# Patient Record
Sex: Male | Born: 1935
Health system: Southern US, Community
[De-identification: ages and names within clinical notes are randomized; demographics above are authoritative.]

## PROBLEM LIST (undated history)

## (undated) DIAGNOSIS — G4733 Obstructive sleep apnea (adult) (pediatric): Secondary | ICD-10-CM

## (undated) DIAGNOSIS — K5790 Diverticulosis of intestine, part unspecified, without perforation or abscess without bleeding: Secondary | ICD-10-CM

## (undated) DIAGNOSIS — I428 Other cardiomyopathies: Secondary | ICD-10-CM

## (undated) DIAGNOSIS — I519 Heart disease, unspecified: Secondary | ICD-10-CM

## (undated) DIAGNOSIS — I509 Heart failure, unspecified: Secondary | ICD-10-CM

## (undated) DIAGNOSIS — I82409 Acute embolism and thrombosis of unspecified deep veins of unspecified lower extremity: Secondary | ICD-10-CM

## (undated) DIAGNOSIS — E119 Type 2 diabetes mellitus without complications: Secondary | ICD-10-CM

## (undated) DIAGNOSIS — I639 Cerebral infarction, unspecified: Secondary | ICD-10-CM

## (undated) DIAGNOSIS — E785 Hyperlipidemia, unspecified: Secondary | ICD-10-CM

## (undated) DIAGNOSIS — L089 Local infection of the skin and subcutaneous tissue, unspecified: Secondary | ICD-10-CM

## (undated) DIAGNOSIS — N200 Calculus of kidney: Secondary | ICD-10-CM

## (undated) DIAGNOSIS — G459 Transient cerebral ischemic attack, unspecified: Secondary | ICD-10-CM

## (undated) HISTORY — DX: Calculus of kidney: N20.0

## (undated) HISTORY — DX: Cerebral infarction, unspecified: I63.9

## (undated) HISTORY — DX: Hyperlipidemia, unspecified: E78.5

## (undated) HISTORY — DX: Heart failure, unspecified: I50.9

## (undated) HISTORY — DX: Heart disease, unspecified: I51.9

## (undated) HISTORY — DX: Local infection of the skin and subcutaneous tissue, unspecified: L08.9

## (undated) HISTORY — PX: ROTATOR CUFF REPAIR: SHX139

## (undated) HISTORY — DX: Diverticulosis of intestine, part unspecified, without perforation or abscess without bleeding: K57.90

---

## 2000-11-17 ENCOUNTER — Ambulatory Visit (HOSPITAL_COMMUNITY): Admission: RE | Admit: 2000-11-17 | Discharge: 2000-11-17 | Payer: Self-pay | Admitting: Endocrinology

## 2002-09-02 ENCOUNTER — Ambulatory Visit (HOSPITAL_COMMUNITY): Admission: RE | Admit: 2002-09-02 | Discharge: 2002-09-02 | Payer: Self-pay | Admitting: Endocrinology

## 2006-07-14 DIAGNOSIS — G459 Transient cerebral ischemic attack, unspecified: Secondary | ICD-10-CM

## 2006-07-14 HISTORY — DX: Transient cerebral ischemic attack, unspecified: G45.9

## 2007-01-26 ENCOUNTER — Encounter: Admission: RE | Admit: 2007-01-26 | Discharge: 2007-01-26 | Payer: Self-pay | Admitting: Endocrinology

## 2007-02-03 ENCOUNTER — Encounter (INDEPENDENT_AMBULATORY_CARE_PROVIDER_SITE_OTHER): Payer: Self-pay | Admitting: Endocrinology

## 2007-02-03 ENCOUNTER — Ambulatory Visit (HOSPITAL_COMMUNITY): Admission: RE | Admit: 2007-02-03 | Discharge: 2007-02-03 | Payer: Self-pay | Admitting: Endocrinology

## 2007-02-03 ENCOUNTER — Ambulatory Visit: Payer: Self-pay | Admitting: Vascular Surgery

## 2009-02-22 ENCOUNTER — Emergency Department (HOSPITAL_COMMUNITY): Admission: EM | Admit: 2009-02-22 | Discharge: 2009-02-22 | Payer: Self-pay | Admitting: Emergency Medicine

## 2009-02-24 ENCOUNTER — Emergency Department (HOSPITAL_COMMUNITY): Admission: EM | Admit: 2009-02-24 | Discharge: 2009-02-24 | Payer: Self-pay | Admitting: Emergency Medicine

## 2009-03-09 ENCOUNTER — Emergency Department (HOSPITAL_COMMUNITY): Admission: EM | Admit: 2009-03-09 | Discharge: 2009-03-09 | Payer: Self-pay | Admitting: Emergency Medicine

## 2011-07-15 HISTORY — PX: COLONOSCOPY: SHX174

## 2011-08-20 DIAGNOSIS — Z7901 Long term (current) use of anticoagulants: Secondary | ICD-10-CM | POA: Diagnosis not present

## 2011-08-20 DIAGNOSIS — E119 Type 2 diabetes mellitus without complications: Secondary | ICD-10-CM | POA: Diagnosis not present

## 2011-08-20 DIAGNOSIS — E669 Obesity, unspecified: Secondary | ICD-10-CM | POA: Diagnosis not present

## 2011-08-20 DIAGNOSIS — E785 Hyperlipidemia, unspecified: Secondary | ICD-10-CM | POA: Diagnosis not present

## 2011-09-09 DIAGNOSIS — I82409 Acute embolism and thrombosis of unspecified deep veins of unspecified lower extremity: Secondary | ICD-10-CM | POA: Diagnosis not present

## 2011-09-09 DIAGNOSIS — Z7901 Long term (current) use of anticoagulants: Secondary | ICD-10-CM | POA: Diagnosis not present

## 2011-10-07 DIAGNOSIS — I82409 Acute embolism and thrombosis of unspecified deep veins of unspecified lower extremity: Secondary | ICD-10-CM | POA: Diagnosis not present

## 2011-10-07 DIAGNOSIS — Z7901 Long term (current) use of anticoagulants: Secondary | ICD-10-CM | POA: Diagnosis not present

## 2011-10-24 DIAGNOSIS — H2510 Age-related nuclear cataract, unspecified eye: Secondary | ICD-10-CM | POA: Diagnosis not present

## 2011-10-24 DIAGNOSIS — H524 Presbyopia: Secondary | ICD-10-CM | POA: Diagnosis not present

## 2011-10-24 DIAGNOSIS — H40059 Ocular hypertension, unspecified eye: Secondary | ICD-10-CM | POA: Diagnosis not present

## 2011-10-24 DIAGNOSIS — H52209 Unspecified astigmatism, unspecified eye: Secondary | ICD-10-CM | POA: Diagnosis not present

## 2011-11-04 DIAGNOSIS — I82409 Acute embolism and thrombosis of unspecified deep veins of unspecified lower extremity: Secondary | ICD-10-CM | POA: Diagnosis not present

## 2011-11-04 DIAGNOSIS — Z7901 Long term (current) use of anticoagulants: Secondary | ICD-10-CM | POA: Diagnosis not present

## 2011-12-11 DIAGNOSIS — Z7901 Long term (current) use of anticoagulants: Secondary | ICD-10-CM | POA: Diagnosis not present

## 2011-12-11 DIAGNOSIS — I82409 Acute embolism and thrombosis of unspecified deep veins of unspecified lower extremity: Secondary | ICD-10-CM | POA: Diagnosis not present

## 2012-01-13 DIAGNOSIS — Z7901 Long term (current) use of anticoagulants: Secondary | ICD-10-CM | POA: Diagnosis not present

## 2012-01-13 DIAGNOSIS — I82409 Acute embolism and thrombosis of unspecified deep veins of unspecified lower extremity: Secondary | ICD-10-CM | POA: Diagnosis not present

## 2012-02-16 DIAGNOSIS — E119 Type 2 diabetes mellitus without complications: Secondary | ICD-10-CM | POA: Diagnosis not present

## 2012-02-16 DIAGNOSIS — Z125 Encounter for screening for malignant neoplasm of prostate: Secondary | ICD-10-CM | POA: Diagnosis not present

## 2012-02-16 DIAGNOSIS — R82998 Other abnormal findings in urine: Secondary | ICD-10-CM | POA: Diagnosis not present

## 2012-02-16 DIAGNOSIS — E785 Hyperlipidemia, unspecified: Secondary | ICD-10-CM | POA: Diagnosis not present

## 2012-02-16 DIAGNOSIS — Z7901 Long term (current) use of anticoagulants: Secondary | ICD-10-CM | POA: Diagnosis not present

## 2012-02-23 DIAGNOSIS — Z1212 Encounter for screening for malignant neoplasm of rectum: Secondary | ICD-10-CM | POA: Diagnosis not present

## 2012-02-23 DIAGNOSIS — G459 Transient cerebral ischemic attack, unspecified: Secondary | ICD-10-CM | POA: Diagnosis not present

## 2012-02-23 DIAGNOSIS — J309 Allergic rhinitis, unspecified: Secondary | ICD-10-CM | POA: Diagnosis not present

## 2012-02-23 DIAGNOSIS — Z125 Encounter for screening for malignant neoplasm of prostate: Secondary | ICD-10-CM | POA: Diagnosis not present

## 2012-02-23 DIAGNOSIS — Z0000 Encounter for general adult medical examination without abnormal findings: Secondary | ICD-10-CM | POA: Diagnosis not present

## 2012-03-18 DIAGNOSIS — Z23 Encounter for immunization: Secondary | ICD-10-CM | POA: Diagnosis not present

## 2012-03-24 DIAGNOSIS — G459 Transient cerebral ischemic attack, unspecified: Secondary | ICD-10-CM | POA: Diagnosis not present

## 2012-03-24 DIAGNOSIS — I82409 Acute embolism and thrombosis of unspecified deep veins of unspecified lower extremity: Secondary | ICD-10-CM | POA: Diagnosis not present

## 2012-03-24 DIAGNOSIS — Z7901 Long term (current) use of anticoagulants: Secondary | ICD-10-CM | POA: Diagnosis not present

## 2012-03-31 DIAGNOSIS — B889 Infestation, unspecified: Secondary | ICD-10-CM | POA: Diagnosis not present

## 2012-03-31 DIAGNOSIS — R195 Other fecal abnormalities: Secondary | ICD-10-CM | POA: Diagnosis not present

## 2012-04-05 DIAGNOSIS — R195 Other fecal abnormalities: Secondary | ICD-10-CM | POA: Diagnosis not present

## 2012-04-05 DIAGNOSIS — B889 Infestation, unspecified: Secondary | ICD-10-CM | POA: Diagnosis not present

## 2012-04-21 DIAGNOSIS — Z7901 Long term (current) use of anticoagulants: Secondary | ICD-10-CM | POA: Diagnosis not present

## 2012-04-21 DIAGNOSIS — I82409 Acute embolism and thrombosis of unspecified deep veins of unspecified lower extremity: Secondary | ICD-10-CM | POA: Diagnosis not present

## 2012-04-27 DIAGNOSIS — R195 Other fecal abnormalities: Secondary | ICD-10-CM | POA: Diagnosis not present

## 2012-04-27 DIAGNOSIS — K222 Esophageal obstruction: Secondary | ICD-10-CM | POA: Diagnosis not present

## 2012-04-27 DIAGNOSIS — K319 Disease of stomach and duodenum, unspecified: Secondary | ICD-10-CM | POA: Diagnosis not present

## 2012-04-27 DIAGNOSIS — D126 Benign neoplasm of colon, unspecified: Secondary | ICD-10-CM | POA: Diagnosis not present

## 2012-05-26 DIAGNOSIS — Z7901 Long term (current) use of anticoagulants: Secondary | ICD-10-CM | POA: Diagnosis not present

## 2012-05-26 DIAGNOSIS — I82409 Acute embolism and thrombosis of unspecified deep veins of unspecified lower extremity: Secondary | ICD-10-CM | POA: Diagnosis not present

## 2012-05-26 DIAGNOSIS — G459 Transient cerebral ischemic attack, unspecified: Secondary | ICD-10-CM | POA: Diagnosis not present

## 2012-06-29 DIAGNOSIS — I82409 Acute embolism and thrombosis of unspecified deep veins of unspecified lower extremity: Secondary | ICD-10-CM | POA: Diagnosis not present

## 2012-06-29 DIAGNOSIS — G459 Transient cerebral ischemic attack, unspecified: Secondary | ICD-10-CM | POA: Diagnosis not present

## 2012-06-29 DIAGNOSIS — Z7901 Long term (current) use of anticoagulants: Secondary | ICD-10-CM | POA: Diagnosis not present

## 2012-08-03 DIAGNOSIS — Z7901 Long term (current) use of anticoagulants: Secondary | ICD-10-CM | POA: Diagnosis not present

## 2012-08-03 DIAGNOSIS — I82409 Acute embolism and thrombosis of unspecified deep veins of unspecified lower extremity: Secondary | ICD-10-CM | POA: Diagnosis not present

## 2012-08-03 DIAGNOSIS — G459 Transient cerebral ischemic attack, unspecified: Secondary | ICD-10-CM | POA: Diagnosis not present

## 2012-09-08 DIAGNOSIS — Z7901 Long term (current) use of anticoagulants: Secondary | ICD-10-CM | POA: Diagnosis not present

## 2012-09-08 DIAGNOSIS — I82409 Acute embolism and thrombosis of unspecified deep veins of unspecified lower extremity: Secondary | ICD-10-CM | POA: Diagnosis not present

## 2012-09-21 DIAGNOSIS — E669 Obesity, unspecified: Secondary | ICD-10-CM | POA: Diagnosis not present

## 2012-09-21 DIAGNOSIS — E785 Hyperlipidemia, unspecified: Secondary | ICD-10-CM | POA: Diagnosis not present

## 2012-09-21 DIAGNOSIS — Z7901 Long term (current) use of anticoagulants: Secondary | ICD-10-CM | POA: Diagnosis not present

## 2012-09-21 DIAGNOSIS — E119 Type 2 diabetes mellitus without complications: Secondary | ICD-10-CM | POA: Diagnosis not present

## 2012-09-21 DIAGNOSIS — K921 Melena: Secondary | ICD-10-CM | POA: Diagnosis not present

## 2012-10-06 DIAGNOSIS — Z7901 Long term (current) use of anticoagulants: Secondary | ICD-10-CM | POA: Diagnosis not present

## 2012-10-06 DIAGNOSIS — I82409 Acute embolism and thrombosis of unspecified deep veins of unspecified lower extremity: Secondary | ICD-10-CM | POA: Diagnosis not present

## 2012-10-06 DIAGNOSIS — G459 Transient cerebral ischemic attack, unspecified: Secondary | ICD-10-CM | POA: Diagnosis not present

## 2012-11-10 DIAGNOSIS — Z7901 Long term (current) use of anticoagulants: Secondary | ICD-10-CM | POA: Diagnosis not present

## 2012-11-10 DIAGNOSIS — I82409 Acute embolism and thrombosis of unspecified deep veins of unspecified lower extremity: Secondary | ICD-10-CM | POA: Diagnosis not present

## 2012-12-15 DIAGNOSIS — I82409 Acute embolism and thrombosis of unspecified deep veins of unspecified lower extremity: Secondary | ICD-10-CM | POA: Diagnosis not present

## 2012-12-15 DIAGNOSIS — Z7901 Long term (current) use of anticoagulants: Secondary | ICD-10-CM | POA: Diagnosis not present

## 2013-01-18 DIAGNOSIS — Z7901 Long term (current) use of anticoagulants: Secondary | ICD-10-CM | POA: Diagnosis not present

## 2013-01-18 DIAGNOSIS — I82409 Acute embolism and thrombosis of unspecified deep veins of unspecified lower extremity: Secondary | ICD-10-CM | POA: Diagnosis not present

## 2013-02-17 DIAGNOSIS — E1169 Type 2 diabetes mellitus with other specified complication: Secondary | ICD-10-CM | POA: Diagnosis not present

## 2013-02-17 DIAGNOSIS — Z7901 Long term (current) use of anticoagulants: Secondary | ICD-10-CM | POA: Diagnosis not present

## 2013-02-17 DIAGNOSIS — Z125 Encounter for screening for malignant neoplasm of prostate: Secondary | ICD-10-CM | POA: Diagnosis not present

## 2013-02-17 DIAGNOSIS — R82998 Other abnormal findings in urine: Secondary | ICD-10-CM | POA: Diagnosis not present

## 2013-02-17 DIAGNOSIS — E785 Hyperlipidemia, unspecified: Secondary | ICD-10-CM | POA: Diagnosis not present

## 2013-03-02 DIAGNOSIS — Z1212 Encounter for screening for malignant neoplasm of rectum: Secondary | ICD-10-CM | POA: Diagnosis not present

## 2013-03-04 DIAGNOSIS — Z7901 Long term (current) use of anticoagulants: Secondary | ICD-10-CM | POA: Diagnosis not present

## 2013-03-04 DIAGNOSIS — Z0000 Encounter for general adult medical examination without abnormal findings: Secondary | ICD-10-CM | POA: Diagnosis not present

## 2013-03-04 DIAGNOSIS — I82409 Acute embolism and thrombosis of unspecified deep veins of unspecified lower extremity: Secondary | ICD-10-CM | POA: Diagnosis not present

## 2013-03-04 DIAGNOSIS — G459 Transient cerebral ischemic attack, unspecified: Secondary | ICD-10-CM | POA: Diagnosis not present

## 2013-03-04 DIAGNOSIS — E669 Obesity, unspecified: Secondary | ICD-10-CM | POA: Diagnosis not present

## 2013-03-04 DIAGNOSIS — E785 Hyperlipidemia, unspecified: Secondary | ICD-10-CM | POA: Diagnosis not present

## 2013-03-04 DIAGNOSIS — Z1331 Encounter for screening for depression: Secondary | ICD-10-CM | POA: Diagnosis not present

## 2013-03-04 DIAGNOSIS — J309 Allergic rhinitis, unspecified: Secondary | ICD-10-CM | POA: Diagnosis not present

## 2013-03-04 DIAGNOSIS — E119 Type 2 diabetes mellitus without complications: Secondary | ICD-10-CM | POA: Diagnosis not present

## 2013-03-04 DIAGNOSIS — N401 Enlarged prostate with lower urinary tract symptoms: Secondary | ICD-10-CM | POA: Diagnosis not present

## 2013-03-23 DIAGNOSIS — I82409 Acute embolism and thrombosis of unspecified deep veins of unspecified lower extremity: Secondary | ICD-10-CM | POA: Diagnosis not present

## 2013-03-23 DIAGNOSIS — Z7901 Long term (current) use of anticoagulants: Secondary | ICD-10-CM | POA: Diagnosis not present

## 2013-03-30 DIAGNOSIS — Z23 Encounter for immunization: Secondary | ICD-10-CM | POA: Diagnosis not present

## 2013-05-10 DIAGNOSIS — I82409 Acute embolism and thrombosis of unspecified deep veins of unspecified lower extremity: Secondary | ICD-10-CM | POA: Diagnosis not present

## 2013-05-10 DIAGNOSIS — Z7901 Long term (current) use of anticoagulants: Secondary | ICD-10-CM | POA: Diagnosis not present

## 2013-06-14 DIAGNOSIS — I82409 Acute embolism and thrombosis of unspecified deep veins of unspecified lower extremity: Secondary | ICD-10-CM | POA: Diagnosis not present

## 2013-06-14 DIAGNOSIS — Z7901 Long term (current) use of anticoagulants: Secondary | ICD-10-CM | POA: Diagnosis not present

## 2013-07-21 DIAGNOSIS — Z7901 Long term (current) use of anticoagulants: Secondary | ICD-10-CM | POA: Diagnosis not present

## 2013-07-21 DIAGNOSIS — I82409 Acute embolism and thrombosis of unspecified deep veins of unspecified lower extremity: Secondary | ICD-10-CM | POA: Diagnosis not present

## 2013-08-23 DIAGNOSIS — I82409 Acute embolism and thrombosis of unspecified deep veins of unspecified lower extremity: Secondary | ICD-10-CM | POA: Diagnosis not present

## 2013-08-23 DIAGNOSIS — Z7901 Long term (current) use of anticoagulants: Secondary | ICD-10-CM | POA: Diagnosis not present

## 2013-09-05 DIAGNOSIS — N401 Enlarged prostate with lower urinary tract symptoms: Secondary | ICD-10-CM | POA: Diagnosis not present

## 2013-09-05 DIAGNOSIS — E785 Hyperlipidemia, unspecified: Secondary | ICD-10-CM | POA: Diagnosis not present

## 2013-09-05 DIAGNOSIS — E669 Obesity, unspecified: Secondary | ICD-10-CM | POA: Diagnosis not present

## 2013-09-05 DIAGNOSIS — I6990 Unspecified sequelae of unspecified cerebrovascular disease: Secondary | ICD-10-CM | POA: Diagnosis not present

## 2013-09-05 DIAGNOSIS — Z6835 Body mass index (BMI) 35.0-35.9, adult: Secondary | ICD-10-CM | POA: Diagnosis not present

## 2013-09-05 DIAGNOSIS — Z7901 Long term (current) use of anticoagulants: Secondary | ICD-10-CM | POA: Diagnosis not present

## 2013-09-05 DIAGNOSIS — E119 Type 2 diabetes mellitus without complications: Secondary | ICD-10-CM | POA: Diagnosis not present

## 2013-09-05 DIAGNOSIS — J309 Allergic rhinitis, unspecified: Secondary | ICD-10-CM | POA: Diagnosis not present

## 2013-09-20 DIAGNOSIS — Z7901 Long term (current) use of anticoagulants: Secondary | ICD-10-CM | POA: Diagnosis not present

## 2013-09-20 DIAGNOSIS — I82409 Acute embolism and thrombosis of unspecified deep veins of unspecified lower extremity: Secondary | ICD-10-CM | POA: Diagnosis not present

## 2013-09-20 DIAGNOSIS — I6990 Unspecified sequelae of unspecified cerebrovascular disease: Secondary | ICD-10-CM | POA: Diagnosis not present

## 2013-10-19 DIAGNOSIS — I6990 Unspecified sequelae of unspecified cerebrovascular disease: Secondary | ICD-10-CM | POA: Diagnosis not present

## 2013-10-19 DIAGNOSIS — Z7901 Long term (current) use of anticoagulants: Secondary | ICD-10-CM | POA: Diagnosis not present

## 2013-10-19 DIAGNOSIS — I82409 Acute embolism and thrombosis of unspecified deep veins of unspecified lower extremity: Secondary | ICD-10-CM | POA: Diagnosis not present

## 2013-11-16 DIAGNOSIS — I6990 Unspecified sequelae of unspecified cerebrovascular disease: Secondary | ICD-10-CM | POA: Diagnosis not present

## 2013-11-16 DIAGNOSIS — Z7901 Long term (current) use of anticoagulants: Secondary | ICD-10-CM | POA: Diagnosis not present

## 2013-11-16 DIAGNOSIS — I82409 Acute embolism and thrombosis of unspecified deep veins of unspecified lower extremity: Secondary | ICD-10-CM | POA: Diagnosis not present

## 2013-12-21 DIAGNOSIS — Z7901 Long term (current) use of anticoagulants: Secondary | ICD-10-CM | POA: Diagnosis not present

## 2013-12-21 DIAGNOSIS — R82998 Other abnormal findings in urine: Secondary | ICD-10-CM | POA: Diagnosis not present

## 2013-12-21 DIAGNOSIS — I82409 Acute embolism and thrombosis of unspecified deep veins of unspecified lower extremity: Secondary | ICD-10-CM | POA: Diagnosis not present

## 2014-01-04 DIAGNOSIS — I82409 Acute embolism and thrombosis of unspecified deep veins of unspecified lower extremity: Secondary | ICD-10-CM | POA: Diagnosis not present

## 2014-01-04 DIAGNOSIS — N200 Calculus of kidney: Secondary | ICD-10-CM | POA: Diagnosis not present

## 2014-01-04 DIAGNOSIS — Z7901 Long term (current) use of anticoagulants: Secondary | ICD-10-CM | POA: Diagnosis not present

## 2014-01-05 DIAGNOSIS — N200 Calculus of kidney: Secondary | ICD-10-CM | POA: Diagnosis not present

## 2014-01-11 ENCOUNTER — Other Ambulatory Visit: Payer: Self-pay | Admitting: Internal Medicine

## 2014-01-11 DIAGNOSIS — N200 Calculus of kidney: Secondary | ICD-10-CM

## 2014-01-12 ENCOUNTER — Ambulatory Visit
Admission: RE | Admit: 2014-01-12 | Discharge: 2014-01-12 | Disposition: A | Discharge status: Home / Self Care | Payer: Medicare Other | Source: Ambulatory Visit | Attending: Internal Medicine | Admitting: Internal Medicine

## 2014-01-12 DIAGNOSIS — N200 Calculus of kidney: Secondary | ICD-10-CM

## 2014-01-12 DIAGNOSIS — K5730 Diverticulosis of large intestine without perforation or abscess without bleeding: Secondary | ICD-10-CM | POA: Diagnosis not present

## 2014-01-19 DIAGNOSIS — Z7901 Long term (current) use of anticoagulants: Secondary | ICD-10-CM | POA: Diagnosis not present

## 2014-01-19 DIAGNOSIS — I82409 Acute embolism and thrombosis of unspecified deep veins of unspecified lower extremity: Secondary | ICD-10-CM | POA: Diagnosis not present

## 2014-01-19 DIAGNOSIS — I6990 Unspecified sequelae of unspecified cerebrovascular disease: Secondary | ICD-10-CM | POA: Diagnosis not present

## 2014-03-06 DIAGNOSIS — E119 Type 2 diabetes mellitus without complications: Secondary | ICD-10-CM | POA: Diagnosis not present

## 2014-03-06 DIAGNOSIS — Z125 Encounter for screening for malignant neoplasm of prostate: Secondary | ICD-10-CM | POA: Diagnosis not present

## 2014-03-06 DIAGNOSIS — Z7901 Long term (current) use of anticoagulants: Secondary | ICD-10-CM | POA: Diagnosis not present

## 2014-03-06 DIAGNOSIS — I82409 Acute embolism and thrombosis of unspecified deep veins of unspecified lower extremity: Secondary | ICD-10-CM | POA: Diagnosis not present

## 2014-03-06 DIAGNOSIS — E785 Hyperlipidemia, unspecified: Secondary | ICD-10-CM | POA: Diagnosis not present

## 2014-03-10 DIAGNOSIS — Z1212 Encounter for screening for malignant neoplasm of rectum: Secondary | ICD-10-CM | POA: Diagnosis not present

## 2014-03-13 DIAGNOSIS — Z0000 Encounter for general adult medical examination without abnormal findings: Secondary | ICD-10-CM | POA: Diagnosis not present

## 2014-03-13 DIAGNOSIS — E785 Hyperlipidemia, unspecified: Secondary | ICD-10-CM | POA: Diagnosis not present

## 2014-03-13 DIAGNOSIS — Z7901 Long term (current) use of anticoagulants: Secondary | ICD-10-CM | POA: Diagnosis not present

## 2014-03-13 DIAGNOSIS — E669 Obesity, unspecified: Secondary | ICD-10-CM | POA: Diagnosis not present

## 2014-03-13 DIAGNOSIS — Z1331 Encounter for screening for depression: Secondary | ICD-10-CM | POA: Diagnosis not present

## 2014-03-13 DIAGNOSIS — I82409 Acute embolism and thrombosis of unspecified deep veins of unspecified lower extremity: Secondary | ICD-10-CM | POA: Diagnosis not present

## 2014-03-13 DIAGNOSIS — K5730 Diverticulosis of large intestine without perforation or abscess without bleeding: Secondary | ICD-10-CM | POA: Diagnosis not present

## 2014-03-13 DIAGNOSIS — E119 Type 2 diabetes mellitus without complications: Secondary | ICD-10-CM | POA: Diagnosis not present

## 2014-03-13 DIAGNOSIS — N200 Calculus of kidney: Secondary | ICD-10-CM | POA: Diagnosis not present

## 2014-04-03 DIAGNOSIS — Z23 Encounter for immunization: Secondary | ICD-10-CM | POA: Diagnosis not present

## 2014-04-11 DIAGNOSIS — I82409 Acute embolism and thrombosis of unspecified deep veins of unspecified lower extremity: Secondary | ICD-10-CM | POA: Diagnosis not present

## 2014-04-11 DIAGNOSIS — Z7901 Long term (current) use of anticoagulants: Secondary | ICD-10-CM | POA: Diagnosis not present

## 2014-05-10 DIAGNOSIS — Z7901 Long term (current) use of anticoagulants: Secondary | ICD-10-CM | POA: Diagnosis not present

## 2014-05-10 DIAGNOSIS — I82403 Acute embolism and thrombosis of unspecified deep veins of lower extremity, bilateral: Secondary | ICD-10-CM | POA: Diagnosis not present

## 2014-06-14 DIAGNOSIS — Z7901 Long term (current) use of anticoagulants: Secondary | ICD-10-CM | POA: Diagnosis not present

## 2014-06-14 DIAGNOSIS — I82403 Acute embolism and thrombosis of unspecified deep veins of lower extremity, bilateral: Secondary | ICD-10-CM | POA: Diagnosis not present

## 2014-07-20 DIAGNOSIS — I82403 Acute embolism and thrombosis of unspecified deep veins of lower extremity, bilateral: Secondary | ICD-10-CM | POA: Diagnosis not present

## 2014-07-20 DIAGNOSIS — Z7901 Long term (current) use of anticoagulants: Secondary | ICD-10-CM | POA: Diagnosis not present

## 2014-08-24 DIAGNOSIS — I82403 Acute embolism and thrombosis of unspecified deep veins of lower extremity, bilateral: Secondary | ICD-10-CM | POA: Diagnosis not present

## 2014-08-24 DIAGNOSIS — Z7901 Long term (current) use of anticoagulants: Secondary | ICD-10-CM | POA: Diagnosis not present

## 2014-09-11 DIAGNOSIS — Z1389 Encounter for screening for other disorder: Secondary | ICD-10-CM | POA: Diagnosis not present

## 2014-09-11 DIAGNOSIS — I6990 Unspecified sequelae of unspecified cerebrovascular disease: Secondary | ICD-10-CM | POA: Diagnosis not present

## 2014-09-11 DIAGNOSIS — Z6834 Body mass index (BMI) 34.0-34.9, adult: Secondary | ICD-10-CM | POA: Diagnosis not present

## 2014-09-11 DIAGNOSIS — N401 Enlarged prostate with lower urinary tract symptoms: Secondary | ICD-10-CM | POA: Diagnosis not present

## 2014-09-11 DIAGNOSIS — E785 Hyperlipidemia, unspecified: Secondary | ICD-10-CM | POA: Diagnosis not present

## 2014-09-11 DIAGNOSIS — E669 Obesity, unspecified: Secondary | ICD-10-CM | POA: Diagnosis not present

## 2014-09-11 DIAGNOSIS — Z7901 Long term (current) use of anticoagulants: Secondary | ICD-10-CM | POA: Diagnosis not present

## 2014-09-11 DIAGNOSIS — E119 Type 2 diabetes mellitus without complications: Secondary | ICD-10-CM | POA: Diagnosis not present

## 2014-09-26 DIAGNOSIS — I6990 Unspecified sequelae of unspecified cerebrovascular disease: Secondary | ICD-10-CM | POA: Diagnosis not present

## 2014-09-26 DIAGNOSIS — I82403 Acute embolism and thrombosis of unspecified deep veins of lower extremity, bilateral: Secondary | ICD-10-CM | POA: Diagnosis not present

## 2014-09-26 DIAGNOSIS — Z7901 Long term (current) use of anticoagulants: Secondary | ICD-10-CM | POA: Diagnosis not present

## 2014-10-31 DIAGNOSIS — I82403 Acute embolism and thrombosis of unspecified deep veins of lower extremity, bilateral: Secondary | ICD-10-CM | POA: Diagnosis not present

## 2014-10-31 DIAGNOSIS — Z7901 Long term (current) use of anticoagulants: Secondary | ICD-10-CM | POA: Diagnosis not present

## 2014-11-28 ENCOUNTER — Encounter (HOSPITAL_COMMUNITY): Payer: Self-pay

## 2014-11-28 ENCOUNTER — Emergency Department (HOSPITAL_COMMUNITY): Payer: Medicare Other

## 2014-11-28 ENCOUNTER — Inpatient Hospital Stay (HOSPITAL_COMMUNITY)
Admission: EM | Admit: 2014-11-28 | Discharge: 2014-12-02 | DRG: 287 | Disposition: A | Discharge status: Home / Self Care | Payer: Medicare Other | Attending: Internal Medicine | Admitting: Internal Medicine

## 2014-11-28 DIAGNOSIS — I82401 Acute embolism and thrombosis of unspecified deep veins of right lower extremity: Secondary | ICD-10-CM

## 2014-11-28 DIAGNOSIS — I5021 Acute systolic (congestive) heart failure: Secondary | ICD-10-CM | POA: Diagnosis not present

## 2014-11-28 DIAGNOSIS — R7989 Other specified abnormal findings of blood chemistry: Secondary | ICD-10-CM | POA: Insufficient documentation

## 2014-11-28 DIAGNOSIS — I82403 Acute embolism and thrombosis of unspecified deep veins of lower extremity, bilateral: Secondary | ICD-10-CM | POA: Diagnosis not present

## 2014-11-28 DIAGNOSIS — I252 Old myocardial infarction: Secondary | ICD-10-CM

## 2014-11-28 DIAGNOSIS — I429 Cardiomyopathy, unspecified: Secondary | ICD-10-CM | POA: Diagnosis present

## 2014-11-28 DIAGNOSIS — Z888 Allergy status to other drugs, medicaments and biological substances status: Secondary | ICD-10-CM

## 2014-11-28 DIAGNOSIS — I5043 Acute on chronic combined systolic (congestive) and diastolic (congestive) heart failure: Secondary | ICD-10-CM | POA: Diagnosis not present

## 2014-11-28 DIAGNOSIS — I472 Ventricular tachycardia: Secondary | ICD-10-CM | POA: Diagnosis present

## 2014-11-28 DIAGNOSIS — Z7982 Long term (current) use of aspirin: Secondary | ICD-10-CM

## 2014-11-28 DIAGNOSIS — Z8673 Personal history of transient ischemic attack (TIA), and cerebral infarction without residual deficits: Secondary | ICD-10-CM | POA: Diagnosis not present

## 2014-11-28 DIAGNOSIS — R778 Other specified abnormalities of plasma proteins: Secondary | ICD-10-CM | POA: Insufficient documentation

## 2014-11-28 DIAGNOSIS — I509 Heart failure, unspecified: Secondary | ICD-10-CM

## 2014-11-28 DIAGNOSIS — Z91041 Radiographic dye allergy status: Secondary | ICD-10-CM

## 2014-11-28 DIAGNOSIS — Z882 Allergy status to sulfonamides status: Secondary | ICD-10-CM | POA: Diagnosis not present

## 2014-11-28 DIAGNOSIS — Z86718 Personal history of other venous thrombosis and embolism: Secondary | ICD-10-CM | POA: Diagnosis not present

## 2014-11-28 DIAGNOSIS — I2510 Atherosclerotic heart disease of native coronary artery without angina pectoris: Secondary | ICD-10-CM | POA: Diagnosis present

## 2014-11-28 DIAGNOSIS — G459 Transient cerebral ischemic attack, unspecified: Secondary | ICD-10-CM | POA: Diagnosis not present

## 2014-11-28 DIAGNOSIS — E785 Hyperlipidemia, unspecified: Secondary | ICD-10-CM | POA: Diagnosis present

## 2014-11-28 DIAGNOSIS — R079 Chest pain, unspecified: Secondary | ICD-10-CM | POA: Diagnosis not present

## 2014-11-28 DIAGNOSIS — Z7901 Long term (current) use of anticoagulants: Secondary | ICD-10-CM

## 2014-11-28 DIAGNOSIS — E119 Type 2 diabetes mellitus without complications: Secondary | ICD-10-CM

## 2014-11-28 DIAGNOSIS — R0602 Shortness of breath: Secondary | ICD-10-CM | POA: Diagnosis not present

## 2014-11-28 DIAGNOSIS — I82409 Acute embolism and thrombosis of unspecified deep veins of unspecified lower extremity: Secondary | ICD-10-CM | POA: Diagnosis present

## 2014-11-28 DIAGNOSIS — I214 Non-ST elevation (NSTEMI) myocardial infarction: Secondary | ICD-10-CM | POA: Diagnosis not present

## 2014-11-28 DIAGNOSIS — I420 Dilated cardiomyopathy: Secondary | ICD-10-CM | POA: Diagnosis not present

## 2014-11-28 DIAGNOSIS — I5041 Acute combined systolic (congestive) and diastolic (congestive) heart failure: Secondary | ICD-10-CM | POA: Diagnosis not present

## 2014-11-28 HISTORY — DX: Type 2 diabetes mellitus without complications: E11.9

## 2014-11-28 HISTORY — DX: Acute embolism and thrombosis of unspecified deep veins of unspecified lower extremity: I82.409

## 2014-11-28 HISTORY — DX: Transient cerebral ischemic attack, unspecified: G45.9

## 2014-11-28 LAB — BASIC METABOLIC PANEL
Anion gap: 12 (ref 5–15)
BUN: 20 mg/dL (ref 6–20)
CO2: 23 mmol/L (ref 22–32)
Calcium: 9.3 mg/dL (ref 8.9–10.3)
Chloride: 103 mmol/L (ref 101–111)
Creatinine, Ser: 1.11 mg/dL (ref 0.61–1.24)
GFR calc Af Amer: >60 mL/min (ref >60)
GLUCOSE: 121 mg/dL — AB (ref 65–99)
POTASSIUM: 4.3 mmol/L (ref 3.5–5.1)
Sodium: 138 mmol/L (ref 135–145)

## 2014-11-28 LAB — CBC
HCT: 43.5 % (ref 39.0–52.0)
HEMOGLOBIN: 14.4 g/dL (ref 13.0–17.0)
MCH: 26.5 pg (ref 26.0–34.0)
MCHC: 33.1 g/dL (ref 30.0–36.0)
MCV: 80 fL (ref 78.0–100.0)
Platelets: 202 10*3/uL (ref 150–400)
RBC: 5.44 MIL/uL (ref 4.22–5.81)
RDW: 13.6 % (ref 11.5–15.5)
WBC: 7.5 10*3/uL (ref 4.0–10.5)

## 2014-11-28 LAB — PROTIME-INR
INR: 2.79 — ABNORMAL HIGH (ref 0.00–1.49)
PROTHROMBIN TIME: 29.6 s — AB (ref 11.6–15.2)

## 2014-11-28 LAB — I-STAT TROPONIN, ED: TROPONIN I, POC: 0.04 ng/mL (ref 0.00–0.08)

## 2014-11-28 LAB — BRAIN NATRIURETIC PEPTIDE: B Natriuretic Peptide: 732.6 pg/mL — ABNORMAL HIGH (ref 0.0–100.0)

## 2014-11-28 MED ORDER — FUROSEMIDE 10 MG/ML IJ SOLN
20.0000 mg | Freq: Once | INTRAMUSCULAR | Status: AC
  Administered 2014-11-28: 20 mg via INTRAVENOUS
  Filled 2014-11-28: qty 2

## 2014-11-28 NOTE — ED Provider Notes (Signed)
CSN: [id number]     Arrival date & time 11/28/14  1830 History   First MD Initiated Contact with Patient 11/28/14 2126     Chief Complaint  Patient presents with  . Shortness of Breath  . Chest Pain     Patient is a 80 y.o. male presenting with shortness of breath and chest pain. The history is provided by the patient. No language interpreter was used.  Shortness of Breath Associated symptoms: chest pain   Chest Pain Associated symptoms: shortness of breath    Michael Randolph presents for evaluation of SOB.  He reports 10 days of progressive SOB and DOE.  He has orthopnea at night.  He has a cough productive of yellow sputum.  He denies any fevers.  He has left sided chest tightness when he is particularly SOB.  He has chronic lower extremity edema related to prior DVTs and is on chronic coumadin therapy.  Sxs are moderate, constant, worsening.  He denies any hx/o lung disease, CHF, tobacco use.    Past Medical History  Diagnosis Date  . DVT (deep venous thrombosis)   . Diabetes mellitus without complication     no medications  . TIA (transient ischemic attack) 2008    was seen on CT    Past Surgical History  Procedure Laterality Date  . Rotator cuff repair Right    History reviewed. No pertinent family history. History  Substance Use Topics  . Smoking status: Never Smoker   . Smokeless tobacco: Not on file  . Alcohol Use: No    Review of Systems  Respiratory: Positive for shortness of breath.   Cardiovascular: Positive for chest pain.  All other systems reviewed and are negative.     Allergies  Actos; Sulfa antibiotics; and Dye fdc red  Home Medications   Prior to Admission medications   Medication Sig Start Date End Date Taking? Authorizing Provider  aspirin 81 MG tablet Take 81 mg by mouth daily.   Yes Historical Provider, MD  Cholecalciferol (VITAMIN D-3) 1000 UNITS CAPS Take 2 capsules by mouth daily.   Yes Historical Provider, MD  GARLIC PO Take 1 tablet by  mouth daily.   Yes Historical Provider, MD  warfarin (COUMADIN) 5 MG tablet Take 2.5-5 mg by mouth daily. Take a whole tablet every day except on mondays and fridays take 2.5mg    Yes Historical Provider, MD   BP 147/94 mmHg  Pulse 108  Temp(Src) 97.9 F (36.6 C) (Oral)  Resp 21  Ht 5' 7.5" (1.715 m)  Wt 216 lb 12.8 oz (98.34 kg)  BMI 33.44 kg/m2  SpO2 97% Physical Exam  Constitutional: He is oriented to person, place, and time. He appears well-developed and well-nourished.  HENT:  Head: Normocephalic and atraumatic.  Cardiovascular: Normal rate and regular rhythm.   No murmur heard. Pulmonary/Chest: Effort normal. No respiratory distress.  Diffuse wheezes with bibasilar rales  Abdominal: Soft. There is no tenderness. There is no rebound and no guarding.  Musculoskeletal: He exhibits no tenderness.  1+ pitting edema in BLE.   Neurological: He is alert and oriented to person, place, and time.  Skin: Skin is warm and dry.  Psychiatric: He has a normal mood and affect. His behavior is normal.  Nursing note and vitals reviewed.   ED Course  Procedures (including critical care time) Labs Review Labs Reviewed  BASIC METABOLIC PANEL - Abnormal; Notable for the following:    Glucose, Bld 121 (*)    All other components within  normal limits  BRAIN NATRIURETIC PEPTIDE - Abnormal; Notable for the following:    B Natriuretic Peptide 732.6 (*)    All other components within normal limits  PROTIME-INR - Abnormal; Notable for the following:    Prothrombin Time 29.6 (*)    INR 2.79 (*)    All other components within normal limits  GLUCOSE, CAPILLARY - Abnormal; Notable for the following:    Glucose-Capillary 127 (*)    All other components within normal limits  CBC  HEMOGLOBIN A1C  TSH  LIPID PANEL  BASIC METABOLIC PANEL  TROPONIN I  TROPONIN I  TROPONIN I  MAGNESIUM  PROTIME-INR  I-STAT TROPOININ, ED  CBG MONITORING, ED    Imaging Review Dg Chest 2 View  11/28/2014    CLINICAL DATA:  LEFT side chest pain and shortness of breath for 10 days, history CHF, hypertension, diabetes  EXAM: CHEST  2 VIEW  COMPARISON:  None  FINDINGS: Slight rotation to the RIGHT on PA view.  Enlargement of cardiac silhouette.  Slight pulmonary vascular congestion.  Mediastinal contours normal.  Diffuse interstitial infiltrates with suspect small bibasilar effusions question pulmonary edema/CHF.  Prominence of RIGHT hilum identified.  No pneumothorax.  Bones demineralized with scattered endplate spur formation thoracic spine.  IMPRESSION: Enlargement of cardiac silhouette with slight vascular congestion and scattered interstitial infiltrates stools due with type bibasilar effusions, favoring CHF.  RIGHT hilum is prominent, though this may be in part related to slight rotation to the RIGHT ; recommend followup radiographs in 2-4 weeks to reassess and exclude hilar mass/adenopathy.   Electronically Signed   By: Lavonia Dana M.D.   On: 11/28/2014 21:55     EKG Interpretation   Date/Time:  Tuesday Nov 28 2014 18:37:25 EDT Ventricular Rate:  94 PR Interval:  176 QRS Duration: 96 QT Interval:  372 QTC Calculation: 465 R Axis:   40 Text Interpretation:  Undetermined rhythm Low voltage QRS ST \\T \ T wave  abnormality, consider lateral ischemia Abnormal ECG multiple PVCs  Confirmed by Michael Randolph [phone]) on 11/28/2014 9:27:07 PM      MDM   Final diagnoses:  Acute congestive heart failure, unspecified congestive heart failure type  DVT (deep venous thrombosis), right  Transient cerebral ischemia, unspecified transient cerebral ischemia type    Pt here for evaluation of progressive SOB, orthopnea.  Pt appears mildly volume overloaded on exam and imagining.  EKG with multiple PVCs.  Discussed with pt findings of new onset CHF, providing lasix and plan to admit to medicine for diuresis and further work up.  Pt with hx/o DVT, coumadin is therapeutic.     Quintella Reichert, MD 11/29/14 [phone]

## 2014-11-28 NOTE — ED Notes (Signed)
Pt reports has chronic shortness of breath, has been worse past 4-5 days, unable to lay flat.  Pt talking in complete sentences.  Left chest pain x 4-5 days, intermittant.

## 2014-11-28 NOTE — Progress Notes (Signed)
Patient transferred from the ED via stretcher to room 3E04. Oriented and Educated patient to room equipment and the heart failure floor. VSS. Currently patient states does not have any chest pain or any pain when asked. Verified telemetry box and placement of telemetry leads on patient with CMT. Will continue to monitor patient to end of shift.

## 2014-11-28 NOTE — H&P (Signed)
[facility] History and Physical  Michael Randolph NWG:[id number] DOB: 1935-01-19 DOA: 11/28/2014  Referring physician: ED physician PCP: [name], MD  Specialists:   Chief Complaint: SOB and cough  HPI: Michael Randolph is a 80 y.o. male with PMH of diet controlled DM,  Hx of DVT on Coumadin, hx of TIA, who presents with sob and cough  Patient report that he has been having SOB for about 9 days. He has left sided chest tightness. He has orthopnea at night.  He has cough productive of yellow sputum. He has chronic leg edema due to DVT. Currently patient denies fever, chills, running nose, ear pain, headaches, abdominal pain, diarrhea, constipation, dysuria, urgency, frequency, hematuria, skin rashes. No unilateral weakness, numbness or tingling sensations. No vision change or hearing loss.  In ED, patient was found to have BMP 732.6, negative troponin, and INR 2.79, temperature normal, tachycardia, renal function okay. Chest x-ray is suggestive for congestive heart failure.  Where does patient live?   At home    Can patient participate in ADLs?   Some   Review of Systems:   General: no fevers, chills, no changes in body weight, has poor appetite, has fatigue HEENT: no blurry vision, hearing changes or sore throat Pulm: has dyspnea, coughing, wheezing CV: has chest pain, No palpitations Abd: no nausea, vomiting, abdominal pain, diarrhea, constipation GU: no dysuria, burning on urination, increased urinary frequency, hematuria  Ext: has leg edema Neuro: no unilateral weakness, numbness, or tingling, no vision change or hearing loss Skin: no rash MSK: No muscle spasm, no deformity, no limitation of range of movement in spin Heme: No easy bruising.  Travel history: No recent long distant travel.  Allergy:  Allergies  Allergen Reactions  . Actos [Pioglitazone] Other (See Comments)    fatigue  . Sulfa Antibiotics Other (See Comments)    hallucinations  . Dye Fdc Red [Red  Dye] Rash    # 40    Past Medical History  Diagnosis Date  . DVT (deep venous thrombosis)   . Diabetes mellitus without complication     no medications  . TIA (transient ischemic attack) 2008    was seen on CT     Past Surgical History  Procedure Laterality Date  . Rotator cuff repair Right     Social History:  reports that he has never smoked. He does not have any smokeless tobacco history on file. He reports that he does not drink alcohol or use illicit drugs.  Family History:  Family History  Problem Relation Age of Onset  . Stroke Mother   . Hypertension Mother   . Heart attack Father   . Crohn's disease Brother   . Schizophrenia Sister      Prior to Admission medications   Medication Sig Start Date End Date Taking? Authorizing Provider  aspirin 81 MG tablet Take 81 mg by mouth daily.   Yes Historical Provider, MD  Cholecalciferol (VITAMIN D-3) 1000 UNITS CAPS Take 2 capsules by mouth daily.   Yes Historical Provider, MD  GARLIC PO Take 1 tablet by mouth daily.   Yes Historical Provider, MD  warfarin (COUMADIN) 5 MG tablet Take 2.5-5 mg by mouth daily. Take a whole tablet every day except on mondays and fridays take 2.5mg    Yes Historical Provider, MD    Physical Exam: Filed Vitals:   11/28/14 2315 11/29/14 0005 11/29/14 0027 11/29/14 0639  BP: 109/94 111/75  110/79  Pulse: 86 84  82  Temp:  98.4 F (36.9 C)  98 F (36.7 C)  TempSrc:  Oral  Oral  Resp: 17 19  18   Height:  5\' 7"  (1.702 m)    Weight:   96.48 kg (212 lb 11.2 oz)   SpO2: 95% 97%  97%   General: Not in acute distress HEENT:       Eyes: PERRL, EOMI, no scleral icterus.       ENT: No discharge from the ears and nose, no pharynx injection, no tonsillar enlargement.        Neck: positve JVD, no bruit, no mass felt. Heme: No neck lymph node enlargement. Cardiac: S1/S2, RRR, No murmurs, No gallops or rubs. Pulm: has wheezing and rales bilaterally, No rubs. Abd: Soft, nondistended, nontender, no  rebound pain, no organomegaly, BS present. Ext: has 1+ pitting leg edema bilaterally. 2+DP/PT pulse bilaterally. Musculoskeletal: No joint deformities, No joint redness or warmth, no limitation of ROM in spin. Skin: No rashes.  Neuro: Alert, oriented X3, cranial nerves II-XII grossly intact, muscle strength 5/5 in all extremities, sensation to light touch intact.  Psych: Patient is not psychotic, no suicidal or hemocidal ideation.  Labs on Admission:  Basic Metabolic Panel:  Recent Labs Lab 11/28/14 1851 11/29/14 0557  NA 138 137  K 4.3 4.2  CL 103 101  CO2 23 28  GLUCOSE 121* 126*  BUN 20 18  CREATININE 1.11 1.21  CALCIUM 9.3 9.6  MG  --  2.2   Liver Function Tests: No results for input(s): AST, ALT, ALKPHOS, BILITOT, PROT, ALBUMIN in the last 168 hours. No results for input(s): LIPASE, AMYLASE in the last 168 hours. No results for input(s): AMMONIA in the last 168 hours. CBC:  Recent Labs Lab 11/28/14 1851  WBC 7.5  HGB 14.4  HCT 43.5  MCV 80.0  PLT 202   Cardiac Enzymes:  Recent Labs Lab 11/29/14 0029 11/29/14 0557  TROPONINI 0.06* 0.06*    BNP (last 3 results)  Recent Labs  11/28/14 1851  BNP 732.6*    ProBNP (last 3 results) No results for input(s): PROBNP in the last 8760 hours.  CBG:  Recent Labs Lab 11/29/14 0045 11/29/14 0639  GLUCAP 127* 126*    Radiological Exams on Admission: Dg Chest 2 View  11/28/2014   CLINICAL DATA:  LEFT side chest pain and shortness of breath for 10 days, history CHF, hypertension, diabetes  EXAM: CHEST  2 VIEW  COMPARISON:  None  FINDINGS: Slight rotation to the RIGHT on PA view.  Enlargement of cardiac silhouette.  Slight pulmonary vascular congestion.  Mediastinal contours normal.  Diffuse interstitial infiltrates with suspect small bibasilar effusions question pulmonary edema/CHF.  Prominence of RIGHT hilum identified.  No pneumothorax.  Bones demineralized with scattered endplate spur formation thoracic  spine.  IMPRESSION: Enlargement of cardiac silhouette with slight vascular congestion and scattered interstitial infiltrates stools due with type bibasilar effusions, favoring CHF.  RIGHT hilum is prominent, though this may be in part related to slight rotation to the RIGHT ; recommend followup radiographs in 2-4 weeks to reassess and exclude hilar mass/adenopathy.   Electronically Signed   By: Lavonia Dana M.D.   On: 11/28/2014 21:55    EKG: Independently reviewed.  Abnormal findings:  QTC 465, PVC frequently, no old EKG to compare with  Assessment/Plan Principal Problem:   Acute CHF Active Problems:   DVT (deep venous thrombosis)   TIA (transient ischemic attack)   Diabetes mellitus without complication   Acute CHF: Patient's shortness of  breath is most likely caused by acute congestive heart failure. No pneumonia on chest x-ray. Less likely to have a pulmonary embolism given therapeutic INR on Coumadin. -will admit to Telemetry bed. -will treat with IV lasix 40 mg today and followed by 20 mg qdaily  -ASA -will cycle CE X3 -will check TSH, A1c, FLP -will get 2-D echo to evaluate EF -strict In/Out -heart diet -No on ACEI because of normal bp - may need to add BB after acute CHF subsids  DVT (deep venous thrombosis): on coumadin with INR 2.79 -continue coumadin  Hx of TIA (transient ischemic attack): -continue home ASA  Diabetes mellitus: No A1c record. Patient is not on medications at home. -Check CBG daily, start sliding scale insulin if CBG is a significantly elevated -follow-up A1c.   DVT ppx: On coumadin    Code Status: Full code Family Communication:  Yes, patient's   Wife and son    at bed side Disposition Plan: Admit to inpatient   Date of Service 11/29/2014    Michael Randolph [facility] Pager [phone]  If 7PM-7AM, please contact night-coverage [email or web address] Password [facility] 11/29/2014, 7:41 AM

## 2014-11-29 ENCOUNTER — Encounter (HOSPITAL_COMMUNITY): Payer: Self-pay | Admitting: Internal Medicine

## 2014-11-29 ENCOUNTER — Inpatient Hospital Stay (HOSPITAL_COMMUNITY): Payer: Medicare Other

## 2014-11-29 ENCOUNTER — Other Ambulatory Visit (HOSPITAL_COMMUNITY): Payer: Medicare Other

## 2014-11-29 DIAGNOSIS — R0602 Shortness of breath: Secondary | ICD-10-CM

## 2014-11-29 DIAGNOSIS — G459 Transient cerebral ischemic attack, unspecified: Secondary | ICD-10-CM

## 2014-11-29 DIAGNOSIS — R079 Chest pain, unspecified: Secondary | ICD-10-CM

## 2014-11-29 DIAGNOSIS — R0789 Other chest pain: Secondary | ICD-10-CM

## 2014-11-29 DIAGNOSIS — I509 Heart failure, unspecified: Secondary | ICD-10-CM

## 2014-11-29 LAB — GLUCOSE, CAPILLARY
GLUCOSE-CAPILLARY: 127 mg/dL — AB (ref 65–99)
Glucose-Capillary: 126 mg/dL — ABNORMAL HIGH (ref 65–99)

## 2014-11-29 LAB — BASIC METABOLIC PANEL
Anion gap: 8 (ref 5–15)
BUN: 18 mg/dL (ref 6–20)
CHLORIDE: 101 mmol/L (ref 101–111)
CO2: 28 mmol/L (ref 22–32)
CREATININE: 1.21 mg/dL (ref 0.61–1.24)
Calcium: 9.6 mg/dL (ref 8.9–10.3)
GFR calc Af Amer: >60 mL/min (ref >60)
GFR calc non Af Amer: 56 mL/min — ABNORMAL LOW (ref >60)
Glucose, Bld: 126 mg/dL — ABNORMAL HIGH (ref 65–99)
Potassium: 4.2 mmol/L (ref 3.5–5.1)
Sodium: 137 mmol/L (ref 135–145)

## 2014-11-29 LAB — LIPID PANEL
CHOLESTEROL: 201 mg/dL — AB (ref 0–200)
HDL: 30 mg/dL — ABNORMAL LOW (ref >40)
LDL CALC: 147 mg/dL — AB (ref 0–99)
Total CHOL/HDL Ratio: 6.7 RATIO
Triglycerides: 120 mg/dL (ref <150)
VLDL: 24 mg/dL (ref 0–40)

## 2014-11-29 LAB — PROTIME-INR
INR: 2.37 — ABNORMAL HIGH (ref 0.00–1.49)
Prothrombin Time: 26.1 seconds — ABNORMAL HIGH (ref 11.6–15.2)

## 2014-11-29 LAB — TROPONIN I
TROPONIN I: 0.06 ng/mL — AB (ref <0.031)
Troponin I: 0.06 ng/mL — ABNORMAL HIGH (ref <0.031)
Troponin I: 0.04 ng/mL — ABNORMAL HIGH (ref <0.031)

## 2014-11-29 LAB — MAGNESIUM: MAGNESIUM: 2.2 mg/dL (ref 1.7–2.4)

## 2014-11-29 LAB — TSH: TSH: 4.347 u[IU]/mL (ref 0.350–4.500)

## 2014-11-29 MED ORDER — SODIUM CHLORIDE 0.9 % IJ SOLN
3.0000 mL | Freq: Two times a day (BID) | INTRAMUSCULAR | Status: DC
  Administered 2014-11-29 – 2014-12-01 (×5): 3 mL via INTRAVENOUS

## 2014-11-29 MED ORDER — WARFARIN - PHARMACIST DOSING INPATIENT: Freq: Every day | Status: DC

## 2014-11-29 MED ORDER — HEPARIN SODIUM (PORCINE) 5000 UNIT/ML IJ SOLN
5000.0000 [IU] | Freq: Three times a day (TID) | INTRAMUSCULAR | Status: DC
Start: 2014-11-29 — End: 2014-11-29

## 2014-11-29 MED ORDER — WARFARIN SODIUM 2.5 MG PO TABS
2.5000 mg | ORAL_TABLET | ORAL | Status: DC
  Filled 2014-11-29: qty 1

## 2014-11-29 MED ORDER — FUROSEMIDE 10 MG/ML IJ SOLN
20.0000 mg | Freq: Every day | INTRAMUSCULAR | Status: DC
  Administered 2014-11-29 – 2014-12-01 (×3): 20 mg via INTRAVENOUS
  Filled 2014-11-29 (×4): qty 2

## 2014-11-29 MED ORDER — ACETAMINOPHEN 325 MG PO TABS: 650.0000 mg | ORAL_TABLET | ORAL | Status: DC | PRN

## 2014-11-29 MED ORDER — ONDANSETRON HCL 4 MG/2ML IJ SOLN: 4.0000 mg | Freq: Four times a day (QID) | INTRAMUSCULAR | Status: DC | PRN

## 2014-11-29 MED ORDER — VITAMIN D3 25 MCG (1000 UNIT) PO TABS
2000.0000 [IU] | ORAL_TABLET | Freq: Every day | ORAL | Status: DC
Start: 2014-11-29 — End: 2014-12-02
  Administered 2014-11-29 – 2014-12-02 (×4): 2000 [IU] via ORAL
  Filled 2014-11-29 (×4): qty 2

## 2014-11-29 MED ORDER — SODIUM CHLORIDE 0.9 % IV SOLN: 250.0000 mL | INTRAVENOUS | Status: DC | PRN

## 2014-11-29 MED ORDER — WARFARIN SODIUM 5 MG PO TABS
5.0000 mg | ORAL_TABLET | ORAL | Status: DC
  Administered 2014-11-29: 5 mg via ORAL
  Filled 2014-11-29 (×2): qty 1

## 2014-11-29 MED ORDER — ASPIRIN EC 81 MG PO TBEC
81.0000 mg | DELAYED_RELEASE_TABLET | Freq: Every day | ORAL | Status: DC
  Administered 2014-11-29 – 2014-11-30 (×2): 81 mg via ORAL
  Filled 2014-11-29 (×2): qty 1

## 2014-11-29 MED ORDER — ASPIRIN 81 MG PO TABS: 81.0000 mg | ORAL_TABLET | Freq: Every day | ORAL | Status: DC

## 2014-11-29 MED ORDER — SODIUM CHLORIDE 0.9 % IJ SOLN: 3.0000 mL | INTRAMUSCULAR | Status: DC | PRN

## 2014-11-29 MED ORDER — GARLIC 3 MG PO CAPS: 1.0000 | ORAL_CAPSULE | Freq: Every day | ORAL | Status: DC

## 2014-11-29 NOTE — Progress Notes (Signed)
Pt had 11 beat run of VT.  Pt asymptomatic, VS wnl.  MD made aware.

## 2014-11-29 NOTE — Progress Notes (Signed)
*   Echocardiogram 2D Echocardiogram has been performed.  Lysle Rubens 11/29/2014, 1:51 PM

## 2014-11-29 NOTE — Progress Notes (Signed)
[facility]                                                                              Patient Demographics  Michael Randolph, is a 80 y.o. male, DOB - Jun 19, 1935, RCV:[id number]  Admit date - 11/28/2014   Admitting Physician Ivor Costa, MD  Outpatient Primary MD for the patient is [name], MD  LOS - 1   Chief Complaint  Patient presents with  . Shortness of Breath  . Chest Pain      HPI on 11/28/2014 by Dr. Otho Darner is a 80 y.o. male with PMH of diet controlled DM, Hx of DVT on Coumadin, hx of TIA, who presents with sob and cough. Patient report that he has been having SOB for about 9 days. He has left sided chest tightness. He has orthopnea at night. He has cough productive of yellow sputum. He has chronic leg edema due to DVT. Currently patient denies fever, chills, running nose, ear pain, headaches, abdominal pain, diarrhea, constipation, dysuria, urgency, frequency, hematuria, skin rashes. No unilateral weakness, numbness or tingling sensations. No vision change or hearing loss.  In ED, patient was found to have BMP 732.6, negative troponin, and INR 2.79, temperature normal, tachycardia, renal function okay. Chest x-ray is suggestive for congestive heart failure. Assessment & Plan   Acute dyspnea -Possibly secondary to new CHF  -Echocardiogram pending -CXR: Enlargement of cardiac sihouette with vascular congestion, scattered interstitial infiltrates, ?CHF -Continue diuresis -Monitor daily weights, intake/output -Cardiology consulted and appreicated -Continue aspirin   Chest tightness/ ?Vtach -Worse with exertion and lying down -CP has been ongoing since May 7th -Monitor showed 11 beat run of Vtach -Will check magnesium and phos, TSH -Troponin 0.06 -Cardiology consulted and appreciated  DVT -Continue coumadin, INR 2.37  History of TIA -Continue ASA, coumadin  Diabetes mellitus, type 2 -HbA1c pending -Continue ISS and CBG  monitoring -Patient is not on medications at home, likely diet controlled?  Code Status: Full  Family Communication: Wife at bedside  Disposition Plan: Admitted, pending echocardiogram and cardiology consult  Time Spent in minutes   30 minutes  Procedures  None  Consults   Cardiology  DVT Prophylaxis  Couamdin  Lab Results  Component Value Date   PLT 202 11/28/2014    Medications  Scheduled Meds: . aspirin EC  81 mg Oral Daily  . cholecalciferol  2,000 Units Oral Daily  . furosemide  20 mg Intravenous Daily  . sodium chloride  3 mL Intravenous Q12H  . warfarin  5 mg Oral Once per day on Sun Tue Wed Thu Sat   And  . warfarin  2.5 mg Oral Once per day on Mon Fri  . Warfarin - Pharmacist Dosing Inpatient   Does not apply q1800   Continuous Infusions:  PRN Meds:.sodium chloride, acetaminophen, ondansetron (ZOFRAN) IV, sodium chloride  Antibiotics    Anti-infectives    None      Subjective:   Michael Randolph seen and examined today.  Patient states he has had chest pain since May 7 intermittently.  He states it worse with laying down, and radiates to his back.  He denies dizziness, headache, abdominal pain.  He has had shortness of breath since November 2015 since having the flu.   Objective:   Filed Vitals:   11/29/14 0005 11/29/14 0027 11/29/14 0639 11/29/14 0816  BP: 111/75  110/79 102/73  Pulse: 84  82 73  Temp: 98.4 F (36.9 C)  98 F (36.7 C)   TempSrc: Oral  Oral   Resp: 19  18   Height: 5\' 7"  (1.702 m)     Weight:  96.48 kg (212 lb 11.2 oz)    SpO2: 97%  97%     Wt Readings from Last 3 Encounters:  11/29/14 96.48 kg (212 lb 11.2 oz)     Intake/Output Summary (Last 24 hours) at 11/29/14 0825 Last data filed at 11/29/14 0208  Gross per 24 hour  Intake    243 ml  Output    675 ml  Net   -432 ml    Exam  General: Well developed, well nourished, NAD, appears stated age  HEENT: NCAT, mucous membranes moist.   Cardiovascular: S1 S2  auscultated, no rubs, murmurs or gallops. Regular rate and rhythm.  Respiratory: Expiratory wheezing noted in all lung fields  Abdomen: Soft, nontender, nondistended, + bowel sounds  Extremities: warm dry without cyanosis clubbing. +1edema in LE B/L  Neuro: AAOx3, nonfocal   Psych: Normal affect and demeanor with intact judgement and insight  Data Review   Micro Results No results found for this or any previous visit (from the past 240 hour(s)).  Radiology Reports Dg Chest 2 View  11/28/2014   CLINICAL DATA:  LEFT side chest pain and shortness of breath for 10 days, history CHF, hypertension, diabetes  EXAM: CHEST  2 VIEW  COMPARISON:  None  FINDINGS: Slight rotation to the RIGHT on PA view.  Enlargement of cardiac silhouette.  Slight pulmonary vascular congestion.  Mediastinal contours normal.  Diffuse interstitial infiltrates with suspect small bibasilar effusions question pulmonary edema/CHF.  Prominence of RIGHT hilum identified.  No pneumothorax.  Bones demineralized with scattered endplate spur formation thoracic spine.  IMPRESSION: Enlargement of cardiac silhouette with slight vascular congestion and scattered interstitial infiltrates stools due with type bibasilar effusions, favoring CHF.  RIGHT hilum is prominent, though this may be in part related to slight rotation to the RIGHT ; recommend followup radiographs in 2-4 weeks to reassess and exclude hilar mass/adenopathy.   Electronically Signed   By: Lavonia Dana M.D.   On: 11/28/2014 21:55    CBC  Recent Labs Lab 11/28/14 1851  WBC 7.5  HGB 14.4  HCT 43.5  PLT 202  MCV 80.0  MCH 26.5  MCHC 33.1  RDW 13.6    Chemistries   Recent Labs Lab 11/28/14 1851 11/29/14 0557  NA 138 137  K 4.3 4.2  CL 103 101  CO2 23 28  GLUCOSE 121* 126*  BUN 20 18  CREATININE 1.11 1.21  CALCIUM 9.3 9.6  MG  --  2.2    ------------------------------------------------------------------------------------------------------------------ estimated creatinine clearance is 55.7 mL/min (by C-G formula based on Cr of 1.21). ------------------------------------------------------------------------------------------------------------------ No results for input(s): HGBA1C in the last 72 hours. ------------------------------------------------------------------------------------------------------------------  Recent Labs  11/29/14 0354  CHOL 201*  HDL 30*  LDLCALC 147*  TRIG 120  CHOLHDL 6.7   ------------------------------------------------------------------------------------------------------------------  Recent Labs  11/29/14 0354  TSH 4.347   ------------------------------------------------------------------------------------------------------------------ No results for input(s): VITAMINB12, FOLATE, FERRITIN, TIBC, IRON, RETICCTPCT in the last 72 hours.  Coagulation profile  Recent Labs Lab 11/28/14 1851 11/29/14 0557  INR 2.79* 2.37*  No results for input(s): DDIMER in the last 72 hours.  Cardiac Enzymes  Recent Labs Lab 11/29/14 0029 11/29/14 0557  TROPONINI 0.06* 0.06*   ------------------------------------------------------------------------------------------------------------------ Invalid input(s): POCBNP    Mackie Holness D.O. on 11/29/2014 at 8:25 AM  Between 7am to 7pm - Pager - [phone]  After 7pm go to [email or web address] - password [email or web address]  And look for the night coverage person covering for me after hours  [facility] Office  [phone]

## 2014-11-29 NOTE — Consult Note (Signed)
CONSULT NOTE  Date: 11/29/2014               Patient Name:  Michael Randolph MRN: [id number]  DOB: Oct 11, 1934 Age / Sex: 80 y.o., male        PCP: [name] Primary Cardiologist: New / Autym Siess            Referring Physician: Ree Kida              Reason for Consult:  shortness of breath            History of Present Illness: Patient is a 80 y.o. male with a PMHx of  DM, DVT, TIA , who was admitted to [facility] on 11/28/2014 for evaluation of dyspnea and cough.  Had the flu around thanksgiving. Never completely got over that. Did some heavy yard work - installed a drain pipe,  Rakes leaves several weeks ago.  Has been shortness of breath.  Several weeks ago he walked about a mile but had to stop every 30 steps or so .  Did have some CP / tightness with that walking .    His mild CP when he lies down. 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    Medications: Outpatient medications: Prescriptions prior to admission  Medication Sig Dispense Refill Last Dose  . aspirin 81 MG tablet Take 81 mg by mouth daily.   11/28/2014 at Unknown time  . Cholecalciferol (VITAMIN D-3) 1000 UNITS CAPS Take 2 capsules by mouth daily.   11/27/2014 at Unknown time  . GARLIC PO Take 1 tablet by mouth daily.   11/27/2014 at Unknown time  . warfarin (COUMADIN) 5 MG tablet Take 2.5-5 mg by mouth daily. Take a whole tablet every day except on mondays and fridays take 2.5mg    11/27/2014 at Unknown time    Current medications: Current Facility-Administered Medications  Medication Dose Route Frequency Provider Last Rate Last Dose  . 0.9 %  sodium chloride infusion  250 mL Intravenous PRN Ivor Costa, MD      . acetaminophen (TYLENOL) tablet 650 mg  650 mg Oral Q4H PRN Ivor Costa, MD      . aspirin EC tablet 81 mg  81 mg Oral Daily Ivor Costa, MD   81 mg at 11/29/14 0948  . cholecalciferol (VITAMIN D) tablet 2,000 Units  2,000  Units Oral Daily Ivor Costa, MD   2,000 Units at 11/29/14 0948  . furosemide (LASIX) injection 20 mg  20 mg Intravenous Daily Ivor Costa, MD   20 mg at 11/29/14 0138  . ondansetron (ZOFRAN) injection 4 mg  4 mg Intravenous Q6H PRN Ivor Costa, MD      . sodium chloride 0.9 % injection 3 mL  3 mL Intravenous Q12H Ivor Costa, MD   3 mL at 11/29/14 0948  . sodium chloride 0.9 % injection 3 mL  3 mL Intravenous PRN Ivor Costa, MD      . warfarin (COUMADIN) tablet 5 mg  5 mg Oral Once per day on Sun Tue Wed Thu Sat Wendee Beavers, RPH   5 mg at 11/29/14 2025   And  . warfarin (COUMADIN) tablet 2.5 mg  2.5 mg Oral Once per day on Mon Fri Wendee Beavers, [facility] [address]   Stopped at 11/29/14 0034  . Warfarin - Pharmacist Dosing Inpatient   Does not apply [place]  Blaine Hamper, MD         Allergies  Allergen Reactions  . Actos [Pioglitazone] Other (See Comments)    fatigue  . Sulfa Antibiotics Other (See Comments)    hallucinations  . Dye Fdc Red [Red Dye] Rash    # 40     Past Medical History  Diagnosis Date  . DVT (deep venous thrombosis)   . Diabetes mellitus without complication     no medications  . TIA (transient ischemic attack) 2008    was seen on CT     Past Surgical History  Procedure Laterality Date  . Rotator cuff repair Right     Family History  Problem Relation Age of Onset  . Stroke Mother   . Hypertension Mother   . Heart attack Father   . Crohn's disease Brother   . Schizophrenia Sister     Social History:  reports that he has never smoked. He does not have any smokeless tobacco history on file. He reports that he does not drink alcohol or use illicit drugs.   Review of Systems: Constitutional:  denies fever, chills, diaphoresis, appetite change and fatigue.  HEENT: denies photophobia, eye pain, redness, hearing loss, ear pain, congestion, sore throat, rhinorrhea, sneezing, neck pain, neck stiffness and tinnitus.  Respiratory: admits to SOB,  Especially with exertion.    Cardiovascular: denies chest pain, palpitations and leg swelling.  Gastrointestinal: denies nausea, vomiting, abdominal pain, diarrhea, constipation, blood in stool.  Genitourinary: denies dysuria, urgency, frequency, hematuria, flank pain and difficulty urinating.  Musculoskeletal: denies  myalgias, back pain, joint swelling, arthralgias and gait problem.   Skin: denies pallor, rash and wound.  Neurological: denies dizziness, seizures, syncope, weakness, light-headedness, numbness and headaches.   Hematological: denies adenopathy, easy bruising, personal or family bleeding history.  Psychiatric/ Behavioral: denies suicidal ideation, mood changes, confusion, nervousness, sleep disturbance and agitation.    Physical Exam: BP 102/73 mmHg  Pulse 73  Temp(Src) 98 F (36.7 C) (Oral)  Resp 18  Ht 5\' 7"  (1.702 m)  Wt 96.48 kg (212 lb 11.2 oz)  BMI 33.31 kg/m2  SpO2 97%  Wt Readings from Last 3 Encounters:  11/29/14 96.48 kg (212 lb 11.2 oz)    General: Vital signs reviewed and noted. Well-developed, well-nourished, in no acute distress; alert,   Head: Normocephalic, atraumatic, sclera anicteric,   Neck: Supple. Negative for carotid bruits. No JVD   Lungs:  Clear bilaterally, no  wheezes, rales, or rhonchi. Breathing is normal   Heart: RRR with S1 S2. No murmurs, rubs, or gallops   Abdomen/ GI :  Soft, non-tender, non-distended with normoactive bowel sounds. No hepatomegaly. No rebound/guarding. No obvious abdominal masses   MSK: Strength and the appear normal for age.   Extremities: No clubbing or cyanosis. No edema.  Distal pedal pulses are 2+ and equal   Neurologic:  CN are grossly intact,  No obvious motor or sensory defect.  Alert and oriented X 3. Moves all extremities spontaneously.  Psych: Responds to questions appropriately with a normal affect.     Lab results: Basic Metabolic Panel:  Recent Labs Lab 11/28/14 1851 11/29/14 0557  NA 138 137  K 4.3 4.2  CL 103 101   CO2 23 28  GLUCOSE 121* 126*  BUN 20 18  CREATININE 1.11 1.21  CALCIUM 9.3 9.6  MG  --  2.2    Liver Function Tests: No results for input(s): AST, ALT, ALKPHOS, BILITOT, PROT, ALBUMIN in the last 168 hours. No results  for input(s): LIPASE, AMYLASE in the last 168 hours. No results for input(s): AMMONIA in the last 168 hours.  CBC:  Recent Labs Lab 11/28/14 1851  WBC 7.5  HGB 14.4  HCT 43.5  MCV 80.0  PLT 202    Cardiac Enzymes:  Recent Labs Lab 11/29/14 0029 11/29/14 0557  TROPONINI 0.06* 0.06*    BNP: Invalid input(s): POCBNP  CBG:  Recent Labs Lab 11/29/14 0045 11/29/14 0639  GLUCAP 127* 126*    Coagulation Studies:  Recent Labs  11/28/14 1851 11/29/14 0557  LABPROT 29.6* 26.1*  INR 2.79* 2.37*     Other results: Personal review of EKG shows :  NSR TWI in the ant. Lateral leads.    Imaging: Dg Chest 2 View  11/28/2014   CLINICAL DATA:  LEFT side chest pain and shortness of breath for 10 days, history CHF, hypertension, diabetes  EXAM: CHEST  2 VIEW  COMPARISON:  None  FINDINGS: Slight rotation to the RIGHT on PA view.  Enlargement of cardiac silhouette.  Slight pulmonary vascular congestion.  Mediastinal contours normal.  Diffuse interstitial infiltrates with suspect small bibasilar effusions question pulmonary edema/CHF.  Prominence of RIGHT hilum identified.  No pneumothorax.  Bones demineralized with scattered endplate spur formation thoracic spine.  IMPRESSION: Enlargement of cardiac silhouette with slight vascular congestion and scattered interstitial infiltrates stools due with type bibasilar effusions, favoring CHF.  RIGHT hilum is prominent, though this may be in part related to slight rotation to the RIGHT ; recommend followup radiographs in 2-4 weeks to reassess and exclude hilar mass/adenopathy.   Electronically Signed   By: Lavonia Dana M.D.   On: 11/28/2014 21:55        Assessment & Plan:  1. Acute Dyspnea: Patient has symptoms  that are worrisome for acute on chronic congestive heart failure. He also has symptoms that are worrisome for angina. He will be getting an echocardiogram today. If his echocardiogram shows that he has reduced LV function, I would prefer referring him to cardiac cath  Instead of a Myoview study. If the LV function is normal, we'll schedule him for a Lexiscan Myoview study tomorrow.  2. DVT:  His INR is 2.37. I would discontinue his Coumadin now and start on heparin. There is a fairly high chance that we'll need to consider cardiac catheterization during this admission.  3.  DM:  Plans per internal medicine team.  4.  Chest tightness:  He's had some episodes of chest tightness with exertion.  Consider cardiac cath - especially if the myoview is abnormal. Troponin is minimally elevated .  Could be from ACS but also could be from CHF  continue to monitor troponin levels   Thayer Headings, Brooke Bonito., MD, [facility] 11/29/2014, 10:38 AM Office - [phone] Pager [phone]

## 2014-11-29 NOTE — Evaluation (Signed)
Physical Therapy Evaluation Patient Details Name: ROSS BENDER MRN: [id number] DOB: 1935-04-14 Today's Date: 11/29/2014   History of Present Illness  BARI HANDSHOE is a 80 y.o. male with PMH of diet controlled DM, Hx of DVT on Coumadin, hx of TIA, who presents with sob and cough. CHF exacerbation.  Clinical Impression  Patient evaluated by Physical Therapy with no further acute PT needs identified. All education has been completed and the patient has no further questions. Pt ambulated and ascended stairs independently, though exertion increased mid back pain and increased WOB. No acute needs at this time, pt encouraged to ambulate in hall at least 5x/ day.  See below for any follow-up Physical Therapy or equipment needs. PT is signing off. Thank you for this referral.     Follow Up Recommendations No PT follow up    Equipment Recommendations  None recommended by PT    Recommendations for Other Services       Precautions / Restrictions Precautions Precautions: None Restrictions Weight Bearing Restrictions: No      Mobility  Bed Mobility Overal bed mobility: Independent             General bed mobility comments: pt has been getting up and down independently, has been mostly sitting on teh side of the bed because even with HOB raised to maximum height, pt feels that he cannot breathe when leaned back against it  Transfers Overall transfer level: Independent Equipment used: None                Ambulation/Gait Ambulation/Gait assistance: Independent Ambulation Distance (Feet): 300 Feet Assistive device: None Gait Pattern/deviations: WFL(Within Functional Limits) Gait velocity: WFL Gait velocity interpretation: at or above normal speed for age/gender General Gait Details: pt ambulated without AD and without gait or balance abnormalities. VSS throughout on flat, even surface  Stairs Stairs: Yes Stairs assistance: Modified independent (Device/Increase  time) Stair Management: One rail Right;Alternating pattern;Forwards Number of Stairs: 10 General stair comments: pt became winded on stairs, needed 2 min rest after one flight and reported increase in pain in center of back. HR 125 bpm, O2 sats 96% on RA.  Wheelchair Mobility    Modified Rankin (Stroke Patients Only)       Balance Overall balance assessment: No apparent balance deficits (not formally assessed)                                           Pertinent Vitals/Pain Pain Assessment: Faces Faces Pain Scale: Hurts little more Pain Location: mid back Pain Intervention(s): Monitored during session    [place] expects to be discharged to:: Private residence Living Arrangements: Spouse/significant other Available Help at Discharge: Family;Available PRN/intermittently Type of Home: House Home Access: Stairs to enter Entrance Stairs-Rails: Right Entrance Stairs-Number of Steps: 4 Home Layout: One level Home Equipment: None      Prior Function Level of Independence: Independent               Hand Dominance        Extremity/Trunk Assessment   Upper Extremity Assessment: Overall WFL for tasks assessed           Lower Extremity Assessment: Overall WFL for tasks assessed      Cervical / Trunk Assessment: Kyphotic  Communication   Communication: No difficulties  Cognition Arousal/Alertness: Awake/alert Behavior During Therapy: WFL for tasks assessed/performed Overall Cognitive  Status: Within Functional Limits for tasks assessed                      General Comments General comments (skin integrity, edema, etc.): encouraged pt to ambulate in hallway at least 5x/ day. Also discussed posture with mobility    Exercises        Assessment/Plan    PT Assessment Patent does not need any further PT services  PT Diagnosis Difficulty walking;Other (comment) (due to cardiopulmonary status)   PT Problem List     PT Treatment Interventions     PT Goals (Current goals can be found in the Care Plan section) Acute Rehab PT Goals Patient Stated Goal: return home, breathe better PT Goal Formulation: All assessment and education complete, DC therapy    Frequency     Barriers to discharge        Co-evaluation               End of Session   Activity Tolerance: Patient tolerated treatment well Patient left: with family/visitor present;with call bell/phone within reach (standing in room) Nurse Communication: Mobility status         Time: 0950-1001 PT Time Calculation (min) (ACUTE ONLY): 11 min   Charges:   PT Evaluation $Initial PT Evaluation Tier I: 1 Procedure     PT G Codes:       Leighton Roach, PT  Acute Rehab Services  [place], [place] 11/29/2014, 11:18 AM

## 2014-11-29 NOTE — Progress Notes (Signed)
Notified night on-call hospitalist of patient having frequent PVCs and on Heart monitors kept reading V-tach but they never had V-tach just frequent PVC's. Orders given to see result of Magnesium in the morning and if elevated to notify doctor. Magnesium was wnl. Reported off to day nurse and nurse signing off at this time.

## 2014-11-29 NOTE — Progress Notes (Signed)
ANTICOAGULATION CONSULT NOTE - Initial Consult  Pharmacy Consult for Coumadin Indication: DVT  Allergies  Allergen Reactions  . Actos [Pioglitazone] Other (See Comments)    fatigue  . Sulfa Antibiotics Other (See Comments)    hallucinations  . Dye Fdc Red [Red Dye] Rash    # 40    Patient Measurements: Height: 5' 7.5" (171.5 cm) Weight: 216 lb 12.8 oz (98.34 kg) IBW/kg (Calculated) : 67.25  Vital Signs: Temp: 97.9 F (36.6 C) (05/17 1841) Temp Source: Oral (05/17 1841) BP: 109/94 mmHg (05/17 2315) Pulse Rate: 86 (05/17 2315)  Labs:  Recent Labs  11/28/14 1851  HGB 14.4  HCT 43.5  PLT 202  LABPROT 29.6*  INR 2.79*  CREATININE 1.11    Estimated Creatinine Clearance: 61.8 mL/min (by C-G formula based on Cr of 1.11).   Medical History: Past Medical History  Diagnosis Date  . DVT (deep venous thrombosis)   . Diabetes mellitus without complication     no medications  . TIA (transient ischemic attack) 2008    was seen on CT     Medications:  Prescriptions prior to admission  Medication Sig Dispense Refill Last Dose  . aspirin 81 MG tablet Take 81 mg by mouth daily.   11/28/2014 at Unknown time  . Cholecalciferol (VITAMIN D-3) 1000 UNITS CAPS Take 2 capsules by mouth daily.   11/27/2014 at Unknown time  . GARLIC PO Take 1 tablet by mouth daily.   11/27/2014 at Unknown time  . warfarin (COUMADIN) 5 MG tablet Take 2.5-5 mg by mouth daily. Take a whole tablet every day except on mondays and fridays take 2.5mg    11/27/2014 at Unknown time    Assessment: 80 y.o. male with SOB, h/o DVT, to continue Coumadin Goal of Therapy:  INR 2-3 Monitor platelets by anticoagulation protocol: Yes   Plan:  Continue home Coumadin regimen Daily INR  Caryl Pina 11/29/2014,12:05 AM

## 2014-11-29 NOTE — Progress Notes (Signed)
Utilization review completed. Wilder Amodei, RN, BSN. 

## 2014-11-29 NOTE — Progress Notes (Addendum)
ANTICOAGULATION CONSULT NOTE - Follow-up  Pharmacy Consult for Coumadin Indication: DVT  Allergies  Allergen Reactions  . Actos [Pioglitazone] Other (See Comments)    fatigue  . Sulfa Antibiotics Other (See Comments)    hallucinations  . Dye Fdc Red [Red Dye] Rash    # 40    Patient Measurements: Height: 5\' 7"  (170.2 cm) Weight: 212 lb 11.2 oz (96.48 kg) (a scale) IBW/kg (Calculated) : 66.1  Vital Signs: Temp: 98 F (36.7 C) (05/18 0639) Temp Source: Oral (05/18 0639) BP: 102/73 mmHg (05/18 0816) Pulse Rate: 73 (05/18 0816)  Labs:  Recent Labs  11/28/14 1851 11/29/14 0029 11/29/14 0557  HGB 14.4  --   --   HCT 43.5  --   --   PLT 202  --   --   LABPROT 29.6*  --  26.1*  INR 2.79*  --  2.37*  CREATININE 1.11  --  1.21  TROPONINI  --  0.06* 0.06*    Estimated Creatinine Clearance: 55.7 mL/min (by C-G formula based on Cr of 1.21).  Assessment: 41 yom presented to the hospital with SOB. He is on chronic coumadin for hx DVT. INR today remains therapeutic at 2.37. No new CBC today, no bleeding noted.   Goal of Therapy:  INR 2-3 Monitor platelets by anticoagulation protocol: Yes   Plan:  - Continue home coumadin 5mg  daily except 2.5mg  on Mon + Fri - Daily INR  Salome Arnt, PharmD, BCPS Pager # [phone] 11/29/2014 10:17 AM  Addendum: Transitioning to IV heparin. Since INR is therapeutic, will start heparin when INR is <2.   Salome Arnt, PharmD, BCPS Pager # [phone] 11/29/2014 11:09 AM

## 2014-11-30 DIAGNOSIS — R7989 Other specified abnormal findings of blood chemistry: Secondary | ICD-10-CM

## 2014-11-30 DIAGNOSIS — I5041 Acute combined systolic (congestive) and diastolic (congestive) heart failure: Secondary | ICD-10-CM

## 2014-11-30 DIAGNOSIS — I5021 Acute systolic (congestive) heart failure: Secondary | ICD-10-CM | POA: Insufficient documentation

## 2014-11-30 DIAGNOSIS — R778 Other specified abnormalities of plasma proteins: Secondary | ICD-10-CM | POA: Insufficient documentation

## 2014-11-30 DIAGNOSIS — I214 Non-ST elevation (NSTEMI) myocardial infarction: Secondary | ICD-10-CM | POA: Insufficient documentation

## 2014-11-30 LAB — CBC
HEMATOCRIT: 39.7 % (ref 39.0–52.0)
Hemoglobin: 12.9 g/dL — ABNORMAL LOW (ref 13.0–17.0)
MCH: 26 pg (ref 26.0–34.0)
MCHC: 32.5 g/dL (ref 30.0–36.0)
MCV: 79.9 fL (ref 78.0–100.0)
Platelets: 171 10*3/uL (ref 150–400)
RBC: 4.97 MIL/uL (ref 4.22–5.81)
RDW: 13.6 % (ref 11.5–15.5)
WBC: 7.7 10*3/uL (ref 4.0–10.5)

## 2014-11-30 LAB — BASIC METABOLIC PANEL
Anion gap: 10 (ref 5–15)
BUN: 18 mg/dL (ref 6–20)
CALCIUM: 9 mg/dL (ref 8.9–10.3)
CO2: 23 mmol/L (ref 22–32)
Chloride: 102 mmol/L (ref 101–111)
Creatinine, Ser: 1.01 mg/dL (ref 0.61–1.24)
GFR calc non Af Amer: >60 mL/min (ref >60)
Glucose, Bld: 112 mg/dL — ABNORMAL HIGH (ref 65–99)
Potassium: 3.9 mmol/L (ref 3.5–5.1)
Sodium: 135 mmol/L (ref 135–145)

## 2014-11-30 LAB — PROTIME-INR
INR: 2.76 — ABNORMAL HIGH (ref 0.00–1.49)
Prothrombin Time: 29.4 seconds — ABNORMAL HIGH (ref 11.6–15.2)

## 2014-11-30 LAB — HEMOGLOBIN A1C
Hgb A1c MFr Bld: 6.1 % — ABNORMAL HIGH (ref 4.8–5.6)
MEAN PLASMA GLUCOSE: 128 mg/dL

## 2014-11-30 LAB — GLUCOSE, CAPILLARY: Glucose-Capillary: 132 mg/dL — ABNORMAL HIGH (ref 65–99)

## 2014-11-30 MED ORDER — ASPIRIN 81 MG PO CHEW
81.0000 mg | CHEWABLE_TABLET | ORAL | Status: AC
  Administered 2014-12-01: 81 mg via ORAL
  Filled 2014-11-30 (×2): qty 1

## 2014-11-30 MED ORDER — ASPIRIN EC 81 MG PO TBEC
81.0000 mg | DELAYED_RELEASE_TABLET | Freq: Every day | ORAL | Status: DC
  Administered 2014-12-02: 81 mg via ORAL
  Filled 2014-11-30: qty 1

## 2014-11-30 MED ORDER — SODIUM CHLORIDE 0.9 % IJ SOLN
3.0000 mL | Freq: Two times a day (BID) | INTRAMUSCULAR | Status: DC
  Administered 2014-11-30 – 2014-12-01 (×3): 3 mL via INTRAVENOUS

## 2014-11-30 MED ORDER — SODIUM CHLORIDE 0.9 % IV SOLN
INTRAVENOUS | Status: DC
  Administered 2014-12-01: 10:00:00 via INTRAVENOUS

## 2014-11-30 MED ORDER — ATORVASTATIN CALCIUM 20 MG PO TABS
20.0000 mg | ORAL_TABLET | Freq: Every day | ORAL | Status: DC
  Administered 2014-11-30 – 2014-12-01 (×2): 20 mg via ORAL
  Filled 2014-11-30 (×4): qty 1

## 2014-11-30 MED ORDER — SODIUM CHLORIDE 0.9 % IV SOLN: 250.0000 mL | INTRAVENOUS | Status: DC | PRN

## 2014-11-30 MED ORDER — PHYTONADIONE 5 MG PO TABS
2.5000 mg | ORAL_TABLET | Freq: Once | ORAL | Status: AC
  Administered 2014-11-30: 2.5 mg via ORAL
  Filled 2014-11-30: qty 1

## 2014-11-30 MED ORDER — SODIUM CHLORIDE 0.9 % IJ SOLN
3.0000 mL | INTRAMUSCULAR | Status: DC | PRN
Start: 2014-11-30 — End: 2014-12-01

## 2014-11-30 NOTE — Progress Notes (Addendum)
[facility]                                                                              Patient Demographics  Michael Randolph, is a 80 y.o. male, DOB - 07/22/1934, PJK:[id number]  Admit date - 11/28/2014   Admitting Physician Ivor Costa, MD  Outpatient Primary MD for the patient is [name], MD  LOS - 2   Chief Complaint  Patient presents with  . Shortness of Breath  . Chest Pain      HPI on 11/28/2014 by Dr. Otho Darner is a 80 y.o. male with PMH of diet controlled DM, Hx of DVT on Coumadin, hx of TIA, who presents with sob and cough. Patient report that he has been having SOB for about 9 days. He has left sided chest tightness. He has orthopnea at night. He has cough productive of yellow sputum. He has chronic leg edema due to DVT. Currently patient denies fever, chills, running nose, ear pain, headaches, abdominal pain, diarrhea, constipation, dysuria, urgency, frequency, hematuria, skin rashes. No unilateral weakness, numbness or tingling sensations. No vision change or hearing loss.  In ED, patient was found to have BMP 732.6, negative troponin, and INR 2.79, temperature normal, tachycardia, renal function okay. Chest x-ray is suggestive for congestive heart failure.  Assessment & Plan   Acute dyspnea secondary to combined systolic and diastolic heart failure -Possibly secondary to new CHF  -Echocardiogram: EF of 35-40%, restrictive physiology indicative of decreased left ventricular diastolic compliance   -CXR: Enlargement of cardiac sihouette with vascular congestion, scattered interstitial infiltrates, ?CHF -Continue diuresis -Monitor daily weights, intake/output -UO over past 24 hours: 1962mL -Cardiology consulted and appreciated, pending further recommendations, ?stress/myoview -Continue aspirin   Chest tightness/ ?Vtach -Has been worse with exertion and lying down; patient denies CP overnight. -CP has been ongoing since May 7th -Magnesium  2.2 -TSH 4.347 -Lipid panel: TC 201, TG 120, HDL 30, LDL 147 -Troponin 0.06-->0.04 -Cardiology consulted and appreciated, pending further recommendations, ?stress/myoview  DVT -On coumadin as an outpatient, INR 2.76 -Will be transitioned to heparin once INR <2  History of TIA -Continue ASA  Diabetes mellitus, type 2 -HbA1c 6.1 -Continue ISS and CBG monitoring -Patient is not on medications at home, likely diet controlled?  Hyperlipidemia -Lipid panel: TC 201, TG 120, HDL 30, LDL 147 -Will start patient on atorvastatin  Code Status: Full  Family Communication: Wife at bedside  Disposition Plan: Admitted, pending further recommendations from cardiology  Time Spent in minutes   30 minutes  Procedures  Echocardiogram  Consults   Cardiology  DVT Prophylaxis  INR therapeutic, Coumadin held, Heparin to be started when INR <2  Lab Results  Component Value Date   PLT 171 11/30/2014    Medications  Scheduled Meds: . aspirin EC  81 mg Oral Daily  . cholecalciferol  2,000 Units Oral Daily  . furosemide  20 mg Intravenous Daily  . sodium chloride  3 mL Intravenous Q12H   Continuous Infusions:  PRN Meds:.sodium chloride, acetaminophen, ondansetron (ZOFRAN) IV, sodium chloride  Antibiotics    Anti-infectives    None      Subjective:   Michael Randolph seen and  examined today.  Patient denies chest pain or tightness overnight.  He states he has productive cough occasionally, which he attributes to the flu back in November.  He denies shortness of breath, abdominal pain, dizziness, headache.   Objective:   Filed Vitals:   11/29/14 0816 11/29/14 1429 11/29/14 2049 11/30/14 0641  BP: 102/73 120/78 134/87 113/82  Pulse: 73 86 94 79  Temp:  97.7 F (36.5 C) 98.3 F (36.8 C) 98.5 F (36.9 C)  TempSrc:  Oral Oral Oral  Resp:  18 18 17   Height:      Weight:    95.301 kg (210 lb 1.6 oz)  SpO2:  97% 97% 97%    Wt Readings from Last 3 Encounters:  11/30/14 95.301  kg (210 lb 1.6 oz)     Intake/Output Summary (Last 24 hours) at 11/30/14 1045 Last data filed at 11/30/14 0825  Gross per 24 hour  Intake   1140 ml  Output   1050 ml  Net     90 ml    Exam  General: Well developed, well nourished, no distress  HEENT: NCAT, mucous membranes moist.   Cardiovascular: S1 S2 auscultated, RRR, no murmurs  Respiratory: Diminished breath sounds, however, clear.  Abdomen: Soft, nontender, nondistended, + bowel sounds  Extremities: warm dry without cyanosis clubbing. +1edema in LE B/L  Neuro: AAOx3, nonfocal   Psych: Normal affect and demeanor  Data Review   Micro Results No results found for this or any previous visit (from the past 240 hour(s)).  Radiology Reports Dg Chest 2 View  11/28/2014   CLINICAL DATA:  LEFT side chest pain and shortness of breath for 10 days, history CHF, hypertension, diabetes  EXAM: CHEST  2 VIEW  COMPARISON:  None  FINDINGS: Slight rotation to the RIGHT on PA view.  Enlargement of cardiac silhouette.  Slight pulmonary vascular congestion.  Mediastinal contours normal.  Diffuse interstitial infiltrates with suspect small bibasilar effusions question pulmonary edema/CHF.  Prominence of RIGHT hilum identified.  No pneumothorax.  Bones demineralized with scattered endplate spur formation thoracic spine.  IMPRESSION: Enlargement of cardiac silhouette with slight vascular congestion and scattered interstitial infiltrates stools due with type bibasilar effusions, favoring CHF.  RIGHT hilum is prominent, though this may be in part related to slight rotation to the RIGHT ; recommend followup radiographs in 2-4 weeks to reassess and exclude hilar mass/adenopathy.   Electronically Signed   By: Lavonia Dana M.D.   On: 11/28/2014 21:55    CBC  Recent Labs Lab 11/28/14 1851 11/30/14 0420  WBC 7.5 7.7  HGB 14.4 12.9*  HCT 43.5 39.7  PLT 202 171  MCV 80.0 79.9  MCH 26.5 26.0  MCHC 33.1 32.5  RDW 13.6 13.6    Chemistries    Recent Labs Lab 11/28/14 1851 11/29/14 0557 11/30/14 0420  NA 138 137 135  K 4.3 4.2 3.9  CL 103 101 102  CO2 23 28 23   GLUCOSE 121* 126* 112*  BUN 20 18 18   CREATININE 1.11 1.21 1.01  CALCIUM 9.3 9.6 9.0  MG  --  2.2  --    ------------------------------------------------------------------------------------------------------------------ estimated creatinine clearance is 66.3 mL/min (by C-G formula based on Cr of 1.01). ------------------------------------------------------------------------------------------------------------------  Recent Labs  11/29/14 0354  HGBA1C 6.1*   ------------------------------------------------------------------------------------------------------------------  Recent Labs  11/29/14 0354  CHOL 201*  HDL 30*  LDLCALC 147*  TRIG 120  CHOLHDL 6.7   ------------------------------------------------------------------------------------------------------------------  Recent Labs  11/29/14 0354  TSH 4.347   ------------------------------------------------------------------------------------------------------------------  No results for input(s): VITAMINB12, FOLATE, FERRITIN, TIBC, IRON, RETICCTPCT in the last 72 hours.  Coagulation profile  Recent Labs Lab 11/28/14 1851 11/29/14 0557 11/30/14 0420  INR 2.79* 2.37* 2.76*    No results for input(s): DDIMER in the last 72 hours.  Cardiac Enzymes  Recent Labs Lab 11/29/14 0029 11/29/14 0557 11/29/14 1158  TROPONINI 0.06* 0.06* 0.04*   ------------------------------------------------------------------------------------------------------------------ Invalid input(s): POCBNP    Osiris Odriscoll D.O. on 11/30/2014 at 10:45 AM  Between 7am to 7pm - Pager - [phone]  After 7pm go to [email or web address] - password [email or web address]  And look for the night coverage person covering for me after hours  [facility] Office  [phone]

## 2014-11-30 NOTE — Progress Notes (Signed)
[place] for heparin Indication: DVT  Allergies  Allergen Reactions  . Actos [Pioglitazone] Other (See Comments)    fatigue  . Sulfa Antibiotics Other (See Comments)    hallucinations  . Dye Fdc Red [Red Dye] Rash    # 40    Patient Measurements: Height: 5\' 7"  (170.2 cm) Weight: 210 lb 1.6 oz (95.301 kg) IBW/kg (Calculated) : 66.1 Heparin Dosing Weight:   Vital Signs: Temp: 98.5 F (36.9 C) (05/19 0641) Temp Source: Oral (05/19 0641) BP: 113/82 mmHg (05/19 0641) Pulse Rate: 79 (05/19 0641)  Labs:  Recent Labs  11/28/14 1851 11/29/14 0029 11/29/14 0557 11/29/14 1158 11/30/14 0420  HGB 14.4  --   --   --  12.9*  HCT 43.5  --   --   --  39.7  PLT 202  --   --   --  171  LABPROT 29.6*  --  26.1*  --  29.4*  INR 2.79*  --  2.37*  --  2.76*  CREATININE 1.11  --  1.21  --  1.01  TROPONINI  --  0.06* 0.06* 0.04*  --     Estimated Creatinine Clearance: 66.3 mL/min (by C-G formula based on Cr of 1.01).   Medical History: Past Medical History  Diagnosis Date  . DVT (deep venous thrombosis)   . Diabetes mellitus without complication     no medications  . TIA (transient ischemic attack) 2008    was seen on CT     Medications:  See EMR  Assessment: 80 yo male admitted with SOB, on warfarin prior to admission for DVT. INR 2.76, last dose of warfarin was 5/18 at ~0130.   PTA dose: 2.5 mg on Mon/Fri, 5 mg all other days  Goal of Therapy:  Heparin level 0.3-0.7 units/ml Monitor platelets by anticoagulation protocol: Yes   Plan:  Hold on starting heparin until INR is < 2 F/u INR in am   Hughes Better, PharmD, BCPS Clinical Pharmacist Pager: [phone] 11/30/2014 9:28 AM

## 2014-11-30 NOTE — Progress Notes (Signed)
Noted in PT note that pt is totally independent and does not have OT or PT needs at this time.  Will sign off. Jinger Neighbors, [place] [phone]

## 2014-11-30 NOTE — Progress Notes (Signed)
Heart Failure Navigator Consult Note  Presentation: Michael Randolph is a 80 y.o. male with PMH of diet controlled DM, Hx of DVT on Coumadin, hx of TIA, who presents with sob and cough  Patient report that he has been having SOB for about 9 days. He has left sided chest tightness. He has orthopnea at night. He has cough productive of yellow sputum. He has chronic leg edema due to DVT. Currently patient denies fever, chills, running nose, ear pain, headaches, abdominal pain, diarrhea, constipation, dysuria, urgency, frequency, hematuria, skin rashes. No unilateral weakness, numbness or tingling sensations. No vision change or hearing loss.  In ED, patient was found to have BMP 732.6, negative troponin, and INR 2.79, temperature normal, tachycardia, renal function okay. Chest x-ray is suggestive for congestive heart failure.  Past Medical History  Diagnosis Date  . DVT (deep venous thrombosis)   . Diabetes mellitus without complication     no medications  . TIA (transient ischemic attack) 2008    was seen on CT     History   Social History  . Marital Status: Married    Spouse Name: N/A  . Number of Children: N/A  . Years of Education: N/A   Social History Main Topics  . Smoking status: Never Smoker   . Smokeless tobacco: Not on file  . Alcohol Use: No  . Drug Use: No  . Sexual Activity: Not on file   Other Topics Concern  . None   Social History Narrative    ECHO:Study Conclusions--11/29/14  - Left ventricle: The cavity size was normal. Wall thickness was increased in a pattern of mild LVH. Systolic function was moderately reduced. The estimated ejection fraction was in the range of 35% to 40%. Doppler parameters are consistent with restrictive physiology, indicative of decreased left ventricular diastolic compliance and/or increased left atrial pressure. - Aortic valve: There was trivial regurgitation. Valve area (VTI): 1.45 cm^2. Valve area (Vmax): 1.71  cm^2. Valve area (Vmean): 1.6 cm^2. - Mitral valve: Calcified annulus. There was mild regurgitation. - Left atrium: The atrium was mildly dilated.  Transthoracic echocardiography. M-mode, complete 2D, spectral Doppler, and color Doppler. Birthdate: Patient birthdate: Aug 08, 1934. Age: Patient is 80 yr old. Sex: Gender: male. BMI: 33.2 kg/m^2. Blood pressure:   102/73 Patient status: Inpatient. Study date: Study date: 11/29/2014. Study time: 12:49 PM. Location: Bedside.  BNP    Component Value Date/Time   BNP 732.6* 11/28/2014 1851    ProBNP No results found for: PROBNP   Education Assessment and Provision:  Detailed education and instructions provided on heart failure disease management including the following:  Signs and symptoms of Heart Failure When to call the physician Importance of daily weights Low sodium diet Fluid restriction Medication management Anticipated future follow-up appointments  Patient education given on each of the above topics.  Patient acknowledges understanding and acceptance of all instructions.  I spoke briefly with patient and his wife regarding his recent echo and decreased EF.   He has a scale and says he weighs "every other day".  We discussed the importance in daily weights and how they relate to signs and symptoms of HF.  We discussed high sodium foods and a low sodium diet.  He "loves country ham and bacon".  He tells me that he will have no issue with getting and taking medications as prescribed.  He may have a cardiac catheterization today and I have told he and his family that I will return tomorrow to reinforce his education.  Education Materials:  "Living Better With Heart Failure" Booklet, Daily Weight Tracker Tool .   High Risk Criteria for Readmission and/or Poor Patient Outcomes:   EF <30%- No 35-40%  2 or more admissions in 6 months- No  Difficult social situation- No --Lives with wife in  [place]  Demonstrates medication noncompliance- No   Barriers of Care:  Knowledge and compliance  Discharge Planning:   Plans to discharge to home with wife

## 2014-11-30 NOTE — Progress Notes (Signed)
Subjective: Breathing better.  No CP currently  Objective: Vital signs in last 24 hours: Temp:  [97.7 F (36.5 C)-98.5 F (36.9 C)] 98.5 F (36.9 C) (05/19 0641) Pulse Rate:  [79-94] 79 (05/19 0641) Resp:  [17-18] 17 (05/19 0641) BP: (113-134)/(78-87) 113/82 mmHg (05/19 0641) SpO2:  [97 %] 97 % (05/19 0641) Weight:  [210 lb 1.6 oz (95.301 kg)] 210 lb 1.6 oz (95.301 kg) (05/19 0641) Last BM Date: 11/29/14  Intake/Output from previous day: 05/18 0701 - 05/19 0700 In: 1140 [P.O.:1140] Out: 1900 [Urine:1900] Intake/Output this shift: Total I/O In: 600 [P.O.:600] Out: 50 [Urine:50]  Medications Scheduled Meds: . aspirin EC  81 mg Oral Daily  . atorvastatin  20 mg Oral q1800  . cholecalciferol  2,000 Units Oral Daily  . furosemide  20 mg Intravenous Daily  . sodium chloride  3 mL Intravenous Q12H   Continuous Infusions:  PRN Meds:.sodium chloride, acetaminophen, ondansetron (ZOFRAN) IV, sodium chloride  PE: General appearance: alert, cooperative and no distress Lungs: clear to auscultation bilaterally Heart: irregularly irregular rhythm and No MR Extremities: Trace LEE Pulses: 2+ and symmetric Skin: Warm and dry Neurologic: Grossly normal  Lab Results:   Recent Labs  11/28/14 1851 11/30/14 0420  WBC 7.5 7.7  HGB 14.4 12.9*  HCT 43.5 39.7  PLT 202 171   BMET  Recent Labs  11/28/14 1851 11/29/14 0557 11/30/14 0420  NA 138 137 135  K 4.3 4.2 3.9  CL 103 101 102  CO2 23 28 23   GLUCOSE 121* 126* 112*  BUN 20 18 18   CREATININE 1.11 1.21 1.01  CALCIUM 9.3 9.6 9.0   PT/INR  Recent Labs  11/28/14 1851 11/29/14 0557 11/30/14 0420  LABPROT 29.6* 26.1* 29.4*  INR 2.79* 2.37* 2.76*   Cholesterol  Recent Labs  11/29/14 0354  CHOL 201*   Lipid Panel     Component Value Date/Time   CHOL 201* 11/29/2014 0354   TRIG 120 11/29/2014 0354   HDL 30* 11/29/2014 0354   CHOLHDL 6.7 11/29/2014 0354   VLDL 24 11/29/2014 0354   LDLCALC 147*  11/29/2014 0354   Cardiac Panel (last 3 results)  Recent Labs  11/29/14 0029 11/29/14 0557 11/29/14 1158  TROPONINI 0.06* 0.06* 0.04*      Studies/Results: Echocardiogram Study Conclusions  - Left ventricle: The cavity size was normal. Wall thickness was increased in a pattern of mild LVH. Systolic function was moderately reduced. The estimated ejection fraction was in the range of 35% to 40%. Doppler parameters are consistent with restrictive physiology, indicative of decreased left ventricular diastolic compliance and/or increased left atrial pressure. - Aortic valve: There was trivial regurgitation. Valve area (VTI): 1.45 cm^2. Valve area (Vmax): 1.71 cm^2. Valve area (Vmean): 1.6 cm^2. - Mitral valve: Calcified annulus. There was mild regurgitation. - Left atrium: The atrium was mildly dilated.   Assessment/Plan Patient is a 80 y.o. male with a PMHx of DM, DVT, TIA , who was admitted to [facility] on 11/28/2014 for evaluation of dyspnea and cough.  Principal Problem:   Acute CHF Net fluids: -0.8L/-1.2L.  On IV lasix 20mg  daily.  EF 35-40% with no wall motion assessment due to quality.  Coronary angiography recommended.   INR went up to 2.76.  Will give Vit K so we can cath tomorrow.  Start IV heparin.  Precath orders completed.     DVT (deep venous thrombosis)  Coumadin DCd yesterday.     TIA (transient ischemic attack)   Diabetes mellitus without  complication   Chest tightness  Cath tomorrow.  Troponin mildly elevated with new cardiomyopathy.      LOS: 2 days    HAGER, BRYAN PA-C 11/30/2014 11:25 AM  The patient was seen, examined and discussed with Tarri Fuller, PA-C and I agree with the above.   80 year old male with admitted with acute CHF, new dg of moderate systolic dysfunction and mild troponin elevation, he is awaiting left cardiac cath scheduled for tomorrow.  He was started on low dose of lasix 20 mg po daily, continues to have mild crackles  at the bases, we will give extra 20 mg iv lasix now so he is able to lay flat tomorrow. He has recurrent DVTs and is on chronic anticoagulation, INR today 2.76, we will give vitamin K 2.5 mg po x 1 as per pharmacy recommendation.   Dorothy Spark 11/30/2014

## 2014-12-01 ENCOUNTER — Encounter (HOSPITAL_COMMUNITY)
Admission: EM | Disposition: A | Discharge status: Home / Self Care | Payer: Medicare Other | Source: Home / Self Care | Attending: Internal Medicine

## 2014-12-01 DIAGNOSIS — I82409 Acute embolism and thrombosis of unspecified deep veins of unspecified lower extremity: Secondary | ICD-10-CM

## 2014-12-01 DIAGNOSIS — I5021 Acute systolic (congestive) heart failure: Secondary | ICD-10-CM

## 2014-12-01 DIAGNOSIS — R7989 Other specified abnormal findings of blood chemistry: Secondary | ICD-10-CM

## 2014-12-01 DIAGNOSIS — I420 Dilated cardiomyopathy: Secondary | ICD-10-CM

## 2014-12-01 HISTORY — PX: CARDIAC CATHETERIZATION: SHX172

## 2014-12-01 LAB — PROTIME-INR
INR: 2.16 — ABNORMAL HIGH (ref 0.00–1.49)
INR: 1.83 — ABNORMAL HIGH (ref 0.00–1.49)
PROTHROMBIN TIME: 21.1 s — AB (ref 11.6–15.2)
Prothrombin Time: 23.9 seconds — ABNORMAL HIGH (ref 11.6–15.2)

## 2014-12-01 LAB — POCT I-STAT 3, ART BLOOD GAS (G3+)
Bicarbonate: 23.9 mEq/L (ref 20.0–24.0)
O2 Saturation: 94 %
PO2 ART: 68 mmHg — AB (ref 80.0–100.0)
TCO2: 25 mmol/L (ref 0–100)
pCO2 arterial: 35.1 mmHg (ref 35.0–45.0)
pH, Arterial: 7.441 (ref 7.350–7.450)

## 2014-12-01 LAB — GLUCOSE, CAPILLARY
Glucose-Capillary: 119 mg/dL — ABNORMAL HIGH (ref 65–99)
Glucose-Capillary: 125 mg/dL — ABNORMAL HIGH (ref 65–99)

## 2014-12-01 LAB — BASIC METABOLIC PANEL
ANION GAP: 9 (ref 5–15)
BUN: 18 mg/dL (ref 6–20)
CO2: 23 mmol/L (ref 22–32)
CREATININE: 1.08 mg/dL (ref 0.61–1.24)
Calcium: 9.1 mg/dL (ref 8.9–10.3)
Chloride: 105 mmol/L (ref 101–111)
GFR calc Af Amer: >60 mL/min (ref >60)
GFR calc non Af Amer: >60 mL/min (ref >60)
Glucose, Bld: 101 mg/dL — ABNORMAL HIGH (ref 65–99)
Potassium: 4 mmol/L (ref 3.5–5.1)
Sodium: 137 mmol/L (ref 135–145)

## 2014-12-01 LAB — POCT I-STAT 3, VENOUS BLOOD GAS (G3P V)
Bicarbonate: 24.5 mEq/L — ABNORMAL HIGH (ref 20.0–24.0)
O2 Saturation: 69 %
PCO2 VEN: 38.9 mmHg — AB (ref 45.0–50.0)
PH VEN: 7.408 — AB (ref 7.250–7.300)
TCO2: 26 mmol/L (ref 0–100)
pO2, Ven: 36 mmHg (ref 30.0–45.0)

## 2014-12-01 SURGERY — RIGHT/LEFT HEART CATH AND CORONARY ANGIOGRAPHY

## 2014-12-01 MED ORDER — HEPARIN SODIUM (PORCINE) 1000 UNIT/ML IJ SOLN
INTRAMUSCULAR | Status: DC | PRN
  Administered 2014-12-01: 5000 [IU] via INTRAVENOUS

## 2014-12-01 MED ORDER — MIDAZOLAM HCL 2 MG/2ML IJ SOLN
INTRAMUSCULAR | Status: AC
  Filled 2014-12-01: qty 2

## 2014-12-01 MED ORDER — SODIUM CHLORIDE 0.9 % IV SOLN: 250.0000 mL | INTRAVENOUS | Status: DC | PRN

## 2014-12-01 MED ORDER — FENTANYL CITRATE (PF) 100 MCG/2ML IJ SOLN
INTRAMUSCULAR | Status: AC
  Filled 2014-12-01: qty 2

## 2014-12-01 MED ORDER — SODIUM CHLORIDE 0.9 % IV SOLN: INTRAVENOUS | Status: AC

## 2014-12-01 MED ORDER — PHYTONADIONE 5 MG PO TABS
2.5000 mg | ORAL_TABLET | Freq: Once | ORAL | Status: AC
  Administered 2014-12-01: 2.5 mg via ORAL
  Filled 2014-12-01: qty 1

## 2014-12-01 MED ORDER — ACETAMINOPHEN 325 MG PO TABS: 650.0000 mg | ORAL_TABLET | ORAL | Status: DC | PRN

## 2014-12-01 MED ORDER — IOHEXOL 350 MG/ML SOLN
INTRAVENOUS | Status: DC | PRN
  Administered 2014-12-01: 95 mL via INTRA_ARTERIAL

## 2014-12-01 MED ORDER — LIDOCAINE HCL (PF) 1 % IJ SOLN
INTRAMUSCULAR | Status: AC
  Filled 2014-12-01: qty 30

## 2014-12-01 MED ORDER — VERAPAMIL HCL 2.5 MG/ML IV SOLN
INTRAVENOUS | Status: AC
  Filled 2014-12-01: qty 2

## 2014-12-01 MED ORDER — SODIUM CHLORIDE 0.9 % IJ SOLN: 3.0000 mL | INTRAMUSCULAR | Status: DC | PRN

## 2014-12-01 MED ORDER — SODIUM CHLORIDE 0.9 % IJ SOLN
3.0000 mL | Freq: Two times a day (BID) | INTRAMUSCULAR | Status: DC
  Administered 2014-12-01: 3 mL via INTRAVENOUS

## 2014-12-01 MED ORDER — MIDAZOLAM HCL 2 MG/2ML IJ SOLN
INTRAMUSCULAR | Status: DC | PRN
  Administered 2014-12-01: 1 mg via INTRAVENOUS

## 2014-12-01 MED ORDER — ONDANSETRON HCL 4 MG/2ML IJ SOLN: 4.0000 mg | Freq: Four times a day (QID) | INTRAMUSCULAR | Status: DC | PRN

## 2014-12-01 MED ORDER — VERAPAMIL HCL 2.5 MG/ML IV SOLN
INTRAVENOUS | Status: DC | PRN
  Administered 2014-12-01: 16:00:00 via INTRA_ARTERIAL

## 2014-12-01 MED ORDER — HEPARIN (PORCINE) IN NACL 100-0.45 UNIT/ML-% IJ SOLN
1200.0000 [IU]/h | INTRAMUSCULAR | Status: DC
  Administered 2014-12-01: 1200 [IU]/h via INTRAVENOUS
  Filled 2014-12-01 (×2): qty 250

## 2014-12-01 MED ORDER — HEPARIN (PORCINE) IN NACL 2-0.9 UNIT/ML-% IJ SOLN
INTRAMUSCULAR | Status: AC
  Filled 2014-12-01: qty 1500

## 2014-12-01 MED ORDER — HEPARIN SODIUM (PORCINE) 1000 UNIT/ML IJ SOLN
INTRAMUSCULAR | Status: AC
  Filled 2014-12-01: qty 1

## 2014-12-01 MED ORDER — FENTANYL CITRATE (PF) 100 MCG/2ML IJ SOLN
INTRAMUSCULAR | Status: DC | PRN
  Administered 2014-12-01: 25 ug via INTRAVENOUS

## 2014-12-01 SURGICAL SUPPLY — 16 items
CATH INFINITI 5 FR JL3.5 (CATHETERS) ×3 IMPLANT
CATH INFINITI 5FR ANG PIGTAIL (CATHETERS) ×3 IMPLANT
CATH INFINITI JR4 5F (CATHETERS) ×3 IMPLANT
CATH SWAN GANZ 7F STRAIGHT (CATHETERS) ×3 IMPLANT
DEVICE RAD COMP TR BAND LRG (VASCULAR PRODUCTS) ×3 IMPLANT
GLIDESHEATH SLEND SS 6F .021 (SHEATH) ×3 IMPLANT
GUIDEWIRE .025 260CM (WIRE) ×3 IMPLANT
KIT HEART LEFT (KITS) ×3 IMPLANT
PACK CARDIAC CATHETERIZATION (CUSTOM PROCEDURE TRAY) ×3 IMPLANT
SHEATH FAST CATH BRACH 5F 5CM (SHEATH) ×3 IMPLANT
SHEATH PINNACLE 7F 10CM (SHEATH) ×3 IMPLANT
SYR MEDRAD MARK V 150ML (SYRINGE) ×3 IMPLANT
TRANSDUCER W/STOPCOCK (MISCELLANEOUS) ×3 IMPLANT
TUBING CIL FLEX 10 FLL-RA (TUBING) ×3 IMPLANT
WIRE HI TORQ VERSACORE-J 145CM (WIRE) ×3 IMPLANT
WIRE SAFE-T 1.5MM-J .035X260CM (WIRE) ×3 IMPLANT

## 2014-12-01 NOTE — H&P (View-Only) (Signed)
Patient Name: Michael Randolph Date of Encounter: 12/01/2014  Primary Cardiologist: Dr. Acie Fredrickson   Principal Problem:   Acute CHF Active Problems:   DVT (deep venous thrombosis)   TIA (transient ischemic attack)   Diabetes mellitus without complication   Elevated troponin   Acute systolic CHF (congestive heart failure), NYHA class 3   NSTEMI (non-ST elevated myocardial infarction)    SUBJECTIVE Breathing better. Lying mostly flat when I entered the room. No CP. Has no questions about procedure, just states that the delays "caught him off guard."   CURRENT MEDS . aspirin  81 mg Oral Pre-Cath  . [START ON 12/02/2014] aspirin EC  81 mg Oral Daily  . atorvastatin  20 mg Oral q1800  . cholecalciferol  2,000 Units Oral Daily  . furosemide  20 mg Intravenous Daily  . sodium chloride  3 mL Intravenous Q12H  . sodium chloride  3 mL Intravenous Q12H    OBJECTIVE  Filed Vitals:   11/30/14 1337 11/30/14 2104 12/01/14 0205 12/01/14 0441  BP: 113/86 127/86 104/81 112/66  Pulse:  84 68 85  Temp:  98 F (36.7 C)  98.1 F (36.7 C)  TempSrc:  Oral  Oral  Resp:  18  18  Height:      Weight:    209 lb 8 oz (95.029 kg)  SpO2:  98% 99% 95%    Intake/Output Summary (Last 24 hours) at 12/01/14 0939 Last data filed at 12/01/14 0520  Gross per 24 hour  Intake   1110 ml  Output   2050 ml  Net   -940 ml   Filed Weights   11/30/14 0641 11/30/14 1216 12/01/14 0441  Weight: 210 lb 1.6 oz (95.301 kg) 210 lb 1.6 oz (95.301 kg) 209 lb 8 oz (95.029 kg)    PHYSICAL EXAM  General: Pleasant, NAD. Neuro: Alert and oriented X 3. Moves all extremities spontaneously. Psych: Normal affect. HEENT:  Normal  Neck: Supple without bruits or JVD. Lungs:  Resp regular and unlabored, CTA bilaterally with mild L basilar rale.  Heart: RRR no s3, s4, or murmurs appreciated. Abdomen: Soft, non-tender, non-distended, BS + x 4.  Extremities: No clubbing or cyanosis. DP/PT/Radials 2+ and equal bilaterally.  Trace edema.  Accessory Clinical Findings  CBC  Recent Labs  11/28/14 1851 11/30/14 0420  WBC 7.5 7.7  HGB 14.4 12.9*  HCT 43.5 39.7  MCV 80.0 79.9  PLT 202 846   Basic Metabolic Panel  Recent Labs  11/29/14 0557 11/30/14 0420 12/01/14 0334  NA 137 135 137  K 4.2 3.9 4.0  CL 101 102 105  CO2 28 23 23   GLUCOSE 126* 112* 101*  BUN 18 18 18   CREATININE 1.21 1.01 1.08  CALCIUM 9.6 9.0 9.1  MG 2.2  --   --    Cardiac Enzymes  Recent Labs  11/29/14 0029 11/29/14 0557 11/29/14 1158  TROPONINI 0.06* 0.06* 0.04*   Hemoglobin A1C  Recent Labs  11/29/14 0354  HGBA1C 6.1*   Fasting Lipid Panel  Recent Labs  11/29/14 0354  CHOL 201*  HDL 30*  LDLCALC 147*  TRIG 120  CHOLHDL 6.7   Thyroid Function Tests  Recent Labs  11/29/14 0354  TSH 4.347    TELE NSR 70s-80s. Occasional PVCs.    ECG No New EKG   Echocardiogram  11/29/2014  Study Conclusions  - Left ventricle: The cavity size was normal. Wall thickness was increased in a pattern of mild LVH. Systolic function was moderately  reduced. The estimated ejection fraction was in the range of 35% to 40%. Doppler parameters are consistent with restrictive physiology, indicative of decreased left ventricular diastolic compliance and/or increased left atrial pressure. - Aortic valve: There was trivial regurgitation. Valve area (VTI): 1.45 cm^2. Valve area (Vmax): 1.71 cm^2. Valve area (Vmean): 1.6 cm^2. - Mitral valve: Calcified annulus. There was mild regurgitation. - Left atrium: The atrium was mildly dilated.    Radiology/Studies  Dg Chest 2 View  11/28/2014   CLINICAL DATA:  LEFT side chest pain and shortness of breath for 10 days, history CHF, hypertension, diabetes  EXAM: CHEST  2 VIEW  COMPARISON:  None  FINDINGS: Slight rotation to the RIGHT on PA view.  Enlargement of cardiac silhouette.  Slight pulmonary vascular congestion.  Mediastinal contours normal.  Diffuse  interstitial infiltrates with suspect small bibasilar effusions question pulmonary edema/CHF.  Prominence of RIGHT hilum identified.  No pneumothorax.  Bones demineralized with scattered endplate spur formation thoracic spine.  IMPRESSION: Enlargement of cardiac silhouette with slight vascular congestion and scattered interstitial infiltrates stools due with type bibasilar effusions, favoring CHF.  RIGHT hilum is prominent, though this may be in part related to slight rotation to the RIGHT ; recommend followup radiographs in 2-4 weeks to reassess and exclude hilar mass/adenopathy.   Electronically Signed   By: Lavonia Dana M.D.   On: 11/28/2014 21:55    ASSESSMENT AND PLAN  Patient is a 80 y.o. male with a PMHx of DM, DVT, TIA , who was admitted to [facility] on 11/28/2014 for evaluation of dyspnea and cough.  1. Acute on chronic cobmined systolic and diastolic heart failure - Net fluids: -1.772L. Still on IV lasix.  - Echo 11/29/2014 EF 35-40% with no wall motion assessment due to quality.  - Cath today once INR below 2.0 to evaluate for IV dysfunction. INR down to 2.16.   - If LVEDP normal, can potentially switch to PO lasix.   2. Chest tightness - Cath today once INR <2. Troponin mildly elevated with new cardiomyopathy.   3. History of DVT (deep venous thrombosis)  - Coumadin held, currently on heparin  - plan for cath after INR <2.0, s/p vit K  4. History TIA (transient ischemic attack)  - Per primary team  5. Diabetes mellitus without complication  -Per primary team   Signed, Woodward Ku Pager: [phone]  The patient was seen, examined and discussed with Almyra Deforest, PA-C and I agree with the above.   80 year old male with admitted with acute CHF, new dg of moderate systolic dysfunction and mild troponin elevation, he is awaiting left cardiac cath scheduled for today, given 2 doses of vitamin K as the first dose brought INR down 2.7 --> 2.1, repeat INR at noon today,  he is NPO. He diuresed another liter overnight and feels better, should be able to lay flat during the procedure. Scheduled for a right and let cardiac cath.  Dorothy Spark 12/01/2014

## 2014-12-01 NOTE — Progress Notes (Signed)
ANTICOAGULATION CONSULT NOTE - Follow Up Consult  Pharmacy Consult for Heparin Indication: hx DVT/TIA, chest pain/ACS  Allergies  Allergen Reactions  . Actos [Pioglitazone] Other (See Comments)    fatigue  . Sulfa Antibiotics Other (See Comments)    hallucinations  . Dye Fdc Red [Red Dye] Rash    # 40    Patient Measurements: Height: 5\' 7"  (170.2 cm) Weight: 209 lb 8 oz (95.029 kg) (Scale A) IBW/kg (Calculated) : 66.1 Heparin Dosing Weight: ~86kg  Vital Signs: Temp: 98.1 F (36.7 C) (05/20 0441) Temp Source: Oral (05/20 0441) BP: 112/66 mmHg (05/20 0441) Pulse Rate: 85 (05/20 0441)  Labs:  Recent Labs  11/28/14 1851 11/29/14 0029 11/29/14 0557 11/29/14 1158 11/30/14 0420 12/01/14 0334  HGB 14.4  --   --   --  12.9*  --   HCT 43.5  --   --   --  39.7  --   PLT 202  --   --   --  171  --   LABPROT 29.6*  --  26.1*  --  29.4* 23.9*  INR 2.79*  --  2.37*  --  2.76* 2.16*  CREATININE 1.11  --  1.21  --  1.01 1.08  TROPONINI  --  0.06* 0.06* 0.04*  --   --     Estimated Creatinine Clearance: 62 mL/min (by C-G formula based on Cr of 1.08).  Assessment: 80yom on coumadin pta for hx DVT/TIA now being transitioned to heparin pending cardiac cath. INR 2.76 yesterday and he was given 2.5mg  vitamin k. INR down to 2.16 today and another 2.5mg  vitamin k given. Will start heparin as INR likely now below 2. CBC wnl.  Goal of Therapy:  Heparin level 0.3-0.7 units/ml Monitor platelets by anticoagulation protocol: Yes   Plan:  1) Begin heparin at 1200 units/hr with NO bolus 2) Check 8 hour heparin level 3) Daily heparin level and CBC 4) Follow up plans for cath  Deboraha Sprang 12/01/2014,8:49 AM

## 2014-12-01 NOTE — Progress Notes (Signed)
The patient had a 16 beat run of VTach. He was asymptomatic and his VS were stable and charted.  Michael Randolph was text paged.

## 2014-12-01 NOTE — Interval H&P Note (Signed)
Cath Lab Visit (complete for each Cath Lab visit)  Clinical Evaluation Leading to the Procedure:   ACS: Yes.    Non-ACS:    Anginal Classification: CCS IV  Anti-ischemic medical therapy: Minimal Therapy (1 class of medications)  Non-Invasive Test Results: No non-invasive testing performed  Prior CABG: No previous CABG   TIMI SCORE  Patient Information:  TIMI Score is 3  UA/NSTEMI and intermediate-risk features (e.g., TIMI score 3?4) for short-term risk of death or nonfatal MI  Revascularization of the presumed culprit artery   A (9)  Indication: 10; Score: 9    History and Physical Interval Note:  12/01/2014 4:03 PM  Michael Randolph  has presented today for surgery, with the diagnosis of c/p  The various methods of treatment have been discussed with the patient and family. After consideration of risks, benefits and other options for treatment, the patient has consented to  Procedure(s): Right/Left Heart Cath and Coronary Angiography (N/A) as a surgical intervention .  The patient's history has been reviewed, patient examined, no change in status, stable for surgery.  I have reviewed the patient's chart and labs.  Questions were answered to the patient's satisfaction.     Michael Tarrant S.

## 2014-12-01 NOTE — Progress Notes (Signed)
[facility]                                                                              Patient Demographics  Michael Randolph, is a 80 y.o. male, DOB - Jun 23, 1935, MPN:[id number]  Admit date - 11/28/2014   Admitting Physician Ivor Costa, MD  Outpatient Primary MD for the patient is [name], MD  LOS - 3   Chief Complaint  Patient presents with  . Shortness of Breath  . Chest Pain      HPI on 11/28/2014 by Dr. Otho Darner is a 80 y.o. male with PMH of diet controlled DM, Hx of DVT on Coumadin, hx of TIA, who presents with sob and cough. Patient report that he has been having SOB for about 9 days. He has left sided chest tightness. He has orthopnea at night. He has cough productive of yellow sputum. He has chronic leg edema due to DVT. Currently patient denies fever, chills, running nose, ear pain, headaches, abdominal pain, diarrhea, constipation, dysuria, urgency, frequency, hematuria, skin rashes. No unilateral weakness, numbness or tingling sensations. No vision change or hearing loss.  In ED, patient was found to have BMP 732.6, negative troponin, and INR 2.79, temperature normal, tachycardia, renal function okay. Chest x-ray is suggestive for congestive heart failure.  Assessment & Plan   Acute dyspnea secondary to combined systolic and diastolic heart failure -Improving  -Echocardiogram: EF of 35-40%, restrictive physiology indicative of decreased left ventricular diastolic compliance   -CXR: Enlargement of cardiac sihouette with vascular congestion, scattered interstitial infiltrates, ?CHF -Continue diuresis -Monitor daily weights, intake/output -UO over past 24 hours: 2087mL -Cardiology consulted and appreciated, pending further recommendations, cardiac cath today -Continue aspirin   Chest tightness/ ?Vtach -Has been worse with exertion and lying down; patient denies CP overnight. -Patient had another run of Vtach over night, 16 beats.  Patient  remained asymptomatic -CP has been ongoing since May 7th -Magnesium 2.2 -TSH 4.347 -Lipid panel: TC 201, TG 120, HDL 30, LDL 147 -Troponin 0.06-->0.04 -Cardiology consulted and appreciated, pending further recommendations, cath today  DVT -On coumadin as an outpatient, INR 2.1 (patient given Vitamin K 2.5mg  PO- 2 doses) -Repeat INR pending at noon -Will be transitioned to heparin once INR <2  History of TIA -Continue ASA  Diabetes mellitus, type 2 -HbA1c 6.1 -Continue ISS and CBG monitoring -Patient is not on medications at home, likely diet controlled?  Hyperlipidemia -Lipid panel: TC 201, TG 120, HDL 30, LDL 147 -Continue statin  Code Status: Full  Family Communication: Wife at bedside  Disposition Plan: Admitted, pending right and left cardiac catheterization  Time Spent in minutes   30 minutes  Procedures  Echocardiogram  Consults   Cardiology  DVT Prophylaxis  INR therapeutic, Coumadin held, Heparin to be started when INR <2  Lab Results  Component Value Date   PLT 171 11/30/2014    Medications  Scheduled Meds: . aspirin  81 mg Oral Pre-Cath  . [START ON 12/02/2014] aspirin EC  81 mg Oral Daily  . atorvastatin  20 mg Oral q1800  . cholecalciferol  2,000 Units Oral Daily  . furosemide  20 mg Intravenous Daily  .  phytonadione  2.5 mg Oral Once  . sodium chloride  3 mL Intravenous Q12H  . sodium chloride  3 mL Intravenous Q12H   Continuous Infusions: . sodium chloride     PRN Meds:.sodium chloride, sodium chloride, acetaminophen, ondansetron (ZOFRAN) IV, sodium chloride, sodium chloride  Antibiotics    Anti-infectives    None      Subjective:   Michael Randolph seen and examined today.  Patient denies a chest pain or tightness overnight. Does state he is upset that his INR is still high, and that it is taking so long. Patient would like to have his catheterization down and over with. Patient is trying to calculate how quickly his INR will drop  after taking vitamin K. Currently denies any shortness of breath, chest pain, dizziness, headache, abdominal pain.  Objective:   Filed Vitals:   11/30/14 1337 11/30/14 2104 12/01/14 0205 12/01/14 0441  BP: 113/86 127/86 104/81 112/66  Pulse:  84 68 85  Temp:  98 F (36.7 C)  98.1 F (36.7 C)  TempSrc:  Oral  Oral  Resp:  18  18  Height:      Weight:    95.029 kg (209 lb 8 oz)  SpO2:  98% 99% 95%    Wt Readings from Last 3 Encounters:  12/01/14 95.029 kg (209 lb 8 oz)     Intake/Output Summary (Last 24 hours) at 12/01/14 0832 Last data filed at 12/01/14 0520  Gross per 24 hour  Intake   1110 ml  Output   2050 ml  Net   -940 ml    Exam  General: Well developed, well nourished, no distress  HEENT: NCAT, mucous membranes moist.   Cardiovascular: S1 S2 auscultated, RRR, no murmurs  Respiratory: Mild left rale, otherwise clear.  Abdomen: Soft, nontender, nondistended, + bowel sounds  Extremities: warm dry without cyanosis clubbing. Trace edema in LE B/L  Neuro: AAOx3, nonfocal   Psych: Appropriate   Data Review   Micro Results No results found for this or any previous visit (from the past 240 hour(s)).  Radiology Reports Dg Chest 2 View  11/28/2014   CLINICAL DATA:  LEFT side chest pain and shortness of breath for 10 days, history CHF, hypertension, diabetes  EXAM: CHEST  2 VIEW  COMPARISON:  None  FINDINGS: Slight rotation to the RIGHT on PA view.  Enlargement of cardiac silhouette.  Slight pulmonary vascular congestion.  Mediastinal contours normal.  Diffuse interstitial infiltrates with suspect small bibasilar effusions question pulmonary edema/CHF.  Prominence of RIGHT hilum identified.  No pneumothorax.  Bones demineralized with scattered endplate spur formation thoracic spine.  IMPRESSION: Enlargement of cardiac silhouette with slight vascular congestion and scattered interstitial infiltrates stools due with type bibasilar effusions, favoring CHF.  RIGHT hilum  is prominent, though this may be in part related to slight rotation to the RIGHT ; recommend followup radiographs in 2-4 weeks to reassess and exclude hilar mass/adenopathy.   Electronically Signed   By: Lavonia Dana M.D.   On: 11/28/2014 21:55    CBC  Recent Labs Lab 11/28/14 1851 11/30/14 0420  WBC 7.5 7.7  HGB 14.4 12.9*  HCT 43.5 39.7  PLT 202 171  MCV 80.0 79.9  MCH 26.5 26.0  MCHC 33.1 32.5  RDW 13.6 13.6    Chemistries   Recent Labs Lab 11/28/14 1851 11/29/14 0557 11/30/14 0420 12/01/14 0334  NA 138 137 135 137  K 4.3 4.2 3.9 4.0  CL 103 101 102 105  CO2  23 28 23 23   GLUCOSE 121* 126* 112* 101*  BUN 20 18 18 18   CREATININE 1.11 1.21 1.01 1.08  CALCIUM 9.3 9.6 9.0 9.1  MG  --  2.2  --   --    ------------------------------------------------------------------------------------------------------------------ estimated creatinine clearance is 62 mL/min (by C-G formula based on Cr of 1.08). ------------------------------------------------------------------------------------------------------------------  Recent Labs  11/29/14 0354  HGBA1C 6.1*   ------------------------------------------------------------------------------------------------------------------  Recent Labs  11/29/14 0354  CHOL 201*  HDL 30*  LDLCALC 147*  TRIG 120  CHOLHDL 6.7   ------------------------------------------------------------------------------------------------------------------  Recent Labs  11/29/14 0354  TSH 4.347   ------------------------------------------------------------------------------------------------------------------ No results for input(s): VITAMINB12, FOLATE, FERRITIN, TIBC, IRON, RETICCTPCT in the last 72 hours.  Coagulation profile  Recent Labs Lab 11/28/14 1851 11/29/14 0557 11/30/14 0420 12/01/14 0334  INR 2.79* 2.37* 2.76* 2.16*    No results for input(s): DDIMER in the last 72 hours.  Cardiac Enzymes  Recent Labs Lab 11/29/14 0029  11/29/14 0557 11/29/14 1158  TROPONINI 0.06* 0.06* 0.04*   ------------------------------------------------------------------------------------------------------------------ Invalid input(s): POCBNP    Michael Randolph D.O. on 12/01/2014 at 8:32 AM  Between 7am to 7pm - Pager - [phone]  After 7pm go to [email or web address] - password [email or web address]  And look for the night coverage person covering for me after hours  [facility] Office  [phone]

## 2014-12-01 NOTE — Progress Notes (Signed)
Site area: Left AC venous sheath Site Prior to Removal:  Level 0 Pressure Applied For:10 min Manual:  yes  Patient Status During Pull:  stable Post Pull Site:  Level 0 Post Pull Instructions Given:yes   Post Pull Pulses Present: N/A Dressing Applied: clear/coban  Bedrest begins @ 4604 Comments:

## 2014-12-01 NOTE — Progress Notes (Signed)
The patient has been NPO since midnight and his consent is signed and in the chart.

## 2014-12-01 NOTE — Progress Notes (Signed)
Patient Name: Michael Randolph Date of Encounter: 12/01/2014  Primary Cardiologist: Dr. Acie Fredrickson   Principal Problem:   Acute CHF Active Problems:   DVT (deep venous thrombosis)   TIA (transient ischemic attack)   Diabetes mellitus without complication   Elevated troponin   Acute systolic CHF (congestive heart failure), NYHA class 3   NSTEMI (non-ST elevated myocardial infarction)    SUBJECTIVE Breathing better. Lying mostly flat when I entered the room. No CP. Has no questions about procedure, just states that the delays "caught him off guard."   CURRENT MEDS . aspirin  81 mg Oral Pre-Cath  . [START ON 12/02/2014] aspirin EC  81 mg Oral Daily  . atorvastatin  20 mg Oral q1800  . cholecalciferol  2,000 Units Oral Daily  . furosemide  20 mg Intravenous Daily  . sodium chloride  3 mL Intravenous Q12H  . sodium chloride  3 mL Intravenous Q12H    OBJECTIVE  Filed Vitals:   11/30/14 1337 11/30/14 2104 12/01/14 0205 12/01/14 0441  BP: 113/86 127/86 104/81 112/66  Pulse:  84 68 85  Temp:  98 F (36.7 C)  98.1 F (36.7 C)  TempSrc:  Oral  Oral  Resp:  18  18  Height:      Weight:    209 lb 8 oz (95.029 kg)  SpO2:  98% 99% 95%    Intake/Output Summary (Last 24 hours) at 12/01/14 0939 Last data filed at 12/01/14 0520  Gross per 24 hour  Intake   1110 ml  Output   2050 ml  Net   -940 ml   Filed Weights   11/30/14 0641 11/30/14 1216 12/01/14 0441  Weight: 210 lb 1.6 oz (95.301 kg) 210 lb 1.6 oz (95.301 kg) 209 lb 8 oz (95.029 kg)    PHYSICAL EXAM  General: Pleasant, NAD. Neuro: Alert and oriented X 3. Moves all extremities spontaneously. Psych: Normal affect. HEENT:  Normal  Neck: Supple without bruits or JVD. Lungs:  Resp regular and unlabored, CTA bilaterally with mild L basilar rale.  Heart: RRR no s3, s4, or murmurs appreciated. Abdomen: Soft, non-tender, non-distended, BS + x 4.  Extremities: No clubbing or cyanosis. DP/PT/Radials 2+ and equal bilaterally.  Trace edema.  Accessory Clinical Findings  CBC  Recent Labs  11/28/14 1851 11/30/14 0420  WBC 7.5 7.7  HGB 14.4 12.9*  HCT 43.5 39.7  MCV 80.0 79.9  PLT 202 751   Basic Metabolic Panel  Recent Labs  11/29/14 0557 11/30/14 0420 12/01/14 0334  NA 137 135 137  K 4.2 3.9 4.0  CL 101 102 105  CO2 28 23 23   GLUCOSE 126* 112* 101*  BUN 18 18 18   CREATININE 1.21 1.01 1.08  CALCIUM 9.6 9.0 9.1  MG 2.2  --   --    Cardiac Enzymes  Recent Labs  11/29/14 0029 11/29/14 0557 11/29/14 1158  TROPONINI 0.06* 0.06* 0.04*   Hemoglobin A1C  Recent Labs  11/29/14 0354  HGBA1C 6.1*   Fasting Lipid Panel  Recent Labs  11/29/14 0354  CHOL 201*  HDL 30*  LDLCALC 147*  TRIG 120  CHOLHDL 6.7   Thyroid Function Tests  Recent Labs  11/29/14 0354  TSH 4.347    TELE NSR 70s-80s. Occasional PVCs.    ECG No New EKG   Echocardiogram  11/29/2014  Study Conclusions  - Left ventricle: The cavity size was normal. Wall thickness was increased in a pattern of mild LVH. Systolic function was moderately  reduced. The estimated ejection fraction was in the range of 35% to 40%. Doppler parameters are consistent with restrictive physiology, indicative of decreased left ventricular diastolic compliance and/or increased left atrial pressure. - Aortic valve: There was trivial regurgitation. Valve area (VTI): 1.45 cm^2. Valve area (Vmax): 1.71 cm^2. Valve area (Vmean): 1.6 cm^2. - Mitral valve: Calcified annulus. There was mild regurgitation. - Left atrium: The atrium was mildly dilated.    Radiology/Studies  Dg Chest 2 View  11/28/2014   CLINICAL DATA:  LEFT side chest pain and shortness of breath for 10 days, history CHF, hypertension, diabetes  EXAM: CHEST  2 VIEW  COMPARISON:  None  FINDINGS: Slight rotation to the RIGHT on PA view.  Enlargement of cardiac silhouette.  Slight pulmonary vascular congestion.  Mediastinal contours normal.  Diffuse  interstitial infiltrates with suspect small bibasilar effusions question pulmonary edema/CHF.  Prominence of RIGHT hilum identified.  No pneumothorax.  Bones demineralized with scattered endplate spur formation thoracic spine.  IMPRESSION: Enlargement of cardiac silhouette with slight vascular congestion and scattered interstitial infiltrates stools due with type bibasilar effusions, favoring CHF.  RIGHT hilum is prominent, though this may be in part related to slight rotation to the RIGHT ; recommend followup radiographs in 2-4 weeks to reassess and exclude hilar mass/adenopathy.   Electronically Signed   By: Lavonia Dana M.D.   On: 11/28/2014 21:55    ASSESSMENT AND PLAN  Patient is a 80 y.o. male with a PMHx of DM, DVT, TIA , who was admitted to [facility] on 11/28/2014 for evaluation of dyspnea and cough.  1. Acute on chronic cobmined systolic and diastolic heart failure - Net fluids: -1.772L. Still on IV lasix.  - Echo 11/29/2014 EF 35-40% with no wall motion assessment due to quality.  - Cath today once INR below 2.0 to evaluate for IV dysfunction. INR down to 2.16.   - If LVEDP normal, can potentially switch to PO lasix.   2. Chest tightness - Cath today once INR <2. Troponin mildly elevated with new cardiomyopathy.   3. History of DVT (deep venous thrombosis)  - Coumadin held, currently on heparin  - plan for cath after INR <2.0, s/p vit K  4. History TIA (transient ischemic attack)  - Per primary team  5. Diabetes mellitus without complication  -Per primary team   Signed, Woodward Ku Pager: [phone]  The patient was seen, examined and discussed with Almyra Deforest, PA-C and I agree with the above.   80 year old male with admitted with acute CHF, new dg of moderate systolic dysfunction and mild troponin elevation, he is awaiting left cardiac cath scheduled for today, given 2 doses of vitamin K as the first dose brought INR down 2.7 --> 2.1, repeat INR at noon today,  he is NPO. He diuresed another liter overnight and feels better, should be able to lay flat during the procedure. Scheduled for a right and let cardiac cath.  Dorothy Spark 12/01/2014

## 2014-12-02 DIAGNOSIS — I429 Cardiomyopathy, unspecified: Secondary | ICD-10-CM

## 2014-12-02 DIAGNOSIS — I214 Non-ST elevation (NSTEMI) myocardial infarction: Secondary | ICD-10-CM

## 2014-12-02 LAB — PROTIME-INR
INR: 1.49 (ref 0.00–1.49)
Prothrombin Time: 18 seconds — ABNORMAL HIGH (ref 11.6–15.2)

## 2014-12-02 LAB — CBC
HEMATOCRIT: 43.9 % (ref 39.0–52.0)
Hemoglobin: 14.3 g/dL (ref 13.0–17.0)
MCH: 26.3 pg (ref 26.0–34.0)
MCHC: 32.6 g/dL (ref 30.0–36.0)
MCV: 80.8 fL (ref 78.0–100.0)
PLATELETS: 196 10*3/uL (ref 150–400)
RBC: 5.43 MIL/uL (ref 4.22–5.81)
RDW: 13.7 % (ref 11.5–15.5)
WBC: 7.8 10*3/uL (ref 4.0–10.5)

## 2014-12-02 LAB — GLUCOSE, CAPILLARY: Glucose-Capillary: 143 mg/dL — ABNORMAL HIGH (ref 65–99)

## 2014-12-02 LAB — BASIC METABOLIC PANEL
ANION GAP: 9 (ref 5–15)
BUN: 17 mg/dL (ref 6–20)
CHLORIDE: 101 mmol/L (ref 101–111)
CO2: 28 mmol/L (ref 22–32)
CREATININE: 1.14 mg/dL (ref 0.61–1.24)
Calcium: 9.2 mg/dL (ref 8.9–10.3)
GFR calc Af Amer: >60 mL/min (ref >60)
GFR calc non Af Amer: 60 mL/min — ABNORMAL LOW (ref >60)
Glucose, Bld: 109 mg/dL — ABNORMAL HIGH (ref 65–99)
POTASSIUM: 3.9 mmol/L (ref 3.5–5.1)
Sodium: 138 mmol/L (ref 135–145)

## 2014-12-02 LAB — HEPARIN LEVEL (UNFRACTIONATED): Heparin Unfractionated: 0.37 IU/mL (ref 0.30–0.70)

## 2014-12-02 MED ORDER — METOPROLOL SUCCINATE 12.5 MG HALF TABLET
12.5000 mg | ORAL_TABLET | Freq: Every day | ORAL | Status: DC
  Filled 2014-12-02: qty 1

## 2014-12-02 MED ORDER — LISINOPRIL 5 MG PO TABS
5.0000 mg | ORAL_TABLET | Freq: Every day | ORAL | Status: DC
  Filled 2014-12-02: qty 1

## 2014-12-02 MED ORDER — FUROSEMIDE 20 MG PO TABS
20.0000 mg | ORAL_TABLET | Freq: Two times a day (BID) | ORAL | Status: DC
  Filled 2014-12-02 (×2): qty 1

## 2014-12-02 MED ORDER — METOPROLOL SUCCINATE ER 25 MG PO TB24: 12.5000 mg | ORAL_TABLET | Freq: Every day | ORAL | Status: DC

## 2014-12-02 MED ORDER — WARFARIN SODIUM 5 MG PO TABS
5.0000 mg | ORAL_TABLET | ORAL | Status: DC
  Filled 2014-12-02: qty 1

## 2014-12-02 MED ORDER — WARFARIN SODIUM 7.5 MG PO TABS
7.5000 mg | ORAL_TABLET | ORAL | Status: DC
  Filled 2014-12-02: qty 1

## 2014-12-02 MED ORDER — FUROSEMIDE 20 MG PO TABS: 20.0000 mg | ORAL_TABLET | Freq: Two times a day (BID) | ORAL | Status: DC

## 2014-12-02 MED ORDER — LISINOPRIL 5 MG PO TABS: 5.0000 mg | ORAL_TABLET | Freq: Every day | ORAL | Status: DC

## 2014-12-02 MED ORDER — LISINOPRIL 2.5 MG PO TABS: 2.5000 mg | ORAL_TABLET | Freq: Every day | ORAL | Status: DC

## 2014-12-02 MED ORDER — WARFARIN - PHARMACIST DOSING INPATIENT: Freq: Every day | Status: DC

## 2014-12-02 MED ORDER — ATORVASTATIN CALCIUM 20 MG PO TABS: 20.0000 mg | ORAL_TABLET | Freq: Every day | ORAL | Status: DC

## 2014-12-02 NOTE — Progress Notes (Signed)
SUBJECTIVE: Feeling much better. Able to lie flat. Can take deep breaths. Describes flu-like illness in November and feeling progressively more short of breath since then (viral-induced cardiomyopathy?). Cath with no significant CAD.     Intake/Output Summary (Last 24 hours) at 12/02/14 1241 Last data filed at 12/02/14 0100  Gross per 24 hour  Intake  517.4 ml  Output    100 ml  Net  417.4 ml    Current Facility-Administered Medications  Medication Dose Route Frequency Provider Last Rate Last Dose  . 0.9 %  sodium chloride infusion  250 mL Intravenous PRN Ivor Costa, MD      . 0.9 %  sodium chloride infusion  250 mL Intravenous PRN Jettie Booze, MD      . acetaminophen (TYLENOL) tablet 650 mg  650 mg Oral Q4H PRN Ivor Costa, MD      . aspirin EC tablet 81 mg  81 mg Oral Daily Skeet Simmer, RPH   81 mg at 12/02/14 1026  . atorvastatin (LIPITOR) tablet 20 mg  20 mg Oral q1800 Maryann Mikhail, DO   20 mg at 12/01/14 1931  . cholecalciferol (VITAMIN D) tablet 2,000 Units  2,000 Units Oral Daily Ivor Costa, MD   2,000 Units at 12/02/14 1026  . furosemide (LASIX) injection 20 mg  20 mg Intravenous Daily Ivor Costa, MD   20 mg at 12/01/14 0948  . heparin ADULT infusion 100 units/mL (25000 units/250 mL)  1,200 Units/hr Intravenous Continuous Jettie Booze, MD 12 mL/hr at 12/01/14 2210 1,200 Units/hr at 12/01/14 2210  . lisinopril (PRINIVIL,ZESTRIL) tablet 5 mg  5 mg Oral Daily Maryann Mikhail, DO      . ondansetron (ZOFRAN) injection 4 mg  4 mg Intravenous Q6H PRN Ivor Costa, MD      . sodium chloride 0.9 % injection 3 mL  3 mL Intravenous Q12H Ivor Costa, MD   3 mL at 12/01/14 0948  . sodium chloride 0.9 % injection 3 mL  3 mL Intravenous PRN Ivor Costa, MD      . sodium chloride 0.9 % injection 3 mL  3 mL Intravenous Q12H Jettie Booze, MD   3 mL at 12/01/14 2210  . sodium chloride 0.9 % injection 3 mL  3 mL Intravenous PRN Jettie Booze, MD      . warfarin  (COUMADIN) tablet 7.5 mg  7.5 mg Oral NOW Maryann Mikhail, DO      . Warfarin - Pharmacist Dosing Inpatient   Does not apply q1800 Maryann Mikhail, DO   0  at 12/02/14 1800    Filed Vitals:   12/01/14 1806 12/01/14 2157 12/02/14 0245 12/02/14 0619  BP: 111/76 91/72 101/63 99/73  Pulse: 84 85 71 53  Temp:  98 F (36.7 C) 98.3 F (36.8 C) 98.5 F (36.9 C)  TempSrc:  Oral Oral Oral  Resp:  22 20 20   Height:      Weight:    209 lb 7 oz (95 kg)  SpO2:  99% 97% 99%    PHYSICAL EXAM General: NAD HEENT: Normal. Neck: No JVD, no thyromegaly.  Lungs: Clear to auscultation bilaterally with normal respiratory effort. CV: Regular rate and rhythm (HR 82 bpm), normal S1/S2, no S3/S4, no murmur.  No pretibial edema.   Abdomen: Soft, obese, no distention.  Neurologic: Alert and oriented x 3.  Psych: Normal affect. Musculoskeletal: Normal range of motion. No gross deformities. Extremities: No clubbing or cyanosis.  LABS: Basic Metabolic Panel:  Recent Labs  12/01/14 0334 12/02/14 0543  NA 137 138  K 4.0 3.9  CL 105 101  CO2 23 28  GLUCOSE 101* 109*  BUN 18 17  CREATININE 1.08 1.14  CALCIUM 9.1 9.2   Liver Function Tests: No results for input(s): AST, ALT, ALKPHOS, BILITOT, PROT, ALBUMIN in the last 72 hours. No results for input(s): LIPASE, AMYLASE in the last 72 hours. CBC:  Recent Labs  11/30/14 0420 12/02/14 0543  WBC 7.7 7.8  HGB 12.9* 14.3  HCT 39.7 43.9  MCV 79.9 80.8  PLT 171 196   Cardiac Enzymes: No results for input(s): CKTOTAL, CKMB, CKMBINDEX, TROPONINI in the last 72 hours. BNP: Invalid input(s): POCBNP D-Dimer: No results for input(s): DDIMER in the last 72 hours. Hemoglobin A1C: No results for input(s): HGBA1C in the last 72 hours. Fasting Lipid Panel: No results for input(s): CHOL, HDL, LDLCALC, TRIG, CHOLHDL, LDLDIRECT in the last 72 hours. Thyroid Function Tests: No results for input(s): TSH, T4TOTAL, T3FREE, THYROIDAB in the last 72  hours.  Invalid input(s): FREET3 Anemia Panel: No results for input(s): VITAMINB12, FOLATE, FERRITIN, TIBC, IRON, RETICCTPCT in the last 72 hours.  RADIOLOGY: Dg Chest 2 View  11/28/2014   CLINICAL DATA:  LEFT side chest pain and shortness of breath for 10 days, history CHF, hypertension, diabetes  EXAM: CHEST  2 VIEW  COMPARISON:  None  FINDINGS: Slight rotation to the RIGHT on PA view.  Enlargement of cardiac silhouette.  Slight pulmonary vascular congestion.  Mediastinal contours normal.  Diffuse interstitial infiltrates with suspect small bibasilar effusions question pulmonary edema/CHF.  Prominence of RIGHT hilum identified.  No pneumothorax.  Bones demineralized with scattered endplate spur formation thoracic spine.  IMPRESSION: Enlargement of cardiac silhouette with slight vascular congestion and scattered interstitial infiltrates stools due with type bibasilar effusions, favoring CHF.  RIGHT hilum is prominent, though this may be in part related to slight rotation to the RIGHT ; recommend followup radiographs in 2-4 weeks to reassess and exclude hilar mass/adenopathy.   Electronically Signed   By: Lavonia Dana M.D.   On: 11/28/2014 21:55      ASSESSMENT AND PLAN: 1. Acute on chronic systolic heart failure/NICM: On IV Lasix 20 mg daily, I/O show net fluid +417. Wt 209 lbs. Symptomatically improved. Will switch to oral Lasix 20 mg bid. Will need KCl supplementation. On ACEI. Given HR 82 bpm and cardiomyopathy, will try low-dose Toprol-XL 12.5 mg daily if BP will tolerate.  2. Recurrent DVT: INR 1.49, on heparin and warfarin.  3. TIA: Would recommend statin therapy, but it appears he has myalgias related to this and would prefer not to take.  Dispo: Can f/u in [facility] office.  Kate Sable, M.D., F.A.C.C.

## 2014-12-02 NOTE — Discharge Instructions (Signed)

## 2014-12-02 NOTE — Discharge Summary (Addendum)
Physician Discharge Summary  Michael Randolph ZOX:[id number] DOB: March 16, 1935 DOA: 11/28/2014  PCP: [name], MD  Admit date: 11/28/2014 Discharge date: 12/02/2014  Time spent: 45 minutes  Recommendations for Outpatient Follow-up:  Patient will be discharged to home.  Patient will need to follow up with primary care provider within one week of discharge.  Patient will also need to follow up with cardiology, Dr. Acie Fredrickson, office will set up appointment.  Patient should continue medications as prescribed.  Patient should follow a heart healthy diet.  He will need to have his INR rechecked on Monday 12/04/2014.  Discharge Diagnoses:  Acute dyspnea secondary to combined systolic and diastolic heart failure Chest tightness DVT History of TIA Diabetes mellitus, type II Hyperlipidemia  Discharge Condition: Stable  Diet recommendation: Heart healthy  Filed Weights   11/30/14 1216 12/01/14 0441 12/02/14 0619  Weight: 95.301 kg (210 lb 1.6 oz) 95.029 kg (209 lb 8 oz) 95 kg (209 lb 7 oz)    History of present illness:  on 11/28/2014 by Dr. Otho Darner is a 80 y.o. male with PMH of diet controlled DM, Hx of DVT on Coumadin, hx of TIA, who presents with sob and cough. Patient report that he has been having SOB for about 9 days. He has left sided chest tightness. He has orthopnea at night. He has cough productive of yellow sputum. He has chronic leg edema due to DVT. Currently patient denies fever, chills, running nose, ear pain, headaches, abdominal pain, diarrhea, constipation, dysuria, urgency, frequency, hematuria, skin rashes. No unilateral weakness, numbness or tingling sensations. No vision change or hearing loss. In ED, patient was found to have BMP 732.6, negative troponin, and INR 2.79, temperature normal, tachycardia, renal function okay. Chest x-ray is suggestive for congestive heart failure.  Hospital Course:  Acute dyspnea secondary to combined systolic and diastolic  heart failure -Improving  -Echocardiogram: EF of 35-40%, restrictive physiology indicative of decreased left ventricular diastolic compliance  -CXR: Enlargement of cardiac sihouette with vascular congestion, scattered interstitial infiltrates, ?CHF -Continue diuresis -Monitored daily weights, intake/output -Cardiology consulted and appreciated, pending further recommendations, cardiac cath  -Continue aspirin  -Patient will be discharged on Lasix 20 mg twice a day, Toprol-XL 12.5 mg daily, lisinopril  Chest tightness/ ?Vtach/ Elevated troponin/ UA-NSTEMI -Has been worse with exertion and lying down; patient denies CP overnight. -Patient had another run of Vtach over night, 16 beats. Patient remained asymptomatic -CP has been ongoing since May 7th -Magnesium 2.2 -TSH 4.347 -Lipid panel: TC 201, TG 120, HDL 30, LDL 147 -Troponin 0.06-->0.04 -Cardiology consulted and appreciated, pending further recommendations -Cardaic cath showed mild, nonobstructive coronary artery disease, severe left ventricular dysfunction EF 20-25%, nonischemic cardiomyopathy, moderate pulmonary hypertension  -Started patient on lisinopril 0  DVT -On coumadin as an outpatient, INR 1.49 (patient given Vitamin K 2.5mg  PO- 2 doses) -Placed on heparin for heart carth -May restart coumadin and follow up with PCP on Monday for INR check  History of TIA -Continue ASA  Diabetes mellitus, type 2 -HbA1c 6.1 -Continue ISS and CBG monitoring -Patient is not on medications at home, likely diet controlled?  Hyperlipidemia -Lipid panel: TC 201, TG 120, HDL 30, LDL 147 -Continue statin -Patient reluctant on using statins due to history of myalgias and rash, advised patient to speak to cardiology versus his primary care physician.  Procedures  Echocardiogram Right/left heart catheterization and coronary angiography   Consults  Cardiology  Discharge Exam: Filed Vitals:   12/02/14 0619  BP: 99/73  Pulse: 53    Temp: 98.5 F (36.9 C)  Resp: 20   Exam  General: Well developed, well nourished, no distress  HEENT: NCAT, mucous membranes moist.   Cardiovascular: S1 S2 auscultated, RRR, no murmurs  Respiratory: Mild left rale, otherwise clear.  Abdomen: Soft, nontender, nondistended, + bowel sounds  Extremities: warm dry without cyanosis clubbing. Trace edema in LE B/L  Neuro: AAOx3, nonfocal  Psych: Appropriate  Discharge Instructions      Discharge Instructions    Discharge instructions    Complete by:  As directed   Patient will be discharged to home.  Patient will need to follow up with primary care provider within one week of discharge.  Patient will also need to follow up with cardiology, Dr. Acie Fredrickson, office will set up appointment.  Patient should continue medications as prescribed.  Patient should follow a heart healthy diet.  He will need to have his INR rechecked on Monday 12/04/2014.            Medication List    TAKE these medications        aspirin 81 MG tablet  Take 81 mg by mouth daily.     atorvastatin 20 MG tablet  Commonly known as:  LIPITOR  Take 1 tablet (20 mg total) by mouth daily at 6 PM.     furosemide 20 MG tablet  Commonly known as:  LASIX  Take 1 tablet (20 mg total) by mouth 2 (two) times daily.     GARLIC PO  Take 1 tablet by mouth daily.     lisinopril 2.5 MG tablet  Commonly known as:  ZESTRIL  Take 1 tablet (2.5 mg total) by mouth daily.     metoprolol succinate 25 MG 24 hr tablet  Commonly known as:  TOPROL-XL  Take 0.5 tablets (12.5 mg total) by mouth daily.     Vitamin D-3 1000 UNITS Caps  Take 2 capsules by mouth daily.     warfarin 5 MG tablet  Commonly known as:  COUMADIN  Take 2.5-5 mg by mouth daily. Take a whole tablet every day except on mondays and fridays take 2.5mg        Allergies  Allergen Reactions  . Actos [Pioglitazone] Other (See Comments)    fatigue  . Sulfa Antibiotics Other (See Comments)     hallucinations  . Dye Fdc Red [Red Dye] Rash    # 40   Follow-up Information    Follow up with [name], MD. Schedule an appointment as soon as possible for a visit in 1 week.   Specialty:  Internal Medicine   Why:  Hospital follow up, INR check   Contact information:   [place] [id number] [phone]       Follow up with Nahser, Wonda Cheng, MD. Schedule an appointment as soon as possible for a visit in 2 weeks.   Specialty:  Cardiology   Why:  Hospital followup   Contact information:   [place] [address] [phone]        The results of significant diagnostics from this hospitalization (including imaging, microbiology, ancillary and laboratory) are listed below for reference.    Significant Diagnostic Studies: Dg Chest 2 View  11/28/2014   CLINICAL DATA:  LEFT side chest pain and shortness of breath for 10 days, history CHF, hypertension, diabetes  EXAM: CHEST  2 VIEW  COMPARISON:  None  FINDINGS: Slight rotation to the RIGHT on PA view.  Enlargement of cardiac silhouette.  Slight pulmonary vascular congestion.  Mediastinal contours normal.  Diffuse interstitial infiltrates with suspect small bibasilar effusions question pulmonary edema/CHF.  Prominence of RIGHT hilum identified.  No pneumothorax.  Bones demineralized with scattered endplate spur formation thoracic spine.  IMPRESSION: Enlargement of cardiac silhouette with slight vascular congestion and scattered interstitial infiltrates stools due with type bibasilar effusions, favoring CHF.  RIGHT hilum is prominent, though this may be in part related to slight rotation to the RIGHT ; recommend followup radiographs in 2-4 weeks to reassess and exclude hilar mass/adenopathy.   Electronically Signed   By: Lavonia Dana M.D.   On: 11/28/2014 21:55    Microbiology: No results found for this or any previous visit (from the past 240 hour(s)).   Labs: Basic Metabolic  Panel:  Recent Labs Lab 11/28/14 1851 11/29/14 0557 11/30/14 0420 12/01/14 0334 12/02/14 0543  NA 138 137 135 137 138  K 4.3 4.2 3.9 4.0 3.9  CL 103 101 102 105 101  CO2 23 28 23 23 28   GLUCOSE 121* 126* 112* 101* 109*  BUN 20 18 18 18 17   CREATININE 1.11 1.21 1.01 1.08 1.14  CALCIUM 9.3 9.6 9.0 9.1 9.2  MG  --  2.2  --   --   --    Liver Function Tests: No results for input(s): AST, ALT, ALKPHOS, BILITOT, PROT, ALBUMIN in the last 168 hours. No results for input(s): LIPASE, AMYLASE in the last 168 hours. No results for input(s): AMMONIA in the last 168 hours. CBC:  Recent Labs Lab 11/28/14 1851 11/30/14 0420 12/02/14 0543  WBC 7.5 7.7 7.8  HGB 14.4 12.9* 14.3  HCT 43.5 39.7 43.9  MCV 80.0 79.9 80.8  PLT 202 171 196   Cardiac Enzymes:  Recent Labs Lab 11/29/14 0029 11/29/14 0557 11/29/14 1158  TROPONINI 0.06* 0.06* 0.04*   BNP: BNP (last 3 results)  Recent Labs  11/28/14 1851  BNP 732.6*    ProBNP (last 3 results) No results for input(s): PROBNP in the last 8760 hours.  CBG:  Recent Labs Lab 11/29/14 0639 11/30/14 0658 12/01/14 0629 12/01/14 1719 12/02/14 0721  GLUCAP 126* 132* 119* 125* 143*       Signed:  Cristal Ford  [facility] 12/02/2014, 12:59 PM

## 2014-12-02 NOTE — Progress Notes (Addendum)
ANTICOAGULATION CONSULT NOTE - Follow Up Consult  Pharmacy Consult for Heparin Indication: hx DVT/TIA, chest pain/ACS  Allergies  Allergen Reactions  . Actos [Pioglitazone] Other (See Comments)    fatigue  . Sulfa Antibiotics Other (See Comments)    hallucinations  . Dye Fdc Red [Red Dye] Rash    # 40    Patient Measurements: Height: 5\' 7"  (170.2 cm) Weight: 209 lb 7 oz (95 kg) (Scale A) IBW/kg (Calculated) : 66.1 Heparin Dosing Weight: ~86kg  Vital Signs: Temp: 98.5 F (36.9 C) (05/21 0619) Temp Source: Oral (05/21 0619) BP: 99/73 mmHg (05/21 0619) Pulse Rate: 53 (05/21 0619)  Labs:  Recent Labs  11/29/14 1158  11/30/14 0420 12/01/14 0334 12/01/14 1315 12/02/14 0543 12/02/14 0843  HGB  --   --  12.9*  --   --  14.3  --   HCT  --   --  39.7  --   --  43.9  --   PLT  --   --  171  --   --  196  --   LABPROT  --   < > 29.4* 23.9* 21.1* 18.0*  --   INR  --   < > 2.76* 2.16* 1.83* 1.49  --   HEPARINUNFRC  --   --   --   --   --   --  0.37  CREATININE  --   --  1.01 1.08  --  1.14  --   TROPONINI 0.04*  --   --   --   --   --   --   < > = values in this interval not displayed.  Estimated Creatinine Clearance: 58.7 mL/min (by C-G formula based on Cr of 1.14).  Assessment: 80yom on coumadin pta for hx DVT/TIA now being transitioned to heparin pending cardiac cath. Pt got cath last PM and heparin was resumed. Level is therapeutic this AM. MD ok to resume coumadin. Give first dose now then home on home dose.   Goal of Therapy:  Heparin level 0.3-0.7 units/ml Monitor platelets by anticoagulation protocol: Yes   Plan:   Cont heparin at 1200 units/hr Check 6 hr confirm level Coumadin 7.5mg  PO now then resume home dose at [employer], PharmD Pager: [phone] 12/02/2014 10:27 AM

## 2014-12-04 ENCOUNTER — Encounter (HOSPITAL_COMMUNITY): Payer: Self-pay | Admitting: Interventional Cardiology

## 2014-12-04 DIAGNOSIS — Z7901 Long term (current) use of anticoagulants: Secondary | ICD-10-CM | POA: Diagnosis not present

## 2014-12-04 MED FILL — Lidocaine HCl Local Preservative Free (PF) Inj 1%: INTRAMUSCULAR | Qty: 30 | Status: AC

## 2014-12-04 MED FILL — Heparin Sodium (Porcine) 2 Unit/ML in Sodium Chloride 0.9%: INTRAMUSCULAR | Qty: 1500 | Status: AC

## 2014-12-08 DIAGNOSIS — I82403 Acute embolism and thrombosis of unspecified deep veins of lower extremity, bilateral: Secondary | ICD-10-CM | POA: Diagnosis not present

## 2014-12-08 DIAGNOSIS — I5021 Acute systolic (congestive) heart failure: Secondary | ICD-10-CM | POA: Diagnosis not present

## 2014-12-08 DIAGNOSIS — Z7901 Long term (current) use of anticoagulants: Secondary | ICD-10-CM | POA: Diagnosis not present

## 2014-12-12 DIAGNOSIS — Z7901 Long term (current) use of anticoagulants: Secondary | ICD-10-CM | POA: Diagnosis not present

## 2014-12-12 DIAGNOSIS — I82403 Acute embolism and thrombosis of unspecified deep veins of lower extremity, bilateral: Secondary | ICD-10-CM | POA: Diagnosis not present

## 2014-12-18 ENCOUNTER — Other Ambulatory Visit: Payer: Self-pay | Admitting: *Deleted

## 2014-12-18 ENCOUNTER — Other Ambulatory Visit: Payer: Medicare Other

## 2014-12-18 ENCOUNTER — Ambulatory Visit (INDEPENDENT_AMBULATORY_CARE_PROVIDER_SITE_OTHER): Payer: Medicare Other | Admitting: Nurse Practitioner

## 2014-12-18 ENCOUNTER — Encounter: Payer: Self-pay | Admitting: Nurse Practitioner

## 2014-12-18 VITALS — BP 110/80 | HR 65 | Ht 67.0 in | Wt 202.6 lb

## 2014-12-18 DIAGNOSIS — R300 Dysuria: Secondary | ICD-10-CM

## 2014-12-18 DIAGNOSIS — Z9889 Other specified postprocedural states: Secondary | ICD-10-CM | POA: Diagnosis not present

## 2014-12-18 DIAGNOSIS — I5041 Acute combined systolic (congestive) and diastolic (congestive) heart failure: Secondary | ICD-10-CM | POA: Diagnosis not present

## 2014-12-18 LAB — URINALYSIS, ROUTINE W REFLEX MICROSCOPIC
Bilirubin Urine: NEGATIVE
Ketones, ur: NEGATIVE
Nitrite: NEGATIVE
Specific Gravity, Urine: 1.02 (ref 1.000–1.030)
Total Protein, Urine: NEGATIVE
Urine Glucose: NEGATIVE
Urobilinogen, UA: 0.2 (ref 0.0–1.0)
pH: 6 (ref 5.0–8.0)

## 2014-12-18 LAB — BASIC METABOLIC PANEL
BUN: 25 mg/dL — ABNORMAL HIGH (ref 6–23)
CO2: 26 mEq/L (ref 19–32)
Calcium: 9.5 mg/dL (ref 8.4–10.5)
Chloride: 100 mEq/L (ref 96–112)
Creatinine, Ser: 1.18 mg/dL (ref 0.40–1.50)
GFR: 63.44 mL/min (ref >60.00)
Glucose, Bld: 112 mg/dL — ABNORMAL HIGH (ref 70–99)
Potassium: 3.9 mEq/L (ref 3.5–5.1)
Sodium: 133 mEq/L — ABNORMAL LOW (ref 135–145)

## 2014-12-18 LAB — BRAIN NATRIURETIC PEPTIDE: Pro B Natriuretic peptide (BNP): 413 pg/mL — ABNORMAL HIGH (ref 0.0–100.0)

## 2014-12-18 MED ORDER — LISINOPRIL 2.5 MG PO TABS: 2.5000 mg | ORAL_TABLET | Freq: Every day | ORAL | Status: DC

## 2014-12-18 MED ORDER — FUROSEMIDE 20 MG PO TABS: 20.0000 mg | ORAL_TABLET | Freq: Every day | ORAL | Status: DC

## 2014-12-18 MED ORDER — CIPROFLOXACIN HCL 500 MG PO TABS: 500.0000 mg | ORAL_TABLET | Freq: Two times a day (BID) | ORAL | Status: DC

## 2014-12-18 MED ORDER — METOPROLOL SUCCINATE ER 25 MG PO TB24: 12.5000 mg | ORAL_TABLET | Freq: Every day | ORAL | Status: DC

## 2014-12-18 NOTE — Patient Instructions (Addendum)
We will be checking the following labs today - BMET, BNP and UA   Medication Instructions:    Continue with your current medicines but I am cutting the Lasix back to just one a day  Use extra lasix if your weight goes up 2/3 pounds or more overnight  I have refilled the Lasix, Metoprolol and Lisinopril today.     Testing/Procedures To Be Arranged:  N/A  Follow-Up:   See Dr. Acie Fredrickson or me in 2 weeks.     Other Special Instructions:   Restrict salt   Ok to increase your activity as tolerated  Watch the heat  Call the [place] office at [phone] if you have any questions, problems or concerns.

## 2014-12-18 NOTE — Progress Notes (Addendum)
CARDIOLOGY OFFICE NOTE  Date:  12/18/2014    Michael Randolph Date of Birth: 22-Aug-1934 Medical Record #[id number]  PCP:  [name], MD  Cardiologist:  Nahser  Chief Complaint  Patient presents with  . Post hospital for CHF/post cath    Seen for Dr. Acie Fredrickson    History of Present Illness: Michael Randolph is a 80 y.o. male who presents today for a post hospital visit. Seen for Dr. Acie Fredrickson. He has DM, prior DVT on Coumadin, prior TIA.  Presented several weeks ago with dyspnea, chest pain and productive cough. Found to have combined systolic and diastolic HF. EF is 35 to 40%. Diuresed and started on beta blocker and ACE. Also with VT - had cardiac cath showing mild nonobstructive CAD with severe LV dysfunction - EF 20 to 25% and moderate pulmonary HTN. Patient is reluctant to use statin due to history of myalgias and rash.   Comes in today. Here with his wife.   Lab from 5/27 with BUN of 29 and creatinine and 1.2 from Dr. Virgina Jock.   Past Medical History  Diagnosis Date  . DVT (deep venous thrombosis)   . Diabetes mellitus without complication     no medications  . TIA (transient ischemic attack) 2008    was seen on CT     Past Surgical History  Procedure Laterality Date  . Rotator cuff repair Right   . Cardiac catheterization N/A 12/01/2014    Procedure: Right/Left Heart Cath and Coronary Angiography;  Surgeon: Jettie Booze, MD;  Location: [facility];  Service: Cardiovascular;  Laterality: N/A;     Medications: Current Outpatient Prescriptions  Medication Sig Dispense Refill  . aspirin 81 MG tablet Take 81 mg by mouth daily.    . Cholecalciferol (VITAMIN D-3) 1000 UNITS CAPS Take 2 capsules by mouth daily.    . furosemide (LASIX) 20 MG tablet Take 1 tablet (20 mg total) by mouth daily. May use extra prn weight gain 60 tablet 6  . GARLIC PO Take 1 tablet by mouth daily.    [place] Kitchen lisinopril (ZESTRIL) 2.5 MG tablet Take 1 tablet (2.5 mg total) by mouth  daily. 30 tablet 6  . metoprolol succinate (TOPROL-XL) 25 MG 24 hr tablet Take 0.5 tablets (12.5 mg total) by mouth daily. 30 tablet 6  . warfarin (COUMADIN) 5 MG tablet Take 2.5-5 mg by mouth daily. Take a whole tablet every day except on mondays and fridays take 2.5mg      No current facility-administered medications for this visit.    Allergies: Allergies  Allergen Reactions  . Actos [Pioglitazone] Other (See Comments)    fatigue  . Sulfa Antibiotics Other (See Comments)    hallucinations  . Dye Fdc Red [Red Dye] Rash    # 40    Social History: The patient  reports that he has never smoked. He does not have any smokeless tobacco history on file. He reports that he does not drink alcohol or use illicit drugs.   Family History: The patient's family history includes Crohn's disease in his brother; Heart attack in his father; Hypertension in his mother; Schizophrenia in his sister; Stroke in his mother.   Review of Systems: Please see the history of present illness.   Otherwise, the review of systems is positive for none.   All other systems are reviewed and negative.   Physical Exam: VS:  BP 110/80 mmHg  Pulse 65  Ht 5\' 7"  (1.702 m)  Wt 202  lb 9.6 oz (91.899 kg)  BMI 31.72 kg/m2  SpO2 95% .  BMI Body mass index is 31.72 kg/(m^2).  Wt Readings from Last 3 Encounters:  12/18/14 202 lb 9.6 oz (91.899 kg)  12/02/14 209 lb 7 oz (95 kg)    General: Pleasant. Well developed, well nourished and in no acute distress.  HEENT: Normal. Neck: Supple, no JVD, carotid bruits, or masses noted.  Cardiac: Regular rate and rhythm. No murmurs, rubs, or gallops. No edema.  Respiratory:  Lungs are clear to auscultation bilaterally with normal work of breathing.  GI: Soft and nontender.  MS: No deformity or atrophy. Gait and ROM intact. Skin: Warm and dry. Color is normal.  Neuro:  Strength and sensation are intact and no gross focal deficits noted.  Psych: Alert, appropriate and with normal  affect.   LABORATORY DATA:  EKG:  EKG is not ordered today.   Lab Results  Component Value Date   WBC 7.8 12/02/2014   HGB 14.3 12/02/2014   HCT 43.9 12/02/2014   PLT 196 12/02/2014   GLUCOSE 109* 12/02/2014   CHOL 201* 11/29/2014   TRIG 120 11/29/2014   HDL 30* 11/29/2014   LDLCALC 147* 11/29/2014   NA 138 12/02/2014   K 3.9 12/02/2014   CL 101 12/02/2014   CREATININE 1.14 12/02/2014   BUN 17 12/02/2014   CO2 28 12/02/2014   TSH 4.347 11/29/2014   INR 1.49 12/02/2014   HGBA1C 6.1* 11/29/2014    BNP (last 3 results)  Recent Labs  11/28/14 1851  BNP 732.6*    ProBNP (last 3 results) No results for input(s): PROBNP in the last 8760 hours.   Other Studies Reviewed Today: Cardiac Cath Conclusion     Mild, nonobstructive coronary artery disease.  Severe left ventricular dysfunction with ejection fraction 20-25%. Nonischemic cardiomyopathy.  Moderate pulmonary hypertension.  Pulmonary artery saturation 67%. Cardiac output 5.78 L/m. Cardiac index 2.8.  Nonischemic cardiomyopathy with severe left ventricular dysfunction. Gentle hydration post catheterization for renal protection. Avoid excessive fluids given his decreased LV function. He'll need aggressive medical therapy for his cardiomyopathy.   Echo Study Conclusions from 11/2014  - Left ventricle: The cavity size was normal. Wall thickness was increased in a pattern of mild LVH. Systolic function was moderately reduced. The estimated ejection fraction was in the range of 35% to 40%. Doppler parameters are consistent with restrictive physiology, indicative of decreased left ventricular diastolic compliance and/or increased left atrial pressure. - Aortic valve: There was trivial regurgitation. Valve area (VTI): 1.45 cm^2. Valve area (Vmax): 1.71 cm^2. Valve area (Vmean): 1.6 cm^2. - Mitral valve: Calcified annulus. There was mild regurgitation. - Left atrium: The atrium was mildly  dilated.  Assessment/Plan: 1. Recent acute on chronic combined systolic and diastolic HF - EF 35 to 40 by echo and 20 to 25% by cath. Only on low dose medicines. BP 110. BUN has increased. I am cutting his Lasix back. [place] lab today. Hope to try and start titrating on return. He has improved clinically. Understands need for salt restriction, daily weights, etc.   2. Recent cath - nonobstructive disease and moderate pulmonary HTN noted as well.   3. HTN   4. HLD - with reluctance to take statins - will hold for now.   5. Dysuria - check UA and culture today.   Current medicines are reviewed with the patient today.  The patient does not have concerns regarding medicines other than what has been noted above.  The following  changes have been made:  See above.  Labs/ tests ordered today include:    Orders Placed This Encounter  Procedures  . Urine culture  . Urinalysis, Routine w reflex microscopic (not at [facility])  . Basic metabolic panel  . Brain natriuretic peptide     Disposition:   FU with me or Dr. Acie Fredrickson in 2 weeks.    Patient is agreeable to this plan and will call if any problems develop in the interim.   Signed: Burtis Junes, RN, ANP-C 12/18/2014 9:53 AM  [facility] [address] [place], [place]  [address] Phone: [phone] Fax: [phone]

## 2014-12-18 NOTE — Addendum Note (Signed)
Addended by: Burtis Junes on: 12/18/2014 09:53 AM   Modules accepted: Orders

## 2014-12-20 DIAGNOSIS — I82403 Acute embolism and thrombosis of unspecified deep veins of lower extremity, bilateral: Secondary | ICD-10-CM | POA: Diagnosis not present

## 2014-12-20 DIAGNOSIS — Z7901 Long term (current) use of anticoagulants: Secondary | ICD-10-CM | POA: Diagnosis not present

## 2014-12-21 LAB — URINE CULTURE: Colony Count: >=100000

## 2014-12-22 ENCOUNTER — Telehealth: Payer: Self-pay | Admitting: Nurse Practitioner

## 2014-12-22 NOTE — Telephone Encounter (Signed)
Follow -up       Returning RN call pt is wanting Blood work results.

## 2014-12-31 DIAGNOSIS — I82403 Acute embolism and thrombosis of unspecified deep veins of lower extremity, bilateral: Secondary | ICD-10-CM | POA: Diagnosis not present

## 2014-12-31 DIAGNOSIS — E785 Hyperlipidemia, unspecified: Secondary | ICD-10-CM | POA: Diagnosis not present

## 2014-12-31 DIAGNOSIS — Z6832 Body mass index (BMI) 32.0-32.9, adult: Secondary | ICD-10-CM | POA: Diagnosis not present

## 2014-12-31 DIAGNOSIS — Z7901 Long term (current) use of anticoagulants: Secondary | ICD-10-CM | POA: Diagnosis not present

## 2014-12-31 DIAGNOSIS — I509 Heart failure, unspecified: Secondary | ICD-10-CM | POA: Diagnosis not present

## 2014-12-31 DIAGNOSIS — I5021 Acute systolic (congestive) heart failure: Secondary | ICD-10-CM | POA: Diagnosis not present

## 2014-12-31 DIAGNOSIS — Z9189 Other specified personal risk factors, not elsewhere classified: Secondary | ICD-10-CM | POA: Diagnosis not present

## 2014-12-31 DIAGNOSIS — R0600 Dyspnea, unspecified: Secondary | ICD-10-CM | POA: Diagnosis not present

## 2014-12-31 DIAGNOSIS — I5041 Acute combined systolic (congestive) and diastolic (congestive) heart failure: Secondary | ICD-10-CM | POA: Diagnosis not present

## 2015-01-01 ENCOUNTER — Ambulatory Visit (INDEPENDENT_AMBULATORY_CARE_PROVIDER_SITE_OTHER): Payer: Medicare Other | Admitting: Nurse Practitioner

## 2015-01-01 ENCOUNTER — Encounter: Payer: Self-pay | Admitting: Nurse Practitioner

## 2015-01-01 VITALS — BP 110/70 | HR 62 | Resp 18 | Ht 67.0 in | Wt 204.0 lb

## 2015-01-01 DIAGNOSIS — I5041 Acute combined systolic (congestive) and diastolic (congestive) heart failure: Secondary | ICD-10-CM

## 2015-01-01 DIAGNOSIS — E785 Hyperlipidemia, unspecified: Secondary | ICD-10-CM | POA: Diagnosis not present

## 2015-01-01 DIAGNOSIS — R300 Dysuria: Secondary | ICD-10-CM | POA: Diagnosis not present

## 2015-01-01 DIAGNOSIS — I10 Essential (primary) hypertension: Secondary | ICD-10-CM | POA: Diagnosis not present

## 2015-01-01 LAB — BASIC METABOLIC PANEL
BUN: 23 mg/dL (ref 6–23)
CO2: 28 mEq/L (ref 19–32)
Calcium: 9.3 mg/dL (ref 8.4–10.5)
Chloride: 101 mEq/L (ref 96–112)
Creatinine, Ser: 1.15 mg/dL (ref 0.40–1.50)
GFR: 65.34 mL/min (ref >60.00)
Glucose, Bld: 94 mg/dL (ref 70–99)
Potassium: 4.2 mEq/L (ref 3.5–5.1)
Sodium: 133 mEq/L — ABNORMAL LOW (ref 135–145)

## 2015-01-01 MED ORDER — LISINOPRIL 2.5 MG PO TABS: 2.5000 mg | ORAL_TABLET | Freq: Two times a day (BID) | ORAL | Status: DC

## 2015-01-01 MED ORDER — FUROSEMIDE 20 MG PO TABS: 20.0000 mg | ORAL_TABLET | Freq: Every day | ORAL | Status: DC | PRN

## 2015-01-01 NOTE — Patient Instructions (Addendum)
We will be checking the following labs today - BMET & urinalysis   Medication Instructions:    Continue with your current medicines but I am   Changing Lasix to use only if needed  Increasing Lisinopril to take twice a day - I have given you a paper RX    Testing/Procedures To Be Arranged:  Echocardiogram after August 18th - go ahead and arrange  Follow-Up:   I will see you back in 3 weeks  See Dr. Acie Fredrickson after echo in August 18th     Other Special Instructions:   N/A  Call the [place] office at [phone] if you have any questions, problems or concerns.

## 2015-01-01 NOTE — Progress Notes (Signed)
CARDIOLOGY OFFICE NOTE  Date:  01/01/2015    Michael Randolph Date of Birth: 1935-01-10 Medical Record #[id number]  PCP:  Michael Reel, MD  Cardiologist:  Michael Randolph    Chief Complaint  Patient presents with  . Congestive Heart Failure    2 week check - seen for Michael Randolph    History of Present Illness: Michael Randolph is a 80 y.o. male who presents today for a 2 week check. Seen for Michael Randolph. He has DM, prior DVT on Coumadin, prior TIA. He has HLD and he is reluctant to use statin due to history of myalgias and rash.  Presented several weeks ago with dyspnea, chest pain and productive cough. Found to have combined systolic and diastolic HF. EF is 35 to 40%. Diuresed and started on beta blocker and ACE. Also with VT - had cardiac cath showing mild nonobstructive CAD with severe LV dysfunction - EF 20 to 25% and moderate pulmonary HTN.    I saw him 2 weeks ago - he was improved clinically. BP still soft and not able to titrate medicines. Weight was down. Lasix was cut back - had had some mild renal insufficiency. Had a UTI - I treated this.   Comes back today. Here with his wife. Doing well. No extra diuretic. Weight is stable. Breathing is good. BP is about what it is here today at home. 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 Not dizzy or lightheaded. Really restricting his salt. They both have done such a good job that his wife's BP has come down. Has finished his antibiotic - still with a little burning up until this morning.   Past Medical History  Diagnosis Date  . DVT (deep venous thrombosis)   . Diabetes mellitus without complication     no medications  . TIA (transient ischemic attack) 2008    was seen on CT     Past Surgical History  Procedure Laterality Date  . Rotator cuff repair Right   . Cardiac catheterization N/A 12/01/2014    Procedure: Right/Left Heart Cath and Coronary Angiography;  Surgeon: Michael Booze, MD;  Location: [facility];  Service: Cardiovascular;  Laterality: N/A;     Medications: Current Outpatient Prescriptions  Medication Sig Dispense Refill  . aspirin 81 MG tablet Take 81 mg by mouth daily.    . Cholecalciferol (VITAMIN D-3) 1000 UNITS CAPS Take 2 capsules by mouth daily.    . furosemide (LASIX) 20 MG tablet Take 1 tablet (20 mg total) by mouth daily as needed for fluid or edema. May use extra prn weight gain 60 tablet 6  . GARLIC PO Take 1 tablet by mouth daily.    Marland Kitchen lisinopril (ZESTRIL) 2.5 MG tablet Take 1 tablet (2.5 mg total) by mouth 2 (two) times daily. 60 tablet 6  . metoprolol succinate (TOPROL-XL) 25 MG 24 hr tablet Take 0.5 tablets (12.5 mg total) by mouth daily. 30 tablet 6  . warfarin (COUMADIN) 5 MG tablet Take 2.5-5 mg by mouth daily. Take a whole tablet every day except on mondays and fridays take 2.5mg      No current facility-administered medications for this visit.    Allergies: Allergies  Allergen Reactions  . Actos [Pioglitazone] Other (See Comments)    fatigue  . Sulfa Antibiotics Other (See Comments)    hallucinations  . Dye Fdc Red [Red Dye] Rash    # 40    Social History: The  patient  reports that he has never smoked. He does not have any smokeless tobacco history on file. He reports that he does not drink alcohol or use illicit drugs.   Family History: The patient's family history includes Crohn's disease in his brother; Heart attack in his father; Hypertension in his mother; Schizophrenia in his sister; Stroke in his mother.   Review of Systems: Please see the history of present illness.   Otherwise, the review of systems is positive for none.   All other systems are reviewed and negative.   Physical Exam: VS:  BP 110/70 mmHg  Pulse 62  Resp 18  Ht 5\' 7"  (1.702 m)  Wt 204 lb (92.534 kg)  BMI 31.94 kg/m2  SpO2 97% .  BMI Body mass index is 31.94 kg/(m^2).  Wt Readings from Last 3 Encounters:  01/01/15 204 lb (92.534 kg)  12/18/14 202 lb  9.6 oz (91.899 kg)  12/02/14 209 lb 7 oz (95 kg)    General: Pleasant. Well developed, well nourished and in no acute distress.  HEENT: Normal. Neck: Supple, no JVD, carotid bruits, or masses noted.  Cardiac: Regular rate and rhythm. No murmurs, rubs, or gallops. No edema.  Respiratory:  Lungs are clear to auscultation bilaterally with normal work of breathing.  GI: Soft and nontender.  MS: No deformity or atrophy. Gait and ROM intact. Skin: Warm and dry. Color is normal.  Neuro:  Strength and sensation are intact and no gross focal deficits noted.  Psych: Alert, appropriate and with normal affect.   LABORATORY DATA:  EKG:  EKG is not ordered today.   Lab Results  Component Value Date   WBC 7.8 12/02/2014   HGB 14.3 12/02/2014   HCT 43.9 12/02/2014   PLT 196 12/02/2014   GLUCOSE 112* 12/18/2014   CHOL 201* 11/29/2014   TRIG 120 11/29/2014   HDL 30* 11/29/2014   LDLCALC 147* 11/29/2014   NA 133* 12/18/2014   K 3.9 12/18/2014   CL 100 12/18/2014   CREATININE 1.18 12/18/2014   BUN 25* 12/18/2014   CO2 26 12/18/2014   TSH 4.347 11/29/2014   INR 1.49 12/02/2014   HGBA1C 6.1* 11/29/2014    BNP (last 3 results)  Recent Labs  11/28/14 1851  BNP 732.6*    ProBNP (last 3 results)  Recent Labs  12/18/14 0948  PROBNP 413.0*     Other Studies Reviewed Today:  Cardiac Cath Conclusion     Mild, nonobstructive coronary artery disease.  Severe left ventricular dysfunction with ejection fraction 20-25%. Nonischemic cardiomyopathy.  Moderate pulmonary hypertension.  Pulmonary artery saturation 67%. Cardiac output 5.78 L/m. Cardiac index 2.8.  Nonischemic cardiomyopathy with severe left ventricular dysfunction. Gentle hydration post catheterization for renal protection. Avoid excessive fluids given his decreased LV function. He'll need aggressive medical therapy for his cardiomyopathy.   Echo Study Conclusions from 11/2014  - Left ventricle: The cavity size  was normal. Wall thickness was increased in a pattern of mild LVH. Systolic function was moderately reduced. The estimated ejection fraction was in the range of 35% to 40%. Doppler parameters are consistent with restrictive physiology, indicative of decreased left ventricular diastolic compliance and/or increased left atrial pressure. - Aortic valve: There was trivial regurgitation. Valve area (VTI): 1.45 cm^2. Valve area (Vmax): 1.71 cm^2. Valve area (Vmean): 1.6 cm^2. - Mitral valve: Calcified annulus. There was mild regurgitation. - Left atrium: The atrium was mildly dilated.  Assessment/Plan: 1. Recent acute on chronic combined systolic and diastolic HF - EF  35 to 40 by echo and 20 to 25% by cath. Only on low dose medicines. BP 110. He is doing well. Will try changing his Lasix to just prn. Increase ACE - he will continue to monitor his BP at home and limit salt.  Understands need for salt restriction, daily weights, etc. Will get echo arranged for after 8/18 and follow up with me in 3 weeks and then Michael Randolph after echo.   2. Recent cath - nonobstructive disease and moderate pulmonary HTN noted as well.   3. HTN   4. HLD - with reluctance to take statins - will hold for now.   5. Prior TIA - remains on coumadin anticoagulation  6. Recent UTI - would like a recheck on his urine         Current medicines are reviewed with the patient today.  The patient does not have concerns regarding medicines other than what has been noted above.  The following changes have been made:  See above.  Labs/ tests ordered today include:    Orders Placed This Encounter  Procedures  . Basic metabolic panel  . Urinalysis     Disposition:   FU with me in 3 weeks.   Patient is agreeable to this plan and will call if any problems develop in the interim.   Signed: Burtis Junes, RN, ANP-C 01/01/2015 11:14 AM  [facility] [address]  [place], [place]  [address] Phone: [phone] Fax: [phone]

## 2015-01-02 LAB — URINALYSIS
Bilirubin Urine: NEGATIVE
Hgb urine dipstick: NEGATIVE
Ketones, ur: NEGATIVE
Leukocytes, UA: NEGATIVE
Nitrite: NEGATIVE
Specific Gravity, Urine: 1.01 (ref 1.000–1.030)
Total Protein, Urine: NEGATIVE
Urine Glucose: NEGATIVE
Urobilinogen, UA: 0.2 (ref 0.0–1.0)
pH: 6 (ref 5.0–8.0)

## 2015-01-03 DIAGNOSIS — Z7901 Long term (current) use of anticoagulants: Secondary | ICD-10-CM | POA: Diagnosis not present

## 2015-01-03 DIAGNOSIS — I82403 Acute embolism and thrombosis of unspecified deep veins of lower extremity, bilateral: Secondary | ICD-10-CM | POA: Diagnosis not present

## 2015-01-17 ENCOUNTER — Encounter: Payer: Self-pay | Admitting: Nurse Practitioner

## 2015-01-17 ENCOUNTER — Ambulatory Visit (INDEPENDENT_AMBULATORY_CARE_PROVIDER_SITE_OTHER): Payer: Medicare Other | Admitting: Nurse Practitioner

## 2015-01-17 VITALS — BP 128/74 | HR 63 | Ht 67.0 in | Wt 202.8 lb

## 2015-01-17 DIAGNOSIS — I5042 Chronic combined systolic (congestive) and diastolic (congestive) heart failure: Secondary | ICD-10-CM

## 2015-01-17 DIAGNOSIS — E785 Hyperlipidemia, unspecified: Secondary | ICD-10-CM

## 2015-01-17 LAB — BASIC METABOLIC PANEL
BUN: 20 mg/dL (ref 6–23)
CO2: 27 mEq/L (ref 19–32)
Calcium: 9.6 mg/dL (ref 8.4–10.5)
Chloride: 105 mEq/L (ref 96–112)
Creatinine, Ser: 1.02 mg/dL (ref 0.40–1.50)
GFR: 75.04 mL/min (ref >60.00)
Glucose, Bld: 82 mg/dL (ref 70–99)
Potassium: 4.1 mEq/L (ref 3.5–5.1)
Sodium: 139 mEq/L (ref 135–145)

## 2015-01-17 NOTE — Progress Notes (Signed)
CARDIOLOGY OFFICE NOTE  Date:  01/17/2015    Michael Randolph Date of Birth: 07/13/1935 Medical Record #[id number]  PCP:  [name], MD  Cardiologist:  Nahser    Chief Complaint  Patient presents with  . Congestive Heart Failure    3 week check - seen for Dr. Acie Fredrickson.     History of Present Illness: Michael Randolph is a 80 y.o. male who presents today for a follow up visit. This is a 3 week check. Seen for Dr. Acie Fredrickson. He has DM, prior DVT on Coumadin, prior TIA. He has HLD and he is reluctant to use statin due to history of myalgias and rash.  Presented back in May with dyspnea, chest pain and productive cough. Found to have combined systolic and diastolic HF. EF is 35 to 40%. Diuresed and started on beta blocker and ACE. Also with VT - had cardiac cath showing mild nonobstructive CAD with severe LV dysfunction - EF 20 to 25% and moderate pulmonary HTN.   I have seen him twice since discharge. He was improved clinically. Had a UTI which I treated. Have changed his Lasix to just prn and tried to increase ACE. BP is limiting how much titration we can do.   Comes back today. Here with his wife. Doing ok. BP cuff correlates. BP readings at home much lower - in the 46'K systolic. Breathing is good. No swelling. Weight is stable. Fleeting episode of dizziness. Going to [facility] to get a sleep work up.   Past Medical History  Diagnosis Date  . DVT (deep venous thrombosis)   . Diabetes mellitus without complication     no medications  . TIA (transient ischemic attack) 2008    was seen on CT     Past Surgical History  Procedure Laterality Date  . Rotator cuff repair Right   . Cardiac catheterization N/A 12/01/2014    Procedure: Right/Left Heart Cath and Coronary Angiography;  Surgeon: Jettie Booze, MD;  Location: [facility];  Service: Cardiovascular;  Laterality: N/A;     Medications: Current Outpatient Prescriptions  Medication Sig Dispense Refill  .  aspirin 81 MG tablet Take 81 mg by mouth daily.    . Cholecalciferol (VITAMIN D-3) 1000 UNITS CAPS Take 2 capsules by mouth daily.    . furosemide (LASIX) 20 MG tablet Take 1 tablet (20 mg total) by mouth daily as needed for fluid or edema. May use extra prn weight gain 60 tablet 6  . GARLIC PO Take 1 tablet by mouth daily.    Marland Kitchen lisinopril (ZESTRIL) 2.5 MG tablet Take 1 tablet (2.5 mg total) by mouth 2 (two) times daily. 60 tablet 6  . metoprolol succinate (TOPROL-XL) 25 MG 24 hr tablet Take 0.5 tablets (12.5 mg total) by mouth daily. 30 tablet 6  . warfarin (COUMADIN) 5 MG tablet Take 2.5-5 mg by mouth daily. Take a whole tablet every day except on mondays and fridays take 2.5mg      No current facility-administered medications for this visit.    Allergies: Allergies  Allergen Reactions  . Actos [Pioglitazone] Other (See Comments)    fatigue  . Sulfa Antibiotics Other (See Comments)    hallucinations  . Dye Fdc Red [Red Dye] Rash    # 40    Social History: The patient  reports that he has never smoked. He does not have any smokeless tobacco history on file. He reports that he does not drink alcohol or use  illicit drugs.   Family History: The patient's family history includes Crohn's disease in his brother; Heart attack in his father; Hypertension in his mother; Schizophrenia in his sister; Stroke in his mother.   Review of Systems: Please see the history of present illness.   Otherwise, the review of systems is positive for none.   All other systems are reviewed and negative.   Physical Exam: VS:  BP 128/74 mmHg  Pulse 63  Ht 5\' 7"  (1.702 m)  Wt 202 lb 12.8 oz (91.989 kg)  BMI 31.76 kg/m2  SpO2 93% .  BMI Body mass index is 31.76 kg/(m^2).  Wt Readings from Last 3 Encounters:  01/17/15 202 lb 12.8 oz (91.989 kg)  01/01/15 204 lb (92.534 kg)  12/18/14 202 lb 9.6 oz (91.899 kg)    General: Pleasant. Well developed, well nourished and in no acute distress.  HEENT:  Normal. Neck: Supple, no JVD, carotid bruits, or masses noted.  Cardiac: Regular rate and rhythm. No murmurs, rubs, or gallops. No edema.  Respiratory:  Lungs are clear to auscultation bilaterally with normal work of breathing.  GI: Soft and nontender.  MS: No deformity or atrophy. Gait and ROM intact. Skin: Warm and dry. Color is normal.  Neuro:  Strength and sensation are intact and no gross focal deficits noted.  Psych: Alert, appropriate and with normal affect.   LABORATORY DATA:  EKG:  EKG is not ordered today.   Lab Results  Component Value Date   WBC 7.8 12/02/2014   HGB 14.3 12/02/2014   HCT 43.9 12/02/2014   PLT 196 12/02/2014   GLUCOSE 94 01/01/2015   CHOL 201* 11/29/2014   TRIG 120 11/29/2014   HDL 30* 11/29/2014   LDLCALC 147* 11/29/2014   NA 133* 01/01/2015   K 4.2 01/01/2015   CL 101 01/01/2015   CREATININE 1.15 01/01/2015   BUN 23 01/01/2015   CO2 28 01/01/2015   TSH 4.347 11/29/2014   INR 1.49 12/02/2014   HGBA1C 6.1* 11/29/2014    BNP (last 3 results)  Recent Labs  11/28/14 1851  BNP 732.6*    ProBNP (last 3 results)  Recent Labs  12/18/14 0948  PROBNP 413.0*     Other Studies Reviewed Today: Cardiac Cath Conclusion     Mild, nonobstructive coronary artery disease.  Severe left ventricular dysfunction with ejection fraction 20-25%. Nonischemic cardiomyopathy.  Moderate pulmonary hypertension.  Pulmonary artery saturation 67%. Cardiac output 5.78 L/m. Cardiac index 2.8.  Nonischemic cardiomyopathy with severe left ventricular dysfunction. Gentle hydration post catheterization for renal protection. Avoid excessive fluids given his decreased LV function. He'll need aggressive medical therapy for his cardiomyopathy.   Echo Study Conclusions from 11/2014  - Left ventricle: The cavity size was normal. Wall thickness was increased in a pattern of mild LVH. Systolic function was moderately reduced. The estimated ejection  fraction was in the range of 35% to 40%. Doppler parameters are consistent with restrictive physiology, indicative of decreased left ventricular diastolic compliance and/or increased left atrial pressure. - Aortic valve: There was trivial regurgitation. Valve area (VTI): 1.45 cm^2. Valve area (Vmax): 1.71 cm^2. Valve area (Vmean): 1.6 cm^2. - Mitral valve: Calcified annulus. There was mild regurgitation. - Left atrium: The atrium was mildly dilated.  Assessment/Plan: 1. Recent acute on chronic combined systolic and diastolic HF - EF 35 to 40 by echo and 20 to 25% by cath. Understands need for salt restriction, daily weights, etc. Echo is arranged for after 8/18 and then see Dr. Acie Fredrickson  after echo.   2. Recent cath - nonobstructive disease and moderate pulmonary HTN noted as well.   3. HTN   4. HLD - with reluctance to take statins - will hold for now.   5. Prior TIA - remains on coumadin anticoagulation               Current medicines are reviewed with the patient today.  The patient does not have concerns regarding medicines other than what has been noted above.  The following changes have been made:  See above.  Labs/ tests ordered today include:    Orders Placed This Encounter  Procedures  . Basic metabolic panel     Disposition:   FU with Dr. Acie Fredrickson as planned after echo.   Patient is agreeable to this plan and will call if any problems develop in the interim.   Signed: Burtis Junes, RN, ANP-C 01/17/2015 10:29 AM  [facility] [address] [place], [place]  [address] Phone: [phone] Fax: [phone]

## 2015-01-17 NOTE — Patient Instructions (Addendum)
We will be checking the following labs today - BMET  Medication Instructions:    Continue with your current medicines.     Testing/Procedures To Be Arranged:  N/A  Follow-Up:   See Dr. Acie Fredrickson as planned after echo next month.    Other Special Instructions:   N/A  Call the [facility] office at [phone] if you have any questions, problems or concerns.

## 2015-02-08 DIAGNOSIS — Z7901 Long term (current) use of anticoagulants: Secondary | ICD-10-CM | POA: Diagnosis not present

## 2015-02-08 DIAGNOSIS — I82403 Acute embolism and thrombosis of unspecified deep veins of lower extremity, bilateral: Secondary | ICD-10-CM | POA: Diagnosis not present

## 2015-02-12 ENCOUNTER — Ambulatory Visit (INDEPENDENT_AMBULATORY_CARE_PROVIDER_SITE_OTHER): Payer: Medicare Other | Admitting: Neurology

## 2015-02-12 ENCOUNTER — Encounter: Payer: Self-pay | Admitting: Neurology

## 2015-02-12 VITALS — BP 110/58 | HR 58 | Resp 16 | Ht 67.0 in | Wt 203.0 lb

## 2015-02-12 DIAGNOSIS — E669 Obesity, unspecified: Secondary | ICD-10-CM

## 2015-02-12 DIAGNOSIS — I5042 Chronic combined systolic (congestive) and diastolic (congestive) heart failure: Secondary | ICD-10-CM | POA: Diagnosis not present

## 2015-02-12 DIAGNOSIS — G4733 Obstructive sleep apnea (adult) (pediatric): Secondary | ICD-10-CM

## 2015-02-12 NOTE — Progress Notes (Signed)
Subjective:    Patient ID: Michael Randolph is a 80 y.o. male.  HPI      Star Age, MD, PhD [facility] [address], [address] [place], [place] [address]  Dear Dr. Virgina Jock,   I saw your patient, Michael Randolph, upon your kind request in my neurologic clinic today for initial consultation of his sleep disorder, in particular, concern for underlying obstructive sleep apnea. The patient is accompanied by his wife Leta today. As you know, [name] is a 80 year old right-handed gentleman with an underlying complex medical history of DVT, TIA, hyperlipidemia, type 2 diabetes, coronary artery disease, congestive heart failure, diverticulosis, kidney stone, allergic rhinitis, BPH, and obesity, who reports snoring, witnessed apneas, and non restorative sleep. His ESS is 4/24 today and his FSS is 12/63 today. He reports occasional morning headaches and has nocturia 1 times a night.  He was hospitalized in May from 11/28/14 to 12/02/14 for CHF. I reviewed the hospital records, including the discharged summary. His EF was 35-40%. He had the flu in November 2015. He snores only mildly per wife and she has noted apneic pauses in his sleep.  He typically goes to bed between 10:30 and 11:30 PM. He falls asleep within 20 or 30 minutes. He does not take a sleep aid. His rise time is between 6:50 and 7:10 AM. He does not typically wake up well rested. He sometimes takes a nap. He denies any restless leg symptoms or twitching in his sleep. He has a family history of obstructive sleep apnea in his son. He has never had a sleep study. He drinks caffeine in the form of coffee in the morning and drinks decaf tea at lunch. He does not drink alcohol.  His Past Medical History Is Significant For: Past Medical History  Diagnosis Date  . DVT (deep venous thrombosis)   . Diabetes mellitus without complication     no medications  . TIA (transient ischemic attack) 2008    was seen on CT    . Congestive heart failure (CHF)   . Stroke   . Dyspnea   . Diverticulosis   . Kidney stone   . Dysuria   . Hyperlipemia   . Heart disease   . Toe infection     His Past Surgical History Is Significant For: Past Surgical History  Procedure Laterality Date  . Rotator cuff repair Right   . Cardiac catheterization N/A 12/01/2014    Procedure: Right/Left Heart Cath and Coronary Angiography;  Surgeon: Jettie Booze, MD;  Location: [facility];  Service: Cardiovascular;  Laterality: N/A;  . Colonoscopy  2013    His Family History Is Significant For: Family History  Problem Relation Age of Onset  . Stroke Mother   . Hypertension Mother   . Heart attack Father   . Deep vein thrombosis Father   . Crohn's disease Brother   . Schizophrenia Sister     His Social History Is Significant For: History   Social History  . Marital Status: Married    Spouse Name: N/A  . Number of Children: 3  . Years of Education: Associates   Occupational History  . Retired     Social History Main Topics  . Smoking status: Never Smoker   . Smokeless tobacco: Not on file  . Alcohol Use: No  . Drug Use: No  . Sexual Activity: Not on file   Other Topics Concern  . None   Social History  Narrative   Drinks 1-2 cups of coffee a day     His Allergies Are:  Allergies  Allergen Reactions  . Anesthetics, Halogenated   . Actos [Pioglitazone] Other (See Comments)    fatigue  . Crestor [Rosuvastatin Calcium]   . Sulfa Antibiotics Other (See Comments)    hallucinations  . Dye Fdc Red [Red Dye] Rash    # 40  :   His Current Medications Are:  Outpatient Encounter Prescriptions as of 02/12/2015  Medication Sig  . aspirin 81 MG tablet Take 81 mg by mouth daily.  . Cholecalciferol (VITAMIN D-3) 1000 UNITS CAPS Take 2 capsules by mouth daily.  . Cholecalciferol (VITAMIN D3) 1000 UNITS CAPS Take by mouth.  . furosemide (LASIX) 20 MG tablet Take 1 tablet (20 mg total) by mouth daily as  needed for fluid or edema. May use extra prn weight gain  . GARLIC PO Take 1 tablet by mouth daily.  Marland Kitchen lisinopril (ZESTRIL) 2.5 MG tablet Take 1 tablet (2.5 mg total) by mouth 2 (two) times daily.  . metoprolol succinate (TOPROL-XL) 25 MG 24 hr tablet Take 0.5 tablets (12.5 mg total) by mouth daily.  Marland Kitchen warfarin (COUMADIN) 5 MG tablet Take 2.5-5 mg by mouth daily. Take a whole tablet every day except on mondays and fridays take 2.5mg   . [DISCONTINUED] Calcium Carb-Cholecalciferol (CALCIUM 600 + D PO) Take by mouth.   No facility-administered encounter medications on file as of 02/12/2015.  :  Review of Systems:  Out of a complete 14 point review of systems, all are reviewed and negative with the exception of these symptoms as listed below:  Review of Systems  Neurological:       Wakes up around 3 am nightly, h/o snoring, witnessed apnea, wakes up feeling tired in the morning    Objective:  Neurologic Exam  Physical Exam Physical Examination:   Filed Vitals:   02/12/15 1450  BP: 110/58  Pulse: 58  Resp: 16   General Examination: The patient is a very pleasant 80 y.o. male in no acute distress. He appears well-developed and well-nourished and well groomed.   HEENT: Normocephalic, atraumatic, pupils are equal, round and reactive to light and accommodation. Funduscopic exam is normal with sharp disc margins noted. Extraocular tracking is good without limitation to gaze excursion or nystagmus noted. Normal smooth pursuit is noted. Hearing is grossly intact. Tympanic membranes are clear bilaterally. Face is symmetric with normal facial animation and normal facial sensation. Speech is clear with no dysarthria noted. There is no hypophonia. There is no lip, neck/head, jaw or voice tremor. Neck is supple with full range of passive and active motion. There are no carotid bruits on auscultation. Oropharynx exam reveals: mild mouth dryness, adequate dental hygiene and moderate airway crowding, due  to wider tongue, longer uvula and tonsils in place. Mallampati is class II. Tongue protrudes centrally and palate elevates symmetrically. Tonsils are 1+ on the right and smaller on the left. Neck size is 17 inches. He has a mild underbite. Nasal inspection reveals no significant nasal mucosal bogginess or redness and no septal deviation.   Chest: Clear to auscultation without wheezing, rhonchi or crackles noted.  Heart: S1+S2+0, regular and normal without murmurs, rubs or gallops noted.   Abdomen: Soft, non-tender and non-distended with normal bowel sounds appreciated on auscultation.  Extremities: There is trace pitting edema in the distal lower extremities bilaterally. Pedal pulses are intact.  Skin: Warm and dry without trophic changes noted. There are no varicose  veins.  Musculoskeletal: exam reveals no obvious joint deformities, tenderness or joint swelling or erythema.   Neurologically:  Mental status: The patient is awake, alert and oriented in all 4 spheres. His immediate and remote memory, attention, language skills and fund of knowledge are appropriate. There is no evidence of aphasia, agnosia, apraxia or anomia. Speech is clear with normal prosody and enunciation. Thought process is linear. Mood is normal and affect is normal.  Cranial nerves II - XII are as described above under HEENT exam. In addition: shoulder shrug is normal with equal shoulder height noted. Motor exam: Normal bulk, strength and tone is noted. There is no drift, tremor or rebound. Romberg is negative. Reflexes are 2+ throughout. Babinski: Toes are flexor bilaterally. Fine motor skills and coordination: intact with normal finger taps, normal hand movements, normal rapid alternating patting, normal foot taps and normal foot agility.  Cerebellar testing: No dysmetria or intention tremor on finger to nose testing. Heel to shin is unremarkable bilaterally. There is no truncal or gait ataxia.  Sensory exam: intact to  light touch, pinprick, vibration, temperature sense in the upper and lower extremities.  Gait, station and balance: He stands easily. No veering to one side is noted. No leaning to one side is noted. Posture is age-appropriate and stance is narrow based. Gait shows normal stride length and normal pace. No problems turning are noted. He turns en bloc. Tandem walk is slightly difficult for him.             Assessment and Plan:   In summary, TARIUS STANGELO is a very pleasant 80 y.o.-year old male with an underlying complex medical history of DVT, TIA, hyperlipidemia, type 2 diabetes, coronary artery disease, congestive heart failure, diverticulosis, kidney stone, allergic rhinitis, BPH, and obesity, who reports snoring, witnessed apneas, and non restorative sleep. His history and physical exam are concerning for obstructive sleep apnea (OSA). I had a long chat with the patient and his wife about my findings and the diagnosis of OSA, its prognosis and treatment options. We talked about medical treatments, surgical interventions and non-pharmacological approaches. I explained in particular the risks and ramifications of untreated moderate to severe OSA, especially with respect to developing cardiovascular disease down the Road, including congestive heart failure, difficult to treat hypertension, cardiac arrhythmias, or stroke. Even type 2 diabetes has, in part, been linked to untreated OSA. Symptoms of untreated OSA include daytime sleepiness, memory problems, mood irritability and mood disorder such as depression and anxiety, lack of energy, as well as recurrent headaches, especially morning headaches. We talked about trying to maintain a healthy lifestyle in general, as well as the importance of weight control. I encouraged the patient to eat healthy, exercise daily and keep well hydrated, to keep a scheduled bedtime and wake time routine, to not skip any meals and eat healthy snacks in between meals. I advised  the patient not to drive when feeling sleepy. I recommended the following at this time: sleep study with potential positive airway pressure titration. (We will score hypopneas at 4% and split the sleep study into diagnostic and treatment portion, if the estimated. 2 hour AHI is >15/h).   I explained the sleep test procedure to the patient and also outlined possible surgical and non-surgical treatment options of OSA, including the use of a custom-made dental device (which would require a referral to a specialist dentist or oral surgeon), upper airway surgical options, such as pillar implants, radiofrequency surgery, tongue base surgery, and UPPP (which  would involve a referral to an ENT surgeon). Rarely, jaw surgery such as mandibular advancement may be considered.  I also explained the CPAP treatment option to the patient, who indicated that he would be willing to try CPAP if the need arises. I explained the importance of being compliant with PAP treatment, not only for insurance purposes but primarily to improve His symptoms, and for the patient's long term health benefit, including to reduce His cardiovascular risks. I answered all their questions today and the patient and his wife were in agreement. I would like to see him back after the sleep study is completed and encouraged them to call with any interim questions, concerns, problems or updates.   Thank you very much for allowing me to participate in the care of this nice patient. If I can be of any further assistance to you please do not hesitate to call me at [phone].  Sincerely,   Star Age, MD, PhD

## 2015-02-12 NOTE — Patient Instructions (Signed)

## 2015-02-27 ENCOUNTER — Ambulatory Visit (INDEPENDENT_AMBULATORY_CARE_PROVIDER_SITE_OTHER): Payer: Medicare Other | Admitting: Neurology

## 2015-02-27 DIAGNOSIS — R9431 Abnormal electrocardiogram [ECG] [EKG]: Secondary | ICD-10-CM

## 2015-02-27 DIAGNOSIS — G479 Sleep disorder, unspecified: Secondary | ICD-10-CM

## 2015-02-27 DIAGNOSIS — G4733 Obstructive sleep apnea (adult) (pediatric): Secondary | ICD-10-CM | POA: Diagnosis not present

## 2015-02-27 DIAGNOSIS — G4761 Periodic limb movement disorder: Secondary | ICD-10-CM

## 2015-02-27 NOTE — Sleep Study (Signed)
Please see the scanned sleep study interpretation located in the procedure tab in the chart view section.  

## 2015-03-02 ENCOUNTER — Ambulatory Visit (HOSPITAL_COMMUNITY): Payer: Medicare Other | Attending: Cardiology

## 2015-03-02 ENCOUNTER — Other Ambulatory Visit: Payer: Self-pay | Admitting: Nurse Practitioner

## 2015-03-02 ENCOUNTER — Other Ambulatory Visit: Payer: Self-pay

## 2015-03-02 DIAGNOSIS — I517 Cardiomegaly: Secondary | ICD-10-CM | POA: Insufficient documentation

## 2015-03-02 DIAGNOSIS — I77819 Aortic ectasia, unspecified site: Secondary | ICD-10-CM | POA: Insufficient documentation

## 2015-03-02 DIAGNOSIS — I5041 Acute combined systolic (congestive) and diastolic (congestive) heart failure: Secondary | ICD-10-CM

## 2015-03-02 DIAGNOSIS — E785 Hyperlipidemia, unspecified: Secondary | ICD-10-CM

## 2015-03-02 DIAGNOSIS — I10 Essential (primary) hypertension: Secondary | ICD-10-CM | POA: Insufficient documentation

## 2015-03-02 DIAGNOSIS — E119 Type 2 diabetes mellitus without complications: Secondary | ICD-10-CM | POA: Insufficient documentation

## 2015-03-02 DIAGNOSIS — I059 Rheumatic mitral valve disease, unspecified: Secondary | ICD-10-CM | POA: Diagnosis not present

## 2015-03-02 DIAGNOSIS — R300 Dysuria: Secondary | ICD-10-CM

## 2015-03-02 DIAGNOSIS — I429 Cardiomyopathy, unspecified: Secondary | ICD-10-CM | POA: Diagnosis present

## 2015-03-05 ENCOUNTER — Telehealth: Payer: Self-pay | Admitting: Nurse Practitioner

## 2015-03-05 ENCOUNTER — Telehealth: Payer: Self-pay | Admitting: Neurology

## 2015-03-05 DIAGNOSIS — G4733 Obstructive sleep apnea (adult) (pediatric): Secondary | ICD-10-CM

## 2015-03-05 NOTE — Progress Notes (Signed)
I have attempted to call Mr. Fenn to review his echo and discuss his concerns regarding his medicines. I do not feel the Lisinopril is the reason for the lower HR - rather it is most likely the metoprolol. He did not answer. I have left him a message recommending he stay on all his current medicines and discuss with Dr. Acie Fredrickson next week.   I will be happy to speak with him if he wishes.   Burtis Junes, RN, [facility] [address] [place], [place]  [address] [phone]

## 2015-03-05 NOTE — Telephone Encounter (Signed)
**Note De-Identified Michael Randolph Obfuscation** The pt has been given his Echo results. Please see Echo for details.

## 2015-03-05 NOTE — Telephone Encounter (Signed)
New message ° ° ° ° °Pt returning your call °

## 2015-03-05 NOTE — Telephone Encounter (Signed)
Patient seen on 02/12/15, Split study on 02/27/15, ins: Medicare and (secondary?).  Beverlee Nims:   Please call and notify patient that the recent sleep study confirmed the diagnosis of moderate OSA. He did well with CPAP during the study with significant improvement of the respiratory events. Therefore, I would like start the patient on CPAP therapy at home by prescribing a machine for home use. I placed the order in the chart. The patient will need a follow up appointment with me in 8 to 10 weeks post set up that has to be scheduled; please go ahead and schedule while you have the patient on the phone and make sure patient understands the importance of keeping this window for the FU appointment, as it is often an insurance requirement and failing to adhere to this may result in losing coverage for sleep apnea treatment.  Please re-enforce the importance of compliance with treatment and the need for [place] to monitor compliance data - again an insurance requirement and good feedback for the patient as far as how they are doing.  Also remind patient, that any upcoming CPAP machine or mask issues, should be first addressed with the DME company. Please ask if patient has a preference regarding DME company.  Please arrange for CPAP set up at home through a DME company of patient's choice - once you have spoken to the patient - and faxed/routed report to PCP and referring MD (if other than PCP), you can close this encounter, thanks,   Star Age, MD, PhD [facility]

## 2015-03-06 ENCOUNTER — Telehealth: Payer: Self-pay | Admitting: Nurse Practitioner

## 2015-03-06 NOTE — Telephone Encounter (Signed)
New message      Pt is returning Lori's call

## 2015-03-06 NOTE — Telephone Encounter (Signed)
Phone call today to discuss his recent concerns.  Explained that it was the metoprolol that is causing the slow HR. He was not symptomatic with the low HR.   HR has improved over the last 2 days but he is wanting to stop the Metoprolol altogether (on just 12.5 QD)  He will stop Metoprolol Monitor HR See Dr. Acie Fredrickson as planned next week.  Burtis Junes, RN, [facility] [address] [place], [place]  [address] [phone]

## 2015-03-09 NOTE — Telephone Encounter (Signed)
I spoke to patient and gave him results. I went into extended detail about the study. Patient is hesitant to start CPAP. I was able to convince him to make appt to talk about the study in more detail with Dr. Rexene Alberts. Appt is made for September. I will fax copy to PCP.

## 2015-03-12 ENCOUNTER — Encounter: Payer: Self-pay | Admitting: Cardiovascular Disease

## 2015-03-12 ENCOUNTER — Ambulatory Visit (INDEPENDENT_AMBULATORY_CARE_PROVIDER_SITE_OTHER): Payer: Medicare Other | Admitting: Cardiovascular Disease

## 2015-03-12 ENCOUNTER — Institutional Professional Consult (permissible substitution): Payer: Medicare Other | Admitting: Internal Medicine

## 2015-03-12 VITALS — BP 136/70 | HR 62 | Ht 67.0 in | Wt 204.0 lb

## 2015-03-12 DIAGNOSIS — I5022 Chronic systolic (congestive) heart failure: Secondary | ICD-10-CM

## 2015-03-12 DIAGNOSIS — I5043 Acute on chronic combined systolic (congestive) and diastolic (congestive) heart failure: Secondary | ICD-10-CM | POA: Insufficient documentation

## 2015-03-12 MED ORDER — LISINOPRIL 2.5 MG PO TABS
2.5000 mg | ORAL_TABLET | Freq: Every day | ORAL | Status: DC
Start: 2015-03-12 — End: 2015-05-23

## 2015-03-12 NOTE — Progress Notes (Signed)
CARDIOLOGY OFFICE NOTE  Date:  03/12/2015    [name] Date of Birth: 1934/11/06 Medical Record #[id number]  PCP:  [name], MD  Cardiologist:  Avriel Kandel    Chief Complaint  Patient presents with  . Congestive Heart Failure    History of Present Illness: Michael Randolph is a 80 y.o. male who presents today for a follow up visit. This is a 3 week check.  He has DM, prior DVT on Coumadin, prior TIA. He has HLD and he is reluctant to use statin due to history of myalgias and rash.  Presented back in May with dyspnea, chest pain and productive cough. Found to have combined systolic and diastolic HF. EF is 35 to 40%. Diuresed and started on beta blocker and ACE. Also with VT - had cardiac cath showing mild nonobstructive CAD with severe LV dysfunction - EF 20 to 25% and moderate pulmonary HTN.   I have seen him twice since discharge. He was improved clinically. Had a UTI which I treated. Have changed his Lasix to just prn and tried to increase ACE. BP is limiting how much titration we can do.   Comes back today. Here with his wife. Doing ok. BP cuff correlates. BP readings at home much lower - in the 88'C systolic. Breathing is good. No swelling. Weight is stable. Fleeting episode of dizziness. Going to [facility] to get a sleep work up.   March 12, 2015:   Michael Randolph was seen in the hospital in May. He has diastolic and systolic dysfunction. He had a cardiac catheterization which revealed minor coronary artery irregularities.  He was found to have a EF of 25-30% despite medical therapy.  It was recommended that he get an ICD/pacemaker. He's fairly adamant that he does not want a pacemaker at this time.  His heart rate and blood pressure had been quite low. He has discontinued the metoprolol and the lisinopril.  He's feeling quite a bit better than when I met him in the hospital in May. He's able to do all of his normal activities without severe chest pain or  shortness breath.   Past Medical History  Diagnosis Date  . DVT (deep venous thrombosis)   . Diabetes mellitus without complication     no medications  . TIA (transient ischemic attack) 2008    was seen on CT   . Congestive heart failure (CHF)   . Stroke   . Dyspnea   . Diverticulosis   . Kidney stone   . Dysuria   . Hyperlipemia   . Heart disease   . Toe infection     Past Surgical History  Procedure Laterality Date  . Rotator cuff repair Right   . Cardiac catheterization N/A 12/01/2014    Procedure: Right/Left Heart Cath and Coronary Angiography;  Surgeon: Jettie Booze, MD;  Location: [facility];  Service: Cardiovascular;  Laterality: N/A;  . Colonoscopy  2013     Medications: Current Outpatient Prescriptions  Medication Sig Dispense Refill  . aspirin 81 MG tablet Take 81 mg by mouth daily.    . Cholecalciferol 2000 UNITS CAPS Take 1 capsule by mouth daily.    . furosemide (LASIX) 20 MG tablet Take 1 tablet (20 mg total) by mouth daily as needed for fluid or edema. May use extra prn weight gain 60 tablet 6  . GARLIC PO Take 1 tablet by mouth daily.    Marland Kitchen warfarin (COUMADIN) 5 MG tablet Take  2.5-5 mg by mouth daily. Take a whole tablet every day except on mondays and fridays take 2.10m    . lisinopril (ZESTRIL) 2.5 MG tablet Take 1 tablet (2.5 mg total) by mouth 2 (two) times daily. (Patient not taking: Reported on 03/12/2015) 60 tablet 6  . metoprolol succinate (TOPROL-XL) 25 MG 24 hr tablet Take 0.5 tablets (12.5 mg total) by mouth daily. (Patient not taking: Reported on 03/12/2015) 30 tablet 6   No current facility-administered medications for this visit.    Allergies: Allergies  Allergen Reactions  . Actos [Pioglitazone] Other (See Comments)    Slow HR and pain in face   . Anesthetics, Halogenated     Slept too long  . Crestor [Rosuvastatin Calcium]     Pain   . Sulfa Antibiotics Other (See Comments)    hallucinations  . Dye Fdc Red [Red Dye] Rash     # 40    Social History: The patient  reports that he has never smoked. He does not have any smokeless tobacco history on file. He reports that he does not drink alcohol or use illicit drugs.   Family History: The patient's family history includes Crohn's disease in his brother; Deep vein thrombosis in his father; Heart attack in his father; Hypertension in his mother; Schizophrenia in his sister; Stroke in his mother.   Review of Systems: Please see the history of present illness.   Otherwise, the review of systems is positive for none.   All other systems are reviewed and negative.   Physical Exam: VS:  BP 136/70 mmHg  Pulse 62  Ht 5' 7"  (1.702 m)  Wt 92.534 kg (204 lb)  BMI 31.94 kg/m2  SpO2 98% .  BMI Body mass index is 31.94 kg/(m^2).  Wt Readings from Last 3 Encounters:  03/12/15 92.534 kg (204 lb)  02/12/15 92.08 kg (203 lb)  01/17/15 91.989 kg (202 lb 12.8 oz)    General: Pleasant. Well developed, well nourished and in no acute distress.  HEENT: Normal. Neck: Supple, no JVD, carotid bruits, or masses noted.  Cardiac: Regular rate and rhythm. No murmurs, rubs, or gallops. No edema.  Respiratory:  Lungs are clear to auscultation bilaterally with normal work of breathing.  GI: Soft and nontender.  MS: No deformity or atrophy. Gait and ROM intact. Skin: Warm and dry. Color is normal.  Neuro:  Strength and sensation are intact and no gross focal deficits noted.  Psych: Alert, appropriate and with normal affect.   LABORATORY DATA:  EKG:  EKG is not ordered today.   Lab Results  Component Value Date   WBC 7.8 12/02/2014   HGB 14.3 12/02/2014   HCT 43.9 12/02/2014   PLT 196 12/02/2014   GLUCOSE 82 01/17/2015   CHOL 201* 11/29/2014   TRIG 120 11/29/2014   HDL 30* 11/29/2014   LDLCALC 147* 11/29/2014   NA 139 01/17/2015   K 4.1 01/17/2015   CL 105 01/17/2015   CREATININE 1.02 01/17/2015   BUN 20 01/17/2015   CO2 27 01/17/2015   TSH 4.347 11/29/2014   INR  1.49 12/02/2014   HGBA1C 6.1* 11/29/2014    BNP (last 3 results)  Recent Labs  11/28/14 1851  BNP 732.6*    ProBNP (last 3 results)  Recent Labs  12/18/14 0948  PROBNP 413.0*     Other Studies Reviewed Today:    Current medicines are reviewed with the patient today.  The patient does not have concerns regarding medicines other than what  has been noted above.  The following changes have been made:  See above.  Labs/ tests ordered today include:    No orders of the defined types were placed in this encounter.     Disposition:   FU with Dr. Acie Fredrickson as planned after echo.   Patient is agreeable to this plan and will call if any problems develop in the interim.   Assessment and plan 1. Chronic systolic congestive  heart failure: The patient was on low-dose lisinopril and low-dose metoprolol. His echocardiogram reveals a persistently depressed left ventricular systolic function. His ejection fraction remains at 25-30%. He was very hesitant to agree to an ICD pacemaker. In fact he stopped the lisinopril and metoprolol and states he feels better.  I spent a great time trying to express the importance of the medical therapy for this.  I explained the risk of sudden cardiac death given his persistently low EF.   I explained that the medications were actually helping his heart and that the need for the pacemaker was not because heart rate was too slow..  He's willing to restart the lisinopril 2.5 mg a day. His heart rate is slow and he does not want to start the metoprolol. His heart rate was in the 40s and he felt quite poorly on even a low-dose of metoprolol.  I talked to the patient and wife for 20 minutes  Discussing CHF , medical therapy, benefits of ICD.  Risks of sudden cardiac death .   I'll see him again in 3 months for follow-up office visit. We'll get an echocardiaogram one week prior to his office visit.  2. Hyperlipidemia:  3. History of DVT: Continue  Coumadin.    Michael Randolph, Wonda Cheng, MD  03/12/2015 11:58 AM    [address],  [place], [place]  [address] Pager [phone] Phone: [phone]; Fax: [phone]   [facility]  [address] [place], [place]  [address] [phone]   Fax [phone]

## 2015-03-12 NOTE — Patient Instructions (Signed)
Medication Instructions:  START LISINOPRIL 2.5 MG DAILY  Labwork: NONE  Testing/Procedures: Your physician has requested that you have an echocardiogram. Echocardiography is a painless test that uses sound waves to create images of your heart. It provides your doctor with information about the size and shape of your heart and how well your heart's chambers and valves are working. This procedure takes approximately one hour. There are no restrictions for this procedure. 1 WEEK PRIOR TO 3 MONTH OV   Follow-Up: Your physician recommends that you schedule a follow-up appointment in: 3 MONTH OV

## 2015-03-13 DIAGNOSIS — Z7901 Long term (current) use of anticoagulants: Secondary | ICD-10-CM | POA: Diagnosis not present

## 2015-03-13 DIAGNOSIS — I82403 Acute embolism and thrombosis of unspecified deep veins of lower extremity, bilateral: Secondary | ICD-10-CM | POA: Diagnosis not present

## 2015-03-15 DIAGNOSIS — N401 Enlarged prostate with lower urinary tract symptoms: Secondary | ICD-10-CM | POA: Diagnosis not present

## 2015-03-15 DIAGNOSIS — E119 Type 2 diabetes mellitus without complications: Secondary | ICD-10-CM | POA: Diagnosis not present

## 2015-03-15 DIAGNOSIS — Z125 Encounter for screening for malignant neoplasm of prostate: Secondary | ICD-10-CM | POA: Diagnosis not present

## 2015-03-15 DIAGNOSIS — Z0000 Encounter for general adult medical examination without abnormal findings: Secondary | ICD-10-CM | POA: Diagnosis not present

## 2015-03-15 DIAGNOSIS — E785 Hyperlipidemia, unspecified: Secondary | ICD-10-CM | POA: Diagnosis not present

## 2015-03-22 DIAGNOSIS — I5041 Acute combined systolic (congestive) and diastolic (congestive) heart failure: Secondary | ICD-10-CM | POA: Diagnosis not present

## 2015-03-22 DIAGNOSIS — R001 Bradycardia, unspecified: Secondary | ICD-10-CM | POA: Diagnosis not present

## 2015-03-22 DIAGNOSIS — Z23 Encounter for immunization: Secondary | ICD-10-CM | POA: Diagnosis not present

## 2015-03-22 DIAGNOSIS — D692 Other nonthrombocytopenic purpura: Secondary | ICD-10-CM | POA: Diagnosis not present

## 2015-03-22 DIAGNOSIS — Z6831 Body mass index (BMI) 31.0-31.9, adult: Secondary | ICD-10-CM | POA: Diagnosis not present

## 2015-03-22 DIAGNOSIS — I509 Heart failure, unspecified: Secondary | ICD-10-CM | POA: Diagnosis not present

## 2015-03-22 DIAGNOSIS — G2581 Restless legs syndrome: Secondary | ICD-10-CM | POA: Diagnosis not present

## 2015-03-22 DIAGNOSIS — I2510 Atherosclerotic heart disease of native coronary artery without angina pectoris: Secondary | ICD-10-CM | POA: Diagnosis not present

## 2015-03-22 DIAGNOSIS — Z0000 Encounter for general adult medical examination without abnormal findings: Secondary | ICD-10-CM | POA: Diagnosis not present

## 2015-03-22 DIAGNOSIS — N200 Calculus of kidney: Secondary | ICD-10-CM | POA: Diagnosis not present

## 2015-03-22 DIAGNOSIS — G4733 Obstructive sleep apnea (adult) (pediatric): Secondary | ICD-10-CM | POA: Diagnosis not present

## 2015-03-22 DIAGNOSIS — I272 Other secondary pulmonary hypertension: Secondary | ICD-10-CM | POA: Diagnosis not present

## 2015-03-29 ENCOUNTER — Ambulatory Visit (INDEPENDENT_AMBULATORY_CARE_PROVIDER_SITE_OTHER): Payer: Medicare Other | Admitting: Neurology

## 2015-03-29 ENCOUNTER — Encounter: Payer: Self-pay | Admitting: Neurology

## 2015-03-29 VITALS — BP 135/66 | HR 52 | Resp 18 | Ht 67.0 in | Wt 200.0 lb

## 2015-03-29 DIAGNOSIS — E669 Obesity, unspecified: Secondary | ICD-10-CM | POA: Diagnosis not present

## 2015-03-29 DIAGNOSIS — G4733 Obstructive sleep apnea (adult) (pediatric): Secondary | ICD-10-CM

## 2015-03-29 NOTE — Patient Instructions (Signed)
We will go ahead with CPAP treatment at home and see you in follow up in about 3 months.

## 2015-03-29 NOTE — Progress Notes (Signed)
Subjective:    Patient ID: Michael Randolph is a 80 y.o. male.  HPI     Interim history:   Michael Randolph is a 80 year old right-handed gentleman with an underlying complex medical history of DVT, TIA, hyperlipidemia, type 2 diabetes, coronary artery disease, congestive heart failure, diverticulosis, kidney stone, allergic rhinitis, BPH, and obesity, who presents for follow-up consultation of his sleep disorder, in particular his obstructive sleep apnea and discussion of his sleep study results. The patient is accompanied by his wife again today. I first met him on 02/12/2015 at the request of his primary care physician, at which time the patient reported snoring, witnessed apneas, and nonrestorative sleep. He had been hospitalized in May 2016 for CHF. I invited him back for sleep study. He had a split-night sleep study on 02/27/2015 and I went over his test results with him in detail today. Baseline sleep efficiency was 89.3% with a latency to sleep of 9 minutes and wake after sleep onset of 7.5 minutes. He had an elevated arousal index. He had PVCs and PACs on EKG. He had absence of REM sleep. He had a total of 18 obstructive apneas and 29 obstructive hypopneas. AHI was 20.6 per hour, average oxygen saturation 94%, nadir was 86%. He was then titrated on CPAP therapy from 5-6 cm. Sleep efficiency was 86%, latency to sleep was 29.5 minutes and wake after sleep onset was 3.5 minutes. He had an increased percentage of REM sleep at 36.8%, average oxygen saturation was 95%, nadir was 90%. He had mild PLMS during the treatment portion of the study with minimal arousals. On CPAP of 6 cm pressure his AHI was 0.8 per hour. Based on his test results I prescribed CPAP therapy for home use and asked him to start treatment at home. He reported that he was reluctant to start therapy and he was offered a follow-up appointment.   Today, 03/29/2015: He reports no new symptoms. He had a follow-up with his cardiologist on  03/12/2015 and I reviewed the note. ICD placement was discussed, but as I understand, the patient is reluctant to pursue this. His most recent EF was 25-30%. He has a follow-up with his cardiologist in November. He felt that he did not have enough information about the sleep study to start CPAP therapy at home. We talked about his test results in detail.  Previously:   02/12/2015: He reports snoring, witnessed apneas, and non restorative sleep. His ESS is 4/24 today and his FSS is 12/63 today. He reports occasional morning headaches and has nocturia 1 times a night.   He was hospitalized in May from 11/28/14 to 12/02/14 for CHF. I reviewed the hospital records, including the discharged summary. His EF was 35-40%. He had the flu in November 2015. He snores only mildly per wife and she has noted apneic pauses in his sleep.  He typically goes to bed between 10:30 and 11:30 PM. He falls asleep within 20 or 30 minutes. He does not take a sleep aid. His rise time is between 6:50 and 7:10 AM. He does not typically wake up well rested. He sometimes takes a nap. He denies any restless leg symptoms or twitching in his sleep. He has a family history of obstructive sleep apnea in his son. He has never had a sleep study. He drinks caffeine in the form of coffee in the morning and drinks decaf tea at lunch. He does not drink alcohol.   His Past Medical History Is Significant For: Past Medical  History  Diagnosis Date  . DVT (deep venous thrombosis)   . Diabetes mellitus without complication     no medications  . TIA (transient ischemic attack) 2008    was seen on CT   . Congestive heart failure (CHF)   . Stroke   . Dyspnea   . Diverticulosis   . Kidney stone   . Dysuria   . Hyperlipemia   . Heart disease   . Toe infection     His Past Surgical History Is Significant For: Past Surgical History  Procedure Laterality Date  . Rotator cuff repair Right   . Cardiac catheterization N/A 12/01/2014     Procedure: Right/Left Heart Cath and Coronary Angiography;  Surgeon: Jettie Booze, MD;  Location: [facility];  Service: Cardiovascular;  Laterality: N/A;  . Colonoscopy  2013    His Family History Is Significant For: Family History  Problem Relation Age of Onset  . Stroke Mother   . Hypertension Mother   . Heart attack Father   . Deep vein thrombosis Father   . Crohn's disease Brother   . Schizophrenia Sister     His Social History Is Significant For: Social History   Social History  . Marital Status: Married    Spouse Name: N/A  . Number of Children: 3  . Years of Education: Associates   Occupational History  . Retired     Social History Main Topics  . Smoking status: Never Smoker   . Smokeless tobacco: None  . Alcohol Use: No  . Drug Use: No  . Sexual Activity: Not Asked   Other Topics Concern  . None   Social History Narrative   Drinks 1-2 cups of coffee a day     His Allergies Are:  Allergies  Allergen Reactions  . Actos [Pioglitazone] Other (See Comments)    Slow HR and pain in face   . Anesthetics, Halogenated     Slept too long  . Crestor [Rosuvastatin Calcium]     Pain   . Sulfa Antibiotics Other (See Comments)    hallucinations  . Dye Fdc Red [Red Dye] Rash    # 40  :   His Current Medications Are:  Outpatient Encounter Prescriptions as of 03/29/2015  Medication Sig  . aspirin 81 MG tablet Take 81 mg by mouth daily.  . Cholecalciferol 2000 UNITS CAPS Take 1 capsule by mouth daily.  . furosemide (LASIX) 20 MG tablet Take 1 tablet (20 mg total) by mouth daily as needed for fluid or edema. May use extra prn weight gain  . GARLIC PO Take 1 tablet by mouth daily.  Marland Kitchen lisinopril (ZESTRIL) 2.5 MG tablet Take 1 tablet (2.5 mg total) by mouth daily.  Marland Kitchen warfarin (COUMADIN) 5 MG tablet Take 2.5-5 mg by mouth daily. Take a whole tablet every day except on mondays and fridays take 2.20m  . [DISCONTINUED] metoprolol succinate (TOPROL-XL) 25 MG  24 hr tablet Take 0.5 tablets (12.5 mg total) by mouth daily. (Patient not taking: Reported on 03/12/2015)   No facility-administered encounter medications on file as of 03/29/2015.  :  Review of Systems:  Out of a complete 14 point review of systems, all are reviewed and negative with the exception of these symptoms as listed below:   Review of Systems  Neurological:       Patient is here to discuss sleep study.     Objective:  Neurologic Exam  Physical Exam Physical Examination:   Filed Vitals:  03/29/15 0859  BP: 135/66  Pulse: 52  Resp: 18   General Examination: The patient is a very pleasant 80 y.o. male in no acute distress. He appears well-developed and well-nourished and well groomed.   HEENT: Normocephalic, atraumatic, pupils are equal, round and reactive to light and accommodation. Extraocular tracking is good without limitation to gaze excursion or nystagmus noted. Normal smooth pursuit is noted. Hearing is grossly intact. Face is symmetric with normal facial animation and normal facial sensation. Speech is clear with no dysarthria noted. There is no hypophonia. There is no lip, neck/head, jaw or voice tremor. Neck is supple with full range of passive and active motion. There are no carotid bruits on auscultation. Oropharynx exam reveals: mild mouth dryness, adequate dental hygiene and moderate airway crowding, due to wider tongue, longer uvula and tonsils in place. Mallampati is class II. Tongue protrudes centrally and palate elevates symmetrically. Tonsils are 1+ on the right and smaller on the left.   Chest: Clear to auscultation without wheezing, rhonchi or crackles noted.  Heart: S1+S2+0, regular and normal without murmurs, rubs or gallops noted.   Abdomen: Soft, non-tender and non-distended with normal bowel sounds appreciated on auscultation.  Extremities: There is no pitting edema in the distal lower extremities bilaterally. Pedal pulses are intact.  Skin: Warm  and dry without trophic changes noted. There are no varicose veins.  Musculoskeletal: exam reveals no obvious joint deformities, tenderness or joint swelling or erythema.   Neurologically:  Mental status: The patient is awake, alert and oriented in all 4 spheres. His immediate and remote memory, attention, language skills and fund of knowledge are appropriate. There is no evidence of aphasia, agnosia, apraxia or anomia. Speech is clear with normal prosody and enunciation. Thought process is linear. Mood is normal and affect is normal.  Cranial nerves II - XII are as described above under HEENT exam. In addition: shoulder shrug is normal with equal shoulder height noted. Motor exam: Normal bulk, strength and tone is noted. There is no drift, tremor or rebound. Romberg is negative. Reflexes are 2+ throughout. Fine motor skills and coordination: intact.  Sensory exam: intact to light touch in the upper and lower extremities.  Gait, station and balance: He stands easily. No veering to one side is noted. No leaning to one side is noted. Posture is age-appropriate and stance is narrow based. Gait shows normal stride length and normal pace. No problems turning are noted. He turns en bloc.             Assessment and Plan:   In summary, DHYAN NOAH is a very pleasant 80 year old male with an underlying complex medical history of DVT, TIA, hyperlipidemia, type 2 diabetes, coronary artery disease, congestive heart failure, diverticulosis, kidney stone, allergic rhinitis, BPH, and obesity, who presents for follow-up consultation of his obstructive sleep apnea. He had a sleep study which was a split-night sleep study about a month ago we talked about the test results in detail today. He is advised about his diagnosis of moderate obstructive sleep apnea. He had absence of REM sleep during the first part of the study. He did quite well with low pressure CPAP at 6 cm. He achieved quite a bit of REM rebound during  the second portion of the study. He had some PLMS but no significant arousals from these. He had some EKG changes. He is in the process of being evaluated for a ICD but is reluctant to pursue this as I understand. He  has an appointment with his cardiologist in November. I talked to the patient at length about obstructive sleep apnea and in particular the risks and ramifications of untreated moderate to severe OSA, especially with respect to developing cardiovascular disease down the Road, including congestive heart failure, difficult to treat hypertension, cardiac arrhythmias, or stroke. Even type 2 diabetes has, in part, been linked to untreated OSA. Symptoms of untreated OSA include daytime sleepiness, memory problems, mood irritability and mood disorder such as depression and anxiety, lack of energy, as well as recurrent headaches, especially morning headaches. We talked about trying to maintain a healthy lifestyle in general, as well as the importance of weight control. I recommended the following at this time: I would like for him to get started on CPAP therapy. He is in agreement. I would like to see him back in about 10 weeks for recheck. I explained the CPAP option to him and encouraged him to give it a shot. I explained the importance of being compliant with PAP treatment, not only for insurance purposes but primarily to improve His symptoms, and for the patient's long term health benefit, including to reduce His cardiovascular risks. I answered all their multiple questions today and the patient and his wife were in agreement.  I spent 25 minutes in total face-to-face time with the patient, more than 50% of which was spent in counseling and coordination of care, reviewing test results, reviewing medication and discussing or reviewing the diagnosis of OSA, its prognosis and treatment options.

## 2015-04-03 DIAGNOSIS — Z1212 Encounter for screening for malignant neoplasm of rectum: Secondary | ICD-10-CM | POA: Diagnosis not present

## 2015-04-10 DIAGNOSIS — I82403 Acute embolism and thrombosis of unspecified deep veins of lower extremity, bilateral: Secondary | ICD-10-CM | POA: Diagnosis not present

## 2015-04-10 DIAGNOSIS — Z7901 Long term (current) use of anticoagulants: Secondary | ICD-10-CM | POA: Diagnosis not present

## 2015-04-10 DIAGNOSIS — Z23 Encounter for immunization: Secondary | ICD-10-CM | POA: Diagnosis not present

## 2015-05-16 DIAGNOSIS — Z7901 Long term (current) use of anticoagulants: Secondary | ICD-10-CM | POA: Diagnosis not present

## 2015-05-16 DIAGNOSIS — I82403 Acute embolism and thrombosis of unspecified deep veins of lower extremity, bilateral: Secondary | ICD-10-CM | POA: Diagnosis not present

## 2015-05-23 ENCOUNTER — Encounter: Payer: Self-pay | Admitting: Neurology

## 2015-05-23 ENCOUNTER — Ambulatory Visit (INDEPENDENT_AMBULATORY_CARE_PROVIDER_SITE_OTHER): Payer: Medicare Other | Admitting: Neurology

## 2015-05-23 ENCOUNTER — Telehealth: Payer: Self-pay

## 2015-05-23 VITALS — BP 138/72 | HR 68 | Resp 16 | Ht 67.0 in | Wt 204.0 lb

## 2015-05-23 DIAGNOSIS — E669 Obesity, unspecified: Secondary | ICD-10-CM

## 2015-05-23 DIAGNOSIS — Z9989 Dependence on other enabling machines and devices: Principal | ICD-10-CM

## 2015-05-23 DIAGNOSIS — G4733 Obstructive sleep apnea (adult) (pediatric): Secondary | ICD-10-CM

## 2015-05-23 NOTE — Telephone Encounter (Signed)
Patient came to appt today. He needs larger nasal pillows but is not up for new supplies until next month. I called [facility] and they stated that patient could come by today and will give him med nasal pillows (extra in the store) to hold him over until he qualifies for new supplies.

## 2015-05-23 NOTE — Progress Notes (Signed)
Subjective:    Patient ID: Michael Randolph is a 80 y.o. male.  HPI     Interim history:   Michael Randolph is a 80 year old right-handed gentleman with an underlying complex medical history of DVT, TIA, hyperlipidemia, type 2 diabetes, coronary artery disease, congestive heart failure, diverticulosis, kidney stone, allergic rhinitis, BPH, and obesity, who presents for follow-up consultation of his obstructive sleep apnea after starting CPAP therapy at home. The patient is unaccompanied today. I last saw him on 03/29/2015, at which time we went over his test results in detail. He was reluctant to pursue CPAP therapy. He had met with his cardiologist and a potential ICD placement was discussed, but as I understand, the patient was reluctant to pursue this at the time. His most recent EF was 25-30%. He felt that he did not have enough information after the sleep study to start therapy. He agreed to give CPAP a try at home.  Today, 05/23/2015: I reviewed his CPAP compliance data from 04/22/2015 through 05/21/2015 which is a total of 30 days during which time he used his machine every night with percent used days greater than 4 hours at 100%, indicating superb compliance with an average usage of 5 hours and 14 minutes, residual AHI slightly up at 6 per hour, leak acceptable with the 95th percentile at 20.6 L/m on a pressure of 6 cm with EPR.  Today, 05/23/2015: He reports doing about the same, CPAP is felt to be a nuisance, but he is fully compliant with treatment. Nocturia is about 50% better than before. He uses nasal pillows. He is trying to lose weight. He has lost some inches around his belly he reports. He has an appointment for an echocardiogram this week and follow-up with cardiology next week.   Previously:  I first met him on 02/12/2015 at the request of his primary care physician, at which time the patient reported snoring, witnessed apneas, and nonrestorative sleep. He had been hospitalized in  May 2016 for CHF. I invited him back for sleep study. He had a split-night sleep study on 02/27/2015 and I went over his test results with him in detail today. Baseline sleep efficiency was 89.3% with a latency to sleep of 9 minutes and wake after sleep onset of 7.5 minutes. He had an elevated arousal index. He had PVCs and PACs on EKG. He had absence of REM sleep. He had a total of 18 obstructive apneas and 29 obstructive hypopneas. AHI was 20.6 per hour, average oxygen saturation 94%, nadir was 86%. He was then titrated on CPAP therapy from 5-6 cm. Sleep efficiency was 86%, latency to sleep was 29.5 minutes and wake after sleep onset was 3.5 minutes. He had an increased percentage of REM sleep at 36.8%, average oxygen saturation was 95%, nadir was 90%. He had mild PLMS during the treatment portion of the study with minimal arousals. On CPAP of 6 cm pressure his AHI was 0.8 per hour. Based on his test results I prescribed CPAP therapy for home use and asked him to start treatment at home. He reported that he was reluctant to start therapy and he was offered a follow-up appointment.  02/12/2015: He reports snoring, witnessed apneas, and non restorative sleep. His ESS is 4/24 today and his FSS is 12/63 today. He reports occasional morning headaches and has nocturia 1 times a night.   He was hospitalized in May from 11/28/14 to 12/02/14 for CHF. I reviewed the hospital records, including the discharged summary. His EF  was 35-40%. He had the flu in November 2015. He snores only mildly per wife and she has noted apneic pauses in his sleep.   He typically goes to bed between 10:30 and 11:30 PM. He falls asleep within 20 or 30 minutes. He does not take a sleep aid. His rise time is between 6:50 and 7:10 AM. He does not typically wake up well rested. He sometimes takes a nap. He denies any restless leg symptoms or twitching in his sleep. He has a family history of obstructive sleep apnea in his son. He has never had a  sleep study. He drinks caffeine in the form of coffee in the morning and drinks decaf tea at lunch. He does not drink alcohol.  His Past Medical History Is Significant For: Past Medical History  Diagnosis Date  . DVT (deep venous thrombosis) ([place])   . Diabetes mellitus without complication ([facility])     no medications  . TIA (transient ischemic attack) 2008    was seen on CT   . Congestive heart failure (CHF) ([place])   . Stroke ([place])   . Dyspnea   . Diverticulosis   . Kidney stone   . Dysuria   . Hyperlipemia   . Heart disease   . Toe infection     His Past Surgical History Is Significant For: Past Surgical History  Procedure Laterality Date  . Rotator cuff repair Right   . Cardiac catheterization N/A 12/01/2014    Procedure: Right/Left Heart Cath and Coronary Angiography;  Surgeon: Jettie Booze, MD;  Location: [facility];  Service: Cardiovascular;  Laterality: N/A;  . Colonoscopy  2013    His Family History Is Significant For: Family History  Problem Relation Age of Onset  . Stroke Mother   . Hypertension Mother   . Heart attack Father   . Deep vein thrombosis Father   . Crohn's disease Brother   . Schizophrenia Sister     His Social History Is Significant For: Social History   Social History  . Marital Status: Married    Spouse Name: N/A  . Number of Children: 3  . Years of Education: Associates   Occupational History  . Retired     Social History Main Topics  . Smoking status: Never Smoker   . Smokeless tobacco: None  . Alcohol Use: No  . Drug Use: No  . Sexual Activity: Not Asked   Other Topics Concern  . None   Social History Narrative   Drinks 1-2 cups of coffee a day     His Allergies Are:  Allergies  Allergen Reactions  . Actos [Pioglitazone] Other (See Comments)    Slow HR and pain in face   . Anesthetics, Halogenated     Slept too long  . Crestor [Rosuvastatin Calcium]     Pain   . Sulfa Antibiotics Other (See Comments)     hallucinations  . Dye Fdc Red [Red Dye] Rash    # 40  :   His Current Medications Are:  Outpatient Encounter Prescriptions as of 05/23/2015  Medication Sig  . aspirin 81 MG tablet Take 81 mg by mouth daily.  . Cholecalciferol 2000 UNITS CAPS Take 1 capsule by mouth daily.  . furosemide (LASIX) 20 MG tablet Take 1 tablet (20 mg total) by mouth daily as needed for fluid or edema. May use extra prn weight gain  . GARLIC PO Take 1 tablet by mouth daily.  . Magnesium 250 MG TABS Take  by mouth.  . Methylsulfonylmethane (MSM) 1000 MG CAPS Take by mouth.  . warfarin (COUMADIN) 5 MG tablet Take 2.5-5 mg by mouth daily. Take a whole tablet every day except on mondays and fridays take 2.25m  . [DISCONTINUED] lisinopril (ZESTRIL) 2.5 MG tablet Take 1 tablet (2.5 mg total) by mouth daily.   No facility-administered encounter medications on file as of 05/23/2015.  :  Review of Systems:  Out of a complete 14 point review of systems, all are reviewed and negative with the exception of these symptoms as listed below:   Review of Systems  Neurological:       Patient reports that he does "ok" with CPAP. Patient has had some trouble with nasal mask on CPAP.     Objective:  Neurologic Exam  Physical Exam Physical Examination:   Filed Vitals:   05/23/15 0813  BP: 138/72  Pulse: 68  Resp: 16    General Examination: The patient is a very pleasant 80y.o. male in no acute distress. He appears well-developed and well-nourished and well groomed. He is in fairly good spirits today.   HEENT: Normocephalic, atraumatic, pupils are equal, round and reactive to light and accommodation. Extraocular tracking is good without limitation to gaze excursion or nystagmus noted. Normal smooth pursuit is noted. Hearing is grossly intact. Face is symmetric with normal facial animation and normal facial sensation. Speech is clear with no dysarthria noted. There is no hypophonia. There is no lip, neck/head, jaw or voice  tremor. Neck is supple with full range of passive and active motion. There are no carotid bruits on auscultation. Oropharynx exam reveals: mild mouth dryness, adequate dental hygiene and moderate airway crowding, due to wider tongue, longer uvula and tonsils in place. Mallampati is class II. Tongue protrudes centrally and palate elevates symmetrically. Tonsils are 1+ on the right and smaller on the left.   Chest: Clear to auscultation without wheezing, rhonchi or crackles noted.  Heart: S1+S2+0, regular and normal without murmurs, rubs or gallops noted.   Abdomen: Soft, non-tender and non-distended with normal bowel sounds appreciated on auscultation.  Extremities: There is trace pitting edema in the left distal lower extremity. Pedal pulses are intact.  Skin: Warm and dry without trophic changes noted. There are no varicose veins.  Musculoskeletal: exam reveals no obvious joint deformities, tenderness or joint swelling or erythema.   Neurologically:  Mental status: The patient is awake, alert and oriented in all 4 spheres. His immediate and remote memory, attention, language skills and fund of knowledge are appropriate. There is no evidence of aphasia, agnosia, apraxia or anomia. Speech is clear with normal prosody and enunciation. Thought process is linear. Mood is normal and affect is normal.  Cranial nerves II - XII are as described above under HEENT exam. In addition: shoulder shrug is normal with equal shoulder height noted. Motor exam: Normal bulk, strength and tone is noted. There is no drift, tremor or rebound. Romberg is negative. Reflexes are 1+ throughout. Fine motor skills and coordination: intact.  Sensory exam: intact to light touch in the upper and lower extremities.  Gait, station and balance: He stands easily. No veering to one side is noted. No leaning to one side is noted. Posture is age-appropriate and stance is narrow based. Gait shows normal stride length and normal pace. No  problems turning are noted. He turns en bloc.             Assessment and Plan:   In summary, RJORDON KRISTIANSEN  is a very pleasant 80 year old male with an underlying complex medical history of DVT, TIA, hyperlipidemia, type 2 diabetes, coronary artery disease, congestive heart failure, diverticulosis, kidney stone, allergic rhinitis, BPH, and obesity, who presents for follow-up consultation of his obstructive sleep apnea, now on treatment with CPAP at a pressure of 6 cm with superb compliance. He had a split-night sleep study  on 02/27/2015. We talked about his test results again today. They are again advised about his diagnosis of moderate obstructive sleep apnea. He had absence of REM sleep during the first part of the study, Which most likely underestimated his baseline AHI and desaturation nadir. During the sleep study he did quite well on CPAP at 6 cm. He had quite a bit of REM rebound during the second part of the study. He is followed by cardiology. He has an appointment for echocardiogram this week and follow-up with Dr. Acie Fredrickson next week. The patient is encouraged to continue with CPAP treatment. AHI is slightly suboptimal but I would like to keep his pressure the same. He is commended for his compliance despite not feeling significantly different or better after treatment, nevertheless, he endorses no significant tolerance issues and feels that his nocturia is about 50% better. He is trying to lose weight. He watches his sodium intake very vigilantly. I explained the importance of being compliant with PAP treatment, not only for insurance purposes but primarily to improve His symptoms, and for the patient's long term health benefit, including to reduce His cardiovascular risks.At this juncture, would like to see him back in 6 months, sooner if needed.  I answered all their multiple questions today and the patient and his wife were in agreement.  I spent 25 minutes in total face-to-face time with the  patient, more than 50% of which was spent in counseling and coordination of care, reviewing test results, reviewing medication and discussing or reviewing the diagnosis of OSA, its prognosis and treatment options.

## 2015-05-23 NOTE — Patient Instructions (Signed)

## 2015-05-25 ENCOUNTER — Ambulatory Visit (HOSPITAL_COMMUNITY): Payer: Medicare Other | Attending: Cardiovascular Disease

## 2015-05-25 ENCOUNTER — Other Ambulatory Visit: Payer: Self-pay

## 2015-05-25 ENCOUNTER — Other Ambulatory Visit: Payer: Self-pay | Admitting: Cardiovascular Disease

## 2015-05-25 DIAGNOSIS — Z8249 Family history of ischemic heart disease and other diseases of the circulatory system: Secondary | ICD-10-CM | POA: Insufficient documentation

## 2015-05-25 DIAGNOSIS — I509 Heart failure, unspecified: Secondary | ICD-10-CM | POA: Diagnosis not present

## 2015-05-25 DIAGNOSIS — E785 Hyperlipidemia, unspecified: Secondary | ICD-10-CM | POA: Diagnosis not present

## 2015-05-25 DIAGNOSIS — E119 Type 2 diabetes mellitus without complications: Secondary | ICD-10-CM | POA: Insufficient documentation

## 2015-05-25 DIAGNOSIS — I358 Other nonrheumatic aortic valve disorders: Secondary | ICD-10-CM | POA: Insufficient documentation

## 2015-05-25 DIAGNOSIS — I5022 Chronic systolic (congestive) heart failure: Secondary | ICD-10-CM

## 2015-05-25 DIAGNOSIS — I10 Essential (primary) hypertension: Secondary | ICD-10-CM | POA: Insufficient documentation

## 2015-05-25 DIAGNOSIS — I340 Nonrheumatic mitral (valve) insufficiency: Secondary | ICD-10-CM | POA: Insufficient documentation

## 2015-06-01 ENCOUNTER — Encounter: Payer: Self-pay | Admitting: Cardiovascular Disease

## 2015-06-01 ENCOUNTER — Ambulatory Visit (INDEPENDENT_AMBULATORY_CARE_PROVIDER_SITE_OTHER): Payer: Medicare Other | Admitting: Cardiovascular Disease

## 2015-06-01 VITALS — BP 132/74 | HR 63 | Ht 67.0 in | Wt 203.8 lb

## 2015-06-01 DIAGNOSIS — I5022 Chronic systolic (congestive) heart failure: Secondary | ICD-10-CM | POA: Diagnosis not present

## 2015-06-01 MED ORDER — SPIRONOLACTONE 25 MG PO TABS: 12.5000 mg | ORAL_TABLET | Freq: Every day | ORAL | Status: DC

## 2015-06-01 NOTE — Patient Instructions (Addendum)
Medication Instructions:  START Aldactone 12.5 mg once daily   Labwork: Your physician recommends that you return for lab work in: 1 week for basic metabolic panel   Your physician recommends that you return for lab work (basic metabolic panel) in: 3 months on the same day as your appointment with Dr. Acie Fredrickson   Testing/Procedures: None Ordered   Follow-Up: Your physician recommends that you schedule a follow-up appointment in: 3 months with Dr. Acie Fredrickson.    If you need a refill on your cardiac medications before your next appointment, please call your pharmacy.   Thank you for choosing [facility]! Christen Bame, RN [phone]

## 2015-06-01 NOTE — Progress Notes (Signed)
CARDIOLOGY OFFICE NOTE  Date:  06/01/2015    [name] Date of Birth: [birth year] Medical Record #[id number]  PCP:  Gwen Pounds, MD  Cardiologist:  Nahser    Chief Complaint  Patient presents with  . Follow-up    CHF    History of Present Illness: Michael Randolph is a 80 y.o. male who presents today for a follow up visit. This is a 3 week check.  He has DM, prior DVT on Coumadin, prior TIA. He has HLD and he is reluctant to use statin due to history of myalgias and rash.  Presented back in May with dyspnea, chest pain and productive cough. Found to have combined systolic and diastolic HF. EF is 35 to 40%. Diuresed and started on beta blocker and ACE. Also with VT - had cardiac cath showing mild nonobstructive CAD with severe LV dysfunction - EF 20 to 25% and moderate pulmonary HTN.   I have seen him twice since discharge. He was improved clinically. Had a UTI which I treated. Have changed his Lasix to just prn and tried to increase ACE. BP is limiting how much titration we can do.   Comes back today. Here with his wife. Doing ok. BP cuff correlates. BP readings at home much lower - in the 90's systolic. Breathing is good. No swelling. Weight is stable. Fleeting episode of dizziness. Going to [facility] to get a sleep work up.   March 12, 2015:   [name] was seen in the hospital in May. He has diastolic and systolic dysfunction. He had a cardiac catheterization which revealed minor coronary artery irregularities.  He was found to have a EF of 25-30% despite medical therapy.  It was recommended that he get an ICD/pacemaker. He's fairly adamant that he does not want a pacemaker at this time.  His heart rate and blood pressure had been quite low. He has discontinued the metoprolol and the lisinopril.  He's feeling quite a bit better than when I met him in the hospital in May. He's able to do all of his normal activities without severe chest pain or shortness  breath.   Nov. 18, 2016: Seems to be doing better.  Walks regularly .  Has been deer hunting .  Has stopped his Lisinopril because it made him feel poorly .  Brought his BP log.  Readings are all good.   105-120s   Past Medical History  Diagnosis Date  . DVT (deep venous thrombosis) (HCC)   . Diabetes mellitus without complication (HCC)     no medications  . TIA (transient ischemic attack) 2008    was seen on CT   . Congestive heart failure (CHF) (HCC)   . Stroke (HCC)   . Dyspnea   . Diverticulosis   . Kidney stone   . Dysuria   . Hyperlipemia   . Heart disease   . Toe infection     Past Surgical History  Procedure Laterality Date  . Rotator cuff repair Right   . Cardiac catheterization N/A 12/01/2014    Procedure: Right/Left Heart Cath and Coronary Angiography;  Surgeon: Corky Crafts, MD;  Location: [facility] INVASIVE CV LAB;  Service: Cardiovascular;  Laterality: N/A;  . Colonoscopy  2013     Medications: Current Outpatient Prescriptions  Medication Sig Dispense Refill  . aspirin 81 MG tablet Take 81 mg by mouth daily.    . Cholecalciferol 2000 UNITS CAPS Take 1 capsule by mouth daily.    Marland Kitchen  furosemide (LASIX) 20 MG tablet Take 1 tablet (20 mg total) by mouth daily as needed for fluid or edema. May use extra prn weight gain 60 tablet 6  . GARLIC PO Take 1 tablet by mouth daily.    . Magnesium 250 MG TABS Take 1 tablet by mouth daily.     . Methylsulfonylmethane (MSM) 1000 MG CAPS Take 1,000 mg by mouth daily.     Marland Kitchen warfarin (COUMADIN) 5 MG tablet Take 2.5-5 mg by mouth daily. Take a whole tablet every day except on mondays and fridays take 2.29m     No current facility-administered medications for this visit.    Allergies: Allergies  Allergen Reactions  . Actos [Pioglitazone] Other (See Comments)    Slow HR and pain in face   . Anesthetics, Halogenated     Slept too long  . Crestor [Rosuvastatin Calcium]     Pain   . Sulfa Antibiotics Other (See Comments)     hallucinations  . Dye Fdc Red [Red Dye] Rash    # 40    Social History: The patient  reports that he has never smoked. He does not have any smokeless tobacco history on file. He reports that he does not drink alcohol or use illicit drugs.   Family History: The patient's family history includes Crohn's disease in his brother; Deep vein thrombosis in his father; Heart attack in his father; Hypertension in his mother; Schizophrenia in his sister; Stroke in his mother.   Review of Systems: Please see the history of present illness.   Otherwise, the review of systems is positive for none.   All other systems are reviewed and negative.   Physical Exam: VS:  BP 132/74 mmHg  Pulse 63  Ht _0  (1.702 m)  Wt 203 lb 12.8 oz (92.443 kg)  BMI 31.91 kg/m2  SpO2 97% .  BMI Body mass index is 31.91 kg/(m^2).  Wt Readings from Last 3 Encounters:  06/01/15 203 lb 12.8 oz (92.443 kg)  05/23/15 204 lb (92.534 kg)  03/29/15 200 lb (90.719 kg)    General: Pleasant. Well developed, well nourished and in no acute distress.  HEENT: Normal. Neck: Supple, no JVD, carotid bruits, or masses noted.  Cardiac: Regular rate and rhythm. No murmurs, rubs, or gallops. No edema.  Respiratory:  Lungs are clear to auscultation bilaterally with normal work of breathing.  GI: Soft and nontender.  MS: No deformity or atrophy. Gait and ROM intact. Skin: Warm and dry. Color is normal.  Neuro:  Strength and sensation are intact and no gross focal deficits noted.  Psych: Alert, appropriate and with normal affect.   LABORATORY DATA:  EKG:  EKG is not ordered today.   Lab Results  Component Value Date   WBC 7.8 12/02/2014   HGB 14.3 12/02/2014   HCT 43.9 12/02/2014   PLT 196 12/02/2014   GLUCOSE 82 01/17/2015   CHOL 201* 11/29/2014   TRIG 120 11/29/2014   HDL 30* 11/29/2014   LDLCALC 147* 11/29/2014   NA 139 01/17/2015   K 4.1 01/17/2015   CL 105 01/17/2015   CREATININE 1.02 01/17/2015   BUN 20  01/17/2015   CO2 27 01/17/2015   TSH 4.347 11/29/2014   INR 1.49 12/02/2014   HGBA1C 6.1* 11/29/2014    BNP (last 3 results)  Recent Labs  11/28/14 1851  BNP 732.6*    ProBNP (last 3 results)  Recent Labs  12/18/14 0948  PROBNP 413.0*     Other  Studies Reviewed Today:    Current medicines are reviewed with the patient today.  The patient does not have concerns regarding medicines other than what has been noted above.  The following changes have been made:  See above.  Labs/ tests ordered today include:    No orders of the defined types were placed in this encounter.     Disposition:   FU with Dr. Acie Fredrickson as planned after echo.   Patient is agreeable to this plan and will call if any problems develop in the interim.   Assessment and plan 1. Chronic systolic congestive  heart failure: The patient stopped his metoprolol and his lisinopril. He states that these make him feel poorly. He brought his blood pressure log and his blood pressure readings are all fairly normal. His most recent echocardiogram shows very slight improvement in his left ventricular systolic function. His ejection fraction is now 30-35%. We'll try Aldactone 12.5 mg a day. We'll have him check a basic metabolic profile in one week.  I talked to the patient and wife for 20 minutes  Discussing CHF , medical therapy, benefits of ICD.  Risks of sudden cardiac death .   I'll see him again in 3 months for follow-up office visit. We'll get aa BMP at that time.  2. Hyperlipidemia:  3. History of DVT: Continue Coumadin.   Nahser, Wonda Cheng, MD  06/01/2015 11:25 AM    [address],  [place], [place]  [address] Pager [phone] Phone: [phone]; Fax: [phone]   [facility]  [address] [place],   [address] [phone]   Fax [phone]

## 2015-06-11 ENCOUNTER — Other Ambulatory Visit (INDEPENDENT_AMBULATORY_CARE_PROVIDER_SITE_OTHER): Payer: Medicare Other | Admitting: *Deleted

## 2015-06-11 DIAGNOSIS — I5022 Chronic systolic (congestive) heart failure: Secondary | ICD-10-CM

## 2015-06-11 DIAGNOSIS — I509 Heart failure, unspecified: Secondary | ICD-10-CM | POA: Diagnosis not present

## 2015-06-11 LAB — BASIC METABOLIC PANEL
BUN: 20 mg/dL (ref 7–25)
CO2: 27 mmol/L (ref 20–31)
Calcium: 9.8 mg/dL (ref 8.6–10.3)
Chloride: 105 mmol/L (ref 98–110)
Creat: 1.01 mg/dL (ref 0.70–1.18)
Glucose, Bld: 99 mg/dL (ref 65–99)
Potassium: 4.9 mmol/L (ref 3.5–5.3)
Sodium: 140 mmol/L (ref 135–146)

## 2015-06-19 ENCOUNTER — Ambulatory Visit: Payer: Medicare Other | Admitting: Neurology

## 2015-06-20 DIAGNOSIS — Z7901 Long term (current) use of anticoagulants: Secondary | ICD-10-CM | POA: Diagnosis not present

## 2015-06-20 DIAGNOSIS — I82403 Acute embolism and thrombosis of unspecified deep veins of lower extremity, bilateral: Secondary | ICD-10-CM | POA: Diagnosis not present

## 2015-07-26 DIAGNOSIS — I82403 Acute embolism and thrombosis of unspecified deep veins of lower extremity, bilateral: Secondary | ICD-10-CM | POA: Diagnosis not present

## 2015-07-26 DIAGNOSIS — Z7901 Long term (current) use of anticoagulants: Secondary | ICD-10-CM | POA: Diagnosis not present

## 2015-08-30 DIAGNOSIS — I82403 Acute embolism and thrombosis of unspecified deep veins of lower extremity, bilateral: Secondary | ICD-10-CM | POA: Diagnosis not present

## 2015-08-30 DIAGNOSIS — Z7901 Long term (current) use of anticoagulants: Secondary | ICD-10-CM | POA: Diagnosis not present

## 2015-09-10 ENCOUNTER — Encounter: Payer: Self-pay | Admitting: Cardiovascular Disease

## 2015-09-10 ENCOUNTER — Other Ambulatory Visit: Payer: Medicare Other | Admitting: *Deleted

## 2015-09-10 ENCOUNTER — Ambulatory Visit (INDEPENDENT_AMBULATORY_CARE_PROVIDER_SITE_OTHER): Payer: Medicare Other | Admitting: Cardiovascular Disease

## 2015-09-10 VITALS — BP 138/76 | HR 58 | Ht 67.0 in | Wt 205.8 lb

## 2015-09-10 DIAGNOSIS — I5022 Chronic systolic (congestive) heart failure: Secondary | ICD-10-CM | POA: Diagnosis not present

## 2015-09-10 NOTE — Progress Notes (Signed)
CARDIOLOGY OFFICE NOTE  Date:  09/10/2015    [name] Date of Birth: [birth year] Medical Record #[id number]  PCP:  [name], MD  Cardiologist:  Nahser    Chief Complaint  Patient presents with  . Congestive Heart Failure    NO COMPLAINTS  . Follow-up    History of Present Illness: Michael Randolph is a 81 y.o. male who presents today for a follow up visit. This is a 3 week check.  He has DM, prior DVT on Coumadin, prior TIA. He has HLD and he is reluctant to use statin due to history of myalgias and rash.  Presented back in May with dyspnea, chest pain and productive cough. Found to have combined systolic and diastolic HF. EF is 35 to 40%. Diuresed and started on beta blocker and ACE. Also with VT - had cardiac cath showing mild nonobstructive CAD with severe LV dysfunction - EF 20 to 25% and moderate pulmonary HTN.   I have seen him twice since discharge. He was improved clinically. Had a UTI which I treated. Have changed his Lasix to just prn and tried to increase ACE. BP is limiting how much titration we can do.   Comes back today. Here with his wife. Doing ok. BP cuff correlates. BP readings at home much lower - in the 38'U systolic. Breathing is good. No swelling. Weight is stable. Fleeting episode of dizziness. Going to [facility] to get a sleep work up.   March 12, 2015:   Michael Randolph was seen in the hospital in May. He has diastolic and systolic dysfunction. He had a cardiac catheterization which revealed minor coronary artery irregularities.  He was found to have a EF of 25-30% despite medical therapy.  It was recommended that he get an ICD/pacemaker. He's fairly adamant that he does not want a pacemaker at this time.  His heart rate and blood pressure had been quite low. He has discontinued the metoprolol and the lisinopril.  He's feeling quite a bit better than when I met him in the hospital in May. He's able to do all of his normal activities  without severe chest pain or shortness breath.   Nov. 18, 2016: Seems to be doing better.  Walks regularly .  Has been deer hunting .  Has stopped his Lisinopril because it made him feel poorly .  Brought his BP log.  Readings are all good.   105-120s   Feb. 27, 2017:  Doing ok Did not tolerate the Lisinopril - so he stopped it Did not tolerate the Aldactone  - stopped it   Past Medical History  Diagnosis Date  . DVT (deep venous thrombosis) ([place])   . Diabetes mellitus without complication (HCC)     no medications  . TIA (transient ischemic attack) 2008    was seen on CT   . Congestive heart failure (CHF) ([facility] [place])   . Stroke ([facility])   . Dyspnea   . Diverticulosis   . Kidney stone   . Dysuria   . Hyperlipemia   . Heart disease   . Toe infection     Past Surgical History  Procedure Laterality Date  . Rotator cuff repair Right   . Cardiac catheterization N/A 12/01/2014    Procedure: Right/Left Heart Cath and Coronary Angiography;  Surgeon: Jettie Booze, MD;  Location: [facility];  Service: Cardiovascular;  Laterality: N/A;  . Colonoscopy  2013     Medications: Current Outpatient Prescriptions  Medication Sig Dispense Refill  . aspirin 81 MG tablet Take 81 mg by mouth daily.    . Cholecalciferol 2000 UNITS CAPS Take 1 capsule by mouth daily.    . furosemide (LASIX) 20 MG tablet Take 1 tablet (20 mg total) by mouth daily as needed for fluid or edema. May use extra prn weight gain 60 tablet 6  . GARLIC PO Take 1 tablet by mouth daily.    . Magnesium 250 MG TABS Take 1 tablet by mouth daily.     . Methylsulfonylmethane (MSM) 1000 MG CAPS Take 1,000 mg by mouth daily.     Marland Kitchen warfarin (COUMADIN) 5 MG tablet Take 2.5-5 mg by mouth daily. Take a whole tablet every day except on mondays and fridays take 2.58m    . spironolactone (ALDACTONE) 25 MG tablet Take 0.5 tablets (12.5 mg total) by mouth daily. (Patient not taking: Reported on 09/10/2015) 45 tablet 3   No  current facility-administered medications for this visit.    Allergies: Allergies  Allergen Reactions  . Actos [Pioglitazone] Other (See Comments)    Slow HR and pain in face   . Anesthetics, Halogenated     Slept too long  . Crestor [Rosuvastatin Calcium]     Pain   . Sulfa Antibiotics Other (See Comments)    hallucinations  . Dye Fdc Red [Red Dye] Rash    # 40    Social History: The patient  reports that he has never smoked. He does not have any smokeless tobacco history on file. He reports that he does not drink alcohol or use illicit drugs.   Family History: The patient's family history includes Crohn's disease in his brother; Deep vein thrombosis in his father; Heart attack in his father; Hypertension in his mother; Schizophrenia in his sister; Stroke in his mother.   Review of Systems: Please see the history of present illness.   Otherwise, the review of systems is positive for none.   All other systems are reviewed and negative.   Physical Exam: VS:  BP 138/76 mmHg  Pulse 58  Ht 5' 7"  (1.702 m)  Wt 205 lb 12.8 oz (93.35 kg)  BMI 32.23 kg/m2 .  BMI Body mass index is 32.23 kg/(m^2).  Wt Readings from Last 3 Encounters:  09/10/15 205 lb 12.8 oz (93.35 kg)  06/01/15 203 lb 12.8 oz (92.443 kg)  05/23/15 204 lb (92.534 kg)    General: Pleasant. Well developed, well nourished and in no acute distress.  HEENT: Normal. Neck: Supple, no JVD, carotid bruits, or masses noted.  Cardiac: Regular rate and rhythm. No murmurs, rubs, or gallops. No edema.  Respiratory:  Lungs are clear to auscultation bilaterally with normal work of breathing.  GI: Soft and nontender.  MS: No deformity or atrophy. Gait and ROM intact. Skin: Warm and dry. Color is normal.  Neuro:  Strength and sensation are intact and no gross focal deficits noted.  Psych: Alert, appropriate and with normal affect.   LABORATORY DATA:  EKG:  EKG is not ordered today.   Lab Results  Component Value Date    WBC 7.8 12/02/2014   HGB 14.3 12/02/2014   HCT 43.9 12/02/2014   PLT 196 12/02/2014   GLUCOSE 99 06/11/2015   CHOL 201* 11/29/2014   TRIG 120 11/29/2014   HDL 30* 11/29/2014   LDLCALC 147* 11/29/2014   NA 140 06/11/2015   K 4.9 06/11/2015   CL 105 06/11/2015   CREATININE 1.01 06/11/2015   BUN 20  06/11/2015   CO2 27 06/11/2015   TSH 4.347 11/29/2014   INR 1.49 12/02/2014   HGBA1C 6.1* 11/29/2014    BNP (last 3 results)  Recent Labs  11/28/14 1851  BNP 732.6*    ProBNP (last 3 results)  Recent Labs  12/18/14 0948  PROBNP 413.0*     Other Studies Reviewed Today:    Current medicines are reviewed with the patient today.  The patient does not have concerns regarding medicines other than what has been noted above.  The following changes have been made:  See above.  Labs/ tests ordered today include:    No orders of the defined types were placed in this encounter.     Disposition:   FU with Dr. Acie Fredrickson as planned after echo.   Patient is agreeable to this plan and will call if any problems develop in the interim.   Assessment and plan 1. Chronic systolic congestive  heart failure: The patient stopped his metoprolol and his lisinopril. He states that these make him feel poorly. He brought his blood pressure log and his blood pressure readings are all fairly normal. His most recent echocardiogram shows very slight improvement in his left ventricular systolic function. His ejection fraction is now 30-35%. He has now Stopped taking his Aldactone. It's been very difficult to keep him on any standard therapy for congestive heart failure. For sleep, his left ventricle systolic function seems to be improving slightly.  I'll see him again in 6 months for follow-up visit. I anticipate repeating his echocardiogram around that time. He is not interested in an ICD.    2. Hyperlipidemia:  3. History of DVT: Continue Coumadin.   Nahser, Wonda Cheng, MD  09/10/2015 11:48 AM      [address],  [place], [place]  [address] Pager [phone] Phone: [phone]; Fax: [phone]   [facility]  [address] [place], [place]  [address] [phone]   Fax [phone]

## 2015-09-10 NOTE — Patient Instructions (Signed)
Medication Instructions:  Your physician recommends that you continue on your current medications as directed. Please refer to the Current Medication list given to you today.   Labwork: Your physician recommends that you return for lab work in: 6 months (on the same day as your appointment with Dr. Acie Fredrickson) for complete metabolic panel   Testing/Procedures: None Ordered   Follow-Up: Your physician wants you to follow-up in: 6 months with Dr. Acie Fredrickson.  You will receive a reminder letter in the mail two months in advance. If you don't receive a letter, please call our office to schedule the follow-up appointment.   If you need a refill on your cardiac medications before your next appointment, please call your pharmacy.   Thank you for choosing [facility]! Christen Bame, RN [phone]

## 2015-09-21 DIAGNOSIS — R001 Bradycardia, unspecified: Secondary | ICD-10-CM | POA: Diagnosis not present

## 2015-09-21 DIAGNOSIS — I272 Other secondary pulmonary hypertension: Secondary | ICD-10-CM | POA: Diagnosis not present

## 2015-09-21 DIAGNOSIS — Z1389 Encounter for screening for other disorder: Secondary | ICD-10-CM | POA: Diagnosis not present

## 2015-09-21 DIAGNOSIS — E784 Other hyperlipidemia: Secondary | ICD-10-CM | POA: Diagnosis not present

## 2015-09-21 DIAGNOSIS — I5041 Acute combined systolic (congestive) and diastolic (congestive) heart failure: Secondary | ICD-10-CM | POA: Diagnosis not present

## 2015-09-21 DIAGNOSIS — Z7901 Long term (current) use of anticoagulants: Secondary | ICD-10-CM | POA: Diagnosis not present

## 2015-09-21 DIAGNOSIS — D692 Other nonthrombocytopenic purpura: Secondary | ICD-10-CM | POA: Diagnosis not present

## 2015-09-21 DIAGNOSIS — E119 Type 2 diabetes mellitus without complications: Secondary | ICD-10-CM | POA: Diagnosis not present

## 2015-09-21 DIAGNOSIS — I509 Heart failure, unspecified: Secondary | ICD-10-CM | POA: Diagnosis not present

## 2015-09-21 DIAGNOSIS — E668 Other obesity: Secondary | ICD-10-CM | POA: Diagnosis not present

## 2015-09-21 DIAGNOSIS — Z6832 Body mass index (BMI) 32.0-32.9, adult: Secondary | ICD-10-CM | POA: Diagnosis not present

## 2015-10-03 DIAGNOSIS — Z7901 Long term (current) use of anticoagulants: Secondary | ICD-10-CM | POA: Diagnosis not present

## 2015-10-03 DIAGNOSIS — I82403 Acute embolism and thrombosis of unspecified deep veins of lower extremity, bilateral: Secondary | ICD-10-CM | POA: Diagnosis not present

## 2015-10-31 DIAGNOSIS — I82403 Acute embolism and thrombosis of unspecified deep veins of lower extremity, bilateral: Secondary | ICD-10-CM | POA: Diagnosis not present

## 2015-10-31 DIAGNOSIS — Z7901 Long term (current) use of anticoagulants: Secondary | ICD-10-CM | POA: Diagnosis not present

## 2015-11-21 ENCOUNTER — Ambulatory Visit (INDEPENDENT_AMBULATORY_CARE_PROVIDER_SITE_OTHER): Payer: Medicare Other | Admitting: Neurology

## 2015-11-21 ENCOUNTER — Encounter: Payer: Self-pay | Admitting: Neurology

## 2015-11-21 VITALS — BP 132/78 | HR 72 | Resp 18 | Ht 67.0 in | Wt 208.0 lb

## 2015-11-21 DIAGNOSIS — E669 Obesity, unspecified: Secondary | ICD-10-CM | POA: Diagnosis not present

## 2015-11-21 DIAGNOSIS — G4733 Obstructive sleep apnea (adult) (pediatric): Secondary | ICD-10-CM | POA: Diagnosis not present

## 2015-11-21 DIAGNOSIS — Z9989 Dependence on other enabling machines and devices: Principal | ICD-10-CM

## 2015-11-21 NOTE — Patient Instructions (Signed)
Please continue using your CPAP regularly. While your insurance requires that you use CPAP at least 4 hours each night on 70% of the nights, I recommend, that you not skip any nights and use it throughout the night if you can. Getting used to CPAP and staying with the treatment long term does take time and patience and discipline. Untreated obstructive sleep apnea when it is moderate to severe can have an adverse impact on cardiovascular health and raise her risk for heart disease, arrhythmias, hypertension, congestive heart failure, stroke and diabetes. Untreated obstructive sleep apnea causes sleep disruption, nonrestorative sleep, and sleep deprivation. This can have an impact on your day to day functioning and cause daytime sleepiness and impairment of cognitive function, memory loss, mood disturbance, and problems focussing. Using CPAP regularly can improve these symptoms.  Keep up the good work! I will see you back in 1 year for sleep apnea check up.  

## 2015-11-21 NOTE — Progress Notes (Signed)
Subjective:    Patient ID: Michael Randolph is a 81 y.o. male.  HPI     Interim history:   Michael Randolph is a 81 year old right-handed gentleman with an underlying complex medical history of DVT, TIA, hyperlipidemia, type 2 diabetes, coronary artery disease, congestive heart failure, diverticulosis, kidney stone, allergic rhinitis, BPH, and obesity, who presents for follow-up consultation of his obstructive sleep apnea, on CPAP therapy at home. The patient is accompanied by his wife again today. I last saw him on 05/23/2015, at which time he felt he was doing about the same, felt CPAP to be more of a nuisance but he was fully compliant with treatment. He did admit that nocturia has improved by about 50%. He was trying to lose weight. Follow-up appointment with cardiology pending. I encouraged him to continue to be compliant with CPAP therapy.   Today, 11/21/2015: I reviewed his CPAP compliance data from 10/21/2015 through 11/19/2015 which is a total of 30 days during which time he used his machine 29 days with percent used days greater than 4 hours at 97%, indicating excellent compliance with an average usage of 4 hours and 43 minutes, residual AHI suboptimal at 6.9 per hour, leak acceptable with the 95th percentile at 18.7 L/m on a pressure of 6 cm with EPR of 3.   Today, 11/21/2015: He reports doing well with CPAP, compliant with treatment, no new issues. Has been sleeping about 5 hours with it, tolerating it, has accepted it too.   Previously:   I saw him on 03/29/2015, at which time we went over his test results in detail. He was reluctant to pursue CPAP therapy. He had met with his cardiologist and a potential ICD placement was discussed, but as I understand, the patient was reluctant to pursue this at the time. His most recent EF was 25-30%. He felt that he did not have enough information after the sleep study to start therapy. He agreed to give CPAP a try at home.  I reviewed his CPAP  compliance data from 04/22/2015 through 05/21/2015 which is a total of 30 days during which time he used his machine every night with percent used days greater than 4 hours at 100%, indicating superb compliance with an average usage of 5 hours and 14 minutes, residual AHI slightly up at 6 per hour, leak acceptable with the 95th percentile at 20.6 L/m on a pressure of 6 cm with EPR.  I first met him on 02/12/2015 at the request of his primary care physician, at which time the patient reported snoring, witnessed apneas, and nonrestorative sleep. He had been hospitalized in May 2016 for CHF. I invited him back for sleep study. He had a split-night sleep study on 02/27/2015 and I went over his test results with him in detail today. Baseline sleep efficiency was 89.3% with a latency to sleep of 9 minutes and wake after sleep onset of 7.5 minutes. He had an elevated arousal index. He had PVCs and PACs on EKG. He had absence of REM sleep. He had a total of 18 obstructive apneas and 29 obstructive hypopneas. AHI was 20.6 per hour, average oxygen saturation 94%, nadir was 86%. He was then titrated on CPAP therapy from 5-6 cm. Sleep efficiency was 86%, latency to sleep was 29.5 minutes and wake after sleep onset was 3.5 minutes. He had an increased percentage of REM sleep at 36.8%, average oxygen saturation was 95%, nadir was 90%. He had mild PLMS during the treatment portion of the  study with minimal arousals. On CPAP of 6 cm pressure his AHI was 0.8 per hour. Based on his test results I prescribed CPAP therapy for home use and asked him to start treatment at home. He reported that he was reluctant to start therapy and he was offered a follow-up appointment.  02/12/2015: He reports snoring, witnessed apneas, and non restorative sleep. His ESS is 4/24 today and his FSS is 12/63 today. He reports occasional morning headaches and has nocturia 1 times a night.   He was hospitalized in May from 11/28/14 to 12/02/14 for CHF. I  reviewed the hospital records, including the discharged summary. His EF was 35-40%. He had the flu in November 2015. He snores only mildly per wife and she has noted apneic pauses in his sleep.   He typically goes to bed between 10:30 and 11:30 PM. He falls asleep within 20 or 30 minutes. He does not take a sleep aid. His rise time is between 6:50 and 7:10 AM. He does not typically wake up well rested. He sometimes takes a nap. He denies any restless leg symptoms or twitching in his sleep. He has a family history of obstructive sleep apnea in his son. He has never had a sleep study. He drinks caffeine in the form of coffee in the morning and drinks decaf tea at lunch. He does not drink alcohol.  His Past Medical History Is Significant For: Past Medical History  Diagnosis Date  . DVT (deep venous thrombosis) ([place])   . Diabetes mellitus without complication (HCC)     no medications  . TIA (transient ischemic attack) 2008    was seen on CT   . Congestive heart failure (CHF) ([place])   . Stroke ([place])   . Dyspnea   . Diverticulosis   . Kidney stone   . Dysuria   . Hyperlipemia   . Heart disease   . Toe infection     His Past Surgical History Is Significant For: Past Surgical History  Procedure Laterality Date  . Rotator cuff repair Right   . Cardiac catheterization N/A 12/01/2014    Procedure: Right/Left Heart Cath and Coronary Angiography;  Surgeon: Jettie Booze, MD;  Location: [facility];  Service: Cardiovascular;  Laterality: N/A;  . Colonoscopy  2013    His Family History Is Significant For: Family History  Problem Relation Age of Onset  . Stroke Mother   . Hypertension Mother   . Heart attack Father   . Deep vein thrombosis Father   . Crohn's disease Brother   . Schizophrenia Sister     His Social History Is Significant For: Social History   Social History  . Marital Status: Married    Spouse Name: N/A  . Number of Children: 3  . Years of Education:  Associates   Occupational History  . Retired     Social History Main Topics  . Smoking status: Never Smoker   . Smokeless tobacco: None  . Alcohol Use: No  . Drug Use: No  . Sexual Activity: Not Asked   Other Topics Concern  . None   Social History Narrative   Drinks 1-2 cups of coffee a day     His Allergies Are:  Allergies  Allergen Reactions  . Actos [Pioglitazone] Other (See Comments)    Slow HR and pain in face   . Anesthetics, Halogenated     Slept too long  . Crestor [Rosuvastatin Calcium]     Pain   .  Sulfa Antibiotics Other (See Comments)    hallucinations  . Dye Fdc Red [Red Dye] Rash    # 40  :   His Current Medications Are:  Outpatient Encounter Prescriptions as of 11/21/2015  Medication Sig  . aspirin 81 MG tablet Take 81 mg by mouth daily.  . Cholecalciferol 2000 UNITS CAPS Take 2 capsules by mouth daily.   . furosemide (LASIX) 20 MG tablet Take 1 tablet (20 mg total) by mouth daily as needed for fluid or edema. May use extra prn weight gain  . GARLIC PO Take 1 tablet by mouth daily.  . Magnesium 250 MG TABS Take 2 tablets by mouth daily.   . Methylsulfonylmethane (MSM) 1000 MG CAPS Take 1,000 mg by mouth daily.   Marland Kitchen warfarin (COUMADIN) 5 MG tablet Take 5 mg by mouth daily.    No facility-administered encounter medications on file as of 11/21/2015.  :  Review of Systems:  Out of a complete 14 point review of systems, all are reviewed and negative with the exception of these symptoms as listed below:   Review of Systems  Neurological:       Patient is here for f/u. States he is doing well with CPAP. No new concerns.     Objective:  Neurologic Exam  Physical Exam Physical Examination:   Filed Vitals:   11/21/15 1207  BP: 132/78  Pulse: 72  Resp: 18    General Examination: The patient is a very pleasant 81 y.o. male in no acute distress. He appears well-developed and well-nourished and well groomed. He is in good spirits today.   HEENT:  Normocephalic, atraumatic, pupils are equal, round and reactive to light and accommodation. Extraocular tracking is good without limitation to gaze excursion or nystagmus noted. Normal smooth pursuit is noted. Hearing is grossly intact. Face is symmetric with normal facial animation and normal facial sensation. Speech is clear with no dysarthria noted. There is no hypophonia. There is no lip, neck/head, jaw or voice tremor. Neck is supple with full range of passive and active motion. There are no carotid bruits on auscultation. Oropharynx exam reveals: mild mouth dryness, adequate dental hygiene and moderate airway crowding, due to wider tongue, longer uvula and tonsils in place. Mallampati is class II. Tongue protrudes centrally and palate elevates symmetrically. Tonsils are 1+ on the right and smaller on the left.   Chest: Clear to auscultation without wheezing, rhonchi or crackles noted.  Heart: S1+S2+0, regular and normal without murmurs, rubs or gallops noted.   Abdomen: Soft, non-tender and non-distended with normal bowel sounds appreciated on auscultation.  Extremities: There is no pitting edema in the left distal lower extremity. Pedal pulses are intact.  Skin: Warm and dry without trophic changes noted. There are no varicose veins.  Musculoskeletal: exam reveals no obvious joint deformities, tenderness or joint swelling or erythema.   Neurologically:  Mental status: The patient is awake, alert and oriented in all 4 spheres. His immediate and remote memory, attention, language skills and fund of knowledge are appropriate. There is no evidence of aphasia, agnosia, apraxia or anomia. Speech is clear with normal prosody and enunciation. Thought process is linear. Mood is normal and affect is normal.  Cranial nerves II - XII are as described above under HEENT exam. In addition: shoulder shrug is normal with equal shoulder height noted. Motor exam: Normal bulk, strength and tone is noted. There  is no drift, tremor or rebound. Romberg is negative. Reflexes are 1+ throughout. Fine motor skills  and coordination: intact.  Sensory exam: intact to light touch in the upper and lower extremities.  Gait, station and balance: He stands easily. No veering to one side is noted. No leaning to one side is noted. Posture is age-appropriate and stance is narrow based. Gait shows normal stride length and normal pace. No problems turning are noted. He turns en bloc. Tandem walk slightly challenging.             Assessment and Plan:   In summary, Michael Randolph is a very pleasant 81 year old male with an underlying complex medical history of DVT, TIA, hyperlipidemia, type 2 diabetes, coronary artery disease, congestive heart failure, diverticulosis, kidney stone, allergic rhinitis, BPH, and obesity, who presents for follow-up consultation of his obstructive sleep apnea, now on treatment with CPAP at a pressure of 6 cm with Excellent compliance. He had a split-night sleep study on 02/27/2015 and we briefly reviewed his test results again today. We talked about his compliance data as well. He has done quite well with CPAP at 6 cm, AHI continues to be slightly suboptimal but we mutually agreed to keep his pressure the same. He had a recent checkup with his cardiologist in February 2017. He has a follow-up in August. He is commended for his treatment adherence. His physical and neurological exam is stable. Weight has been fairly stable, fluctuates within mostly a 5 pound range. He had quite a bit of REM rebound during the second part of the study, and I explained to him that this is typically indicative of a positive response to CPAP therapy for sleep apnea. He did endorse that his nocturia improved about 50% after he started treatment. From my end of things he is doing well. He is advised to follow-up in about a year, sooner if needed. I answered all her questions today and the patient and his wife were in agreement. I  spent 25 minutes in total face-to-face time with the patient, more than 50% of which was spent in counseling and coordination of care, reviewing test results, reviewing medication and discussing or reviewing the diagnosis of OSA, its prognosis and treatment options.

## 2015-11-28 DIAGNOSIS — Z7901 Long term (current) use of anticoagulants: Secondary | ICD-10-CM | POA: Diagnosis not present

## 2015-11-28 DIAGNOSIS — I82403 Acute embolism and thrombosis of unspecified deep veins of lower extremity, bilateral: Secondary | ICD-10-CM | POA: Diagnosis not present

## 2015-12-26 DIAGNOSIS — Z7901 Long term (current) use of anticoagulants: Secondary | ICD-10-CM | POA: Diagnosis not present

## 2015-12-26 DIAGNOSIS — I82403 Acute embolism and thrombosis of unspecified deep veins of lower extremity, bilateral: Secondary | ICD-10-CM | POA: Diagnosis not present

## 2016-01-23 DIAGNOSIS — Z7901 Long term (current) use of anticoagulants: Secondary | ICD-10-CM | POA: Diagnosis not present

## 2016-01-23 DIAGNOSIS — I82403 Acute embolism and thrombosis of unspecified deep veins of lower extremity, bilateral: Secondary | ICD-10-CM | POA: Diagnosis not present

## 2016-02-20 DIAGNOSIS — Z7901 Long term (current) use of anticoagulants: Secondary | ICD-10-CM | POA: Diagnosis not present

## 2016-02-20 DIAGNOSIS — I82403 Acute embolism and thrombosis of unspecified deep veins of lower extremity, bilateral: Secondary | ICD-10-CM | POA: Diagnosis not present

## 2016-03-18 DIAGNOSIS — E119 Type 2 diabetes mellitus without complications: Secondary | ICD-10-CM | POA: Diagnosis not present

## 2016-03-18 DIAGNOSIS — E784 Other hyperlipidemia: Secondary | ICD-10-CM | POA: Diagnosis not present

## 2016-03-18 DIAGNOSIS — Z125 Encounter for screening for malignant neoplasm of prostate: Secondary | ICD-10-CM | POA: Diagnosis not present

## 2016-03-24 DIAGNOSIS — E668 Other obesity: Secondary | ICD-10-CM | POA: Diagnosis not present

## 2016-03-24 DIAGNOSIS — E119 Type 2 diabetes mellitus without complications: Secondary | ICD-10-CM | POA: Diagnosis not present

## 2016-03-24 DIAGNOSIS — I272 Other secondary pulmonary hypertension: Secondary | ICD-10-CM | POA: Diagnosis not present

## 2016-03-24 DIAGNOSIS — I509 Heart failure, unspecified: Secondary | ICD-10-CM | POA: Diagnosis not present

## 2016-03-24 DIAGNOSIS — Z23 Encounter for immunization: Secondary | ICD-10-CM | POA: Diagnosis not present

## 2016-03-24 DIAGNOSIS — E784 Other hyperlipidemia: Secondary | ICD-10-CM | POA: Diagnosis not present

## 2016-03-24 DIAGNOSIS — Z6833 Body mass index (BMI) 33.0-33.9, adult: Secondary | ICD-10-CM | POA: Diagnosis not present

## 2016-03-24 DIAGNOSIS — Z86718 Personal history of other venous thrombosis and embolism: Secondary | ICD-10-CM | POA: Diagnosis not present

## 2016-03-24 DIAGNOSIS — Z0000 Encounter for general adult medical examination without abnormal findings: Secondary | ICD-10-CM | POA: Diagnosis not present

## 2016-03-24 DIAGNOSIS — G4733 Obstructive sleep apnea (adult) (pediatric): Secondary | ICD-10-CM | POA: Diagnosis not present

## 2016-03-24 DIAGNOSIS — Z7901 Long term (current) use of anticoagulants: Secondary | ICD-10-CM | POA: Diagnosis not present

## 2016-04-09 DIAGNOSIS — Z7901 Long term (current) use of anticoagulants: Secondary | ICD-10-CM | POA: Diagnosis not present

## 2016-04-09 DIAGNOSIS — I82403 Acute embolism and thrombosis of unspecified deep veins of lower extremity, bilateral: Secondary | ICD-10-CM | POA: Diagnosis not present

## 2016-05-07 DIAGNOSIS — I82403 Acute embolism and thrombosis of unspecified deep veins of lower extremity, bilateral: Secondary | ICD-10-CM | POA: Diagnosis not present

## 2016-05-07 DIAGNOSIS — Z7901 Long term (current) use of anticoagulants: Secondary | ICD-10-CM | POA: Diagnosis not present

## 2016-05-16 ENCOUNTER — Inpatient Hospital Stay (HOSPITAL_COMMUNITY)
Admission: EM | Admit: 2016-05-16 | Discharge: 2016-05-19 | DRG: 871 | Disposition: A | Discharge status: Home / Self Care | Payer: Medicare Other | Attending: Internal Medicine | Admitting: Internal Medicine

## 2016-05-16 ENCOUNTER — Inpatient Hospital Stay (HOSPITAL_COMMUNITY): Payer: Medicare Other

## 2016-05-16 ENCOUNTER — Emergency Department (HOSPITAL_COMMUNITY): Payer: Medicare Other

## 2016-05-16 ENCOUNTER — Encounter (HOSPITAL_COMMUNITY): Payer: Self-pay | Admitting: Emergency Medicine

## 2016-05-16 DIAGNOSIS — R778 Other specified abnormalities of plasma proteins: Secondary | ICD-10-CM | POA: Diagnosis present

## 2016-05-16 DIAGNOSIS — A419 Sepsis, unspecified organism: Secondary | ICD-10-CM | POA: Diagnosis not present

## 2016-05-16 DIAGNOSIS — Z79899 Other long term (current) drug therapy: Secondary | ICD-10-CM | POA: Diagnosis not present

## 2016-05-16 DIAGNOSIS — J181 Lobar pneumonia, unspecified organism: Secondary | ICD-10-CM | POA: Diagnosis not present

## 2016-05-16 DIAGNOSIS — Z823 Family history of stroke: Secondary | ICD-10-CM | POA: Diagnosis not present

## 2016-05-16 DIAGNOSIS — Z8673 Personal history of transient ischemic attack (TIA), and cerebral infarction without residual deficits: Secondary | ICD-10-CM

## 2016-05-16 DIAGNOSIS — Z6832 Body mass index (BMI) 32.0-32.9, adult: Secondary | ICD-10-CM

## 2016-05-16 DIAGNOSIS — I82503 Chronic embolism and thrombosis of unspecified deep veins of lower extremity, bilateral: Secondary | ICD-10-CM

## 2016-05-16 DIAGNOSIS — I472 Ventricular tachycardia: Secondary | ICD-10-CM | POA: Diagnosis not present

## 2016-05-16 DIAGNOSIS — E669 Obesity, unspecified: Secondary | ICD-10-CM | POA: Diagnosis present

## 2016-05-16 DIAGNOSIS — R079 Chest pain, unspecified: Secondary | ICD-10-CM | POA: Diagnosis not present

## 2016-05-16 DIAGNOSIS — I5041 Acute combined systolic (congestive) and diastolic (congestive) heart failure: Secondary | ICD-10-CM | POA: Diagnosis not present

## 2016-05-16 DIAGNOSIS — E872 Acidosis: Secondary | ICD-10-CM | POA: Diagnosis present

## 2016-05-16 DIAGNOSIS — J14 Pneumonia due to Hemophilus influenzae: Secondary | ICD-10-CM | POA: Diagnosis present

## 2016-05-16 DIAGNOSIS — G4733 Obstructive sleep apnea (adult) (pediatric): Secondary | ICD-10-CM | POA: Diagnosis present

## 2016-05-16 DIAGNOSIS — R0902 Hypoxemia: Secondary | ICD-10-CM

## 2016-05-16 DIAGNOSIS — I5023 Acute on chronic systolic (congestive) heart failure: Secondary | ICD-10-CM

## 2016-05-16 DIAGNOSIS — Z8249 Family history of ischemic heart disease and other diseases of the circulatory system: Secondary | ICD-10-CM | POA: Diagnosis not present

## 2016-05-16 DIAGNOSIS — E119 Type 2 diabetes mellitus without complications: Secondary | ICD-10-CM

## 2016-05-16 DIAGNOSIS — R7989 Other specified abnormal findings of blood chemistry: Secondary | ICD-10-CM | POA: Diagnosis present

## 2016-05-16 DIAGNOSIS — R0602 Shortness of breath: Secondary | ICD-10-CM | POA: Diagnosis not present

## 2016-05-16 DIAGNOSIS — J9601 Acute respiratory failure with hypoxia: Secondary | ICD-10-CM | POA: Diagnosis present

## 2016-05-16 DIAGNOSIS — Z7901 Long term (current) use of anticoagulants: Secondary | ICD-10-CM | POA: Diagnosis not present

## 2016-05-16 DIAGNOSIS — I4729 Other ventricular tachycardia: Secondary | ICD-10-CM

## 2016-05-16 DIAGNOSIS — R748 Abnormal levels of other serum enzymes: Secondary | ICD-10-CM | POA: Diagnosis present

## 2016-05-16 DIAGNOSIS — Z7982 Long term (current) use of aspirin: Secondary | ICD-10-CM | POA: Diagnosis not present

## 2016-05-16 DIAGNOSIS — I5043 Acute on chronic combined systolic (congestive) and diastolic (congestive) heart failure: Secondary | ICD-10-CM | POA: Diagnosis not present

## 2016-05-16 DIAGNOSIS — I5042 Chronic combined systolic (congestive) and diastolic (congestive) heart failure: Secondary | ICD-10-CM | POA: Diagnosis not present

## 2016-05-16 DIAGNOSIS — E784 Other hyperlipidemia: Secondary | ICD-10-CM | POA: Diagnosis not present

## 2016-05-16 DIAGNOSIS — N179 Acute kidney failure, unspecified: Secondary | ICD-10-CM | POA: Diagnosis not present

## 2016-05-16 DIAGNOSIS — J189 Pneumonia, unspecified organism: Secondary | ICD-10-CM

## 2016-05-16 DIAGNOSIS — N183 Chronic kidney disease, stage 3 (moderate): Secondary | ICD-10-CM | POA: Diagnosis not present

## 2016-05-16 DIAGNOSIS — I110 Hypertensive heart disease with heart failure: Secondary | ICD-10-CM | POA: Diagnosis present

## 2016-05-16 DIAGNOSIS — R509 Fever, unspecified: Secondary | ICD-10-CM | POA: Diagnosis not present

## 2016-05-16 DIAGNOSIS — Z86718 Personal history of other venous thrombosis and embolism: Secondary | ICD-10-CM

## 2016-05-16 DIAGNOSIS — I5022 Chronic systolic (congestive) heart failure: Secondary | ICD-10-CM | POA: Diagnosis not present

## 2016-05-16 DIAGNOSIS — H1030 Unspecified acute conjunctivitis, unspecified eye: Secondary | ICD-10-CM | POA: Diagnosis present

## 2016-05-16 DIAGNOSIS — A413 Sepsis due to Hemophilus influenzae: Principal | ICD-10-CM | POA: Diagnosis present

## 2016-05-16 DIAGNOSIS — E785 Hyperlipidemia, unspecified: Secondary | ICD-10-CM | POA: Diagnosis present

## 2016-05-16 DIAGNOSIS — Z6834 Body mass index (BMI) 34.0-34.9, adult: Secondary | ICD-10-CM | POA: Diagnosis not present

## 2016-05-16 DIAGNOSIS — R042 Hemoptysis: Secondary | ICD-10-CM | POA: Diagnosis not present

## 2016-05-16 DIAGNOSIS — I509 Heart failure, unspecified: Secondary | ICD-10-CM | POA: Diagnosis not present

## 2016-05-16 DIAGNOSIS — I82409 Acute embolism and thrombosis of unspecified deep veins of unspecified lower extremity: Secondary | ICD-10-CM | POA: Diagnosis present

## 2016-05-16 DIAGNOSIS — R0901 Asphyxia: Secondary | ICD-10-CM | POA: Diagnosis not present

## 2016-05-16 DIAGNOSIS — I2510 Atherosclerotic heart disease of native coronary artery without angina pectoris: Secondary | ICD-10-CM | POA: Diagnosis not present

## 2016-05-16 DIAGNOSIS — I82403 Acute embolism and thrombosis of unspecified deep veins of lower extremity, bilateral: Secondary | ICD-10-CM | POA: Diagnosis not present

## 2016-05-16 LAB — BRAIN NATRIURETIC PEPTIDE: B Natriuretic Peptide: 1280.2 pg/mL — ABNORMAL HIGH (ref 0.0–100.0)

## 2016-05-16 LAB — BASIC METABOLIC PANEL
ANION GAP: 11 (ref 5–15)
BUN: 26 mg/dL — AB (ref 6–20)
CO2: 22 mmol/L (ref 22–32)
Calcium: 8.3 mg/dL — ABNORMAL LOW (ref 8.9–10.3)
Chloride: 101 mmol/L (ref 101–111)
Creatinine, Ser: 1.3 mg/dL — ABNORMAL HIGH (ref 0.61–1.24)
GFR calc Af Amer: 59 mL/min — ABNORMAL LOW (ref >60)
GFR, EST NON AFRICAN AMERICAN: 51 mL/min — AB (ref >60)
Glucose, Bld: 194 mg/dL — ABNORMAL HIGH (ref 65–99)
POTASSIUM: 4.3 mmol/L (ref 3.5–5.1)
SODIUM: 134 mmol/L — AB (ref 135–145)

## 2016-05-16 LAB — CBC WITH DIFFERENTIAL/PLATELET
BASOS ABS: 0 10*3/uL (ref 0.0–0.1)
Basophils Relative: 0 %
EOS ABS: 0 10*3/uL (ref 0.0–0.7)
EOS PCT: 0 %
HCT: 40.6 % (ref 39.0–52.0)
Hemoglobin: 13.9 g/dL (ref 13.0–17.0)
Lymphocytes Relative: 7 %
Lymphs Abs: 1.3 10*3/uL (ref 0.7–4.0)
MCH: 27.4 pg (ref 26.0–34.0)
MCHC: 34.2 g/dL (ref 30.0–36.0)
MCV: 79.9 fL (ref 78.0–100.0)
Monocytes Absolute: 1.7 10*3/uL — ABNORMAL HIGH (ref 0.1–1.0)
Monocytes Relative: 9 %
Neutro Abs: 16 10*3/uL — ABNORMAL HIGH (ref 1.7–7.7)
Neutrophils Relative %: 84 %
PLATELETS: 156 10*3/uL (ref 150–400)
RBC: 5.08 MIL/uL (ref 4.22–5.81)
RDW: 12.9 % (ref 11.5–15.5)
WBC: 19 10*3/uL — AB (ref 4.0–10.5)

## 2016-05-16 LAB — URINALYSIS, ROUTINE W REFLEX MICROSCOPIC
Bilirubin Urine: NEGATIVE
GLUCOSE, UA: NEGATIVE mg/dL
KETONES UR: NEGATIVE mg/dL
Nitrite: NEGATIVE
PROTEIN: 100 mg/dL — AB
Specific Gravity, Urine: 1.024 (ref 1.005–1.030)
pH: 6 (ref 5.0–8.0)

## 2016-05-16 LAB — HEPATIC FUNCTION PANEL
ALBUMIN: 3.7 g/dL (ref 3.5–5.0)
ALT: 53 U/L (ref 17–63)
AST: 66 U/L — AB (ref 15–41)
Alkaline Phosphatase: 52 U/L (ref 38–126)
BILIRUBIN TOTAL: 2.1 mg/dL — AB (ref 0.3–1.2)
Bilirubin, Direct: 0.5 mg/dL (ref 0.1–0.5)
Indirect Bilirubin: 1.6 mg/dL — ABNORMAL HIGH (ref 0.3–0.9)
Total Protein: 6.9 g/dL (ref 6.5–8.1)

## 2016-05-16 LAB — URINE MICROSCOPIC-ADD ON
Bacteria, UA: NONE SEEN
RBC / HPF: NONE SEEN RBC/hpf (ref 0–5)

## 2016-05-16 LAB — PROTIME-INR
INR: 1.84
Prothrombin Time: 21.5 seconds — ABNORMAL HIGH (ref 11.4–15.2)

## 2016-05-16 LAB — GLUCOSE, CAPILLARY: Glucose-Capillary: 161 mg/dL — ABNORMAL HIGH (ref 65–99)

## 2016-05-16 LAB — I-STAT CG4 LACTIC ACID, ED: LACTIC ACID, VENOUS: 2.66 mmol/L — AB (ref 0.5–1.9)

## 2016-05-16 LAB — STREP PNEUMONIAE URINARY ANTIGEN: Strep Pneumo Urinary Antigen: NEGATIVE

## 2016-05-16 LAB — TROPONIN I
TROPONIN I: 0.11 ng/mL — AB (ref <0.03)
TROPONIN I: 0.1 ng/mL — AB (ref <0.03)
Troponin I: 0.11 ng/mL (ref <0.03)

## 2016-05-16 LAB — LACTIC ACID, PLASMA: Lactic Acid, Venous: 2.6 mmol/L (ref 0.5–1.9)

## 2016-05-16 MED ORDER — DEXTROSE 5 % IV SOLN
1.0000 g | Freq: Once | INTRAVENOUS | Status: AC
  Administered 2016-05-16: 1 g via INTRAVENOUS
  Filled 2016-05-16: qty 10

## 2016-05-16 MED ORDER — SODIUM CHLORIDE 0.9 % IV BOLUS (SEPSIS)
30.0000 mL/kg | Freq: Once | INTRAVENOUS | Status: AC
  Administered 2016-05-16: 2862 mL via INTRAVENOUS

## 2016-05-16 MED ORDER — MAGNESIUM OXIDE 400 (241.3 MG) MG PO TABS
200.0000 mg | ORAL_TABLET | Freq: Two times a day (BID) | ORAL | Status: DC
  Administered 2016-05-16 – 2016-05-19 (×6): 200 mg via ORAL
  Filled 2016-05-16 (×8): qty 1

## 2016-05-16 MED ORDER — DEXTROSE 5 % IV SOLN
500.0000 mg | INTRAVENOUS | Status: DC
  Administered 2016-05-17 – 2016-05-19 (×3): 500 mg via INTRAVENOUS
  Filled 2016-05-16 (×3): qty 500

## 2016-05-16 MED ORDER — ACETAMINOPHEN 500 MG PO TABS
1000.0000 mg | ORAL_TABLET | Freq: Once | ORAL | Status: AC
  Administered 2016-05-16: 1000 mg via ORAL
  Filled 2016-05-16: qty 2

## 2016-05-16 MED ORDER — VITAMIN D3 25 MCG (1000 UNIT) PO TABS
2000.0000 [IU] | ORAL_TABLET | Freq: Two times a day (BID) | ORAL | Status: DC
  Administered 2016-05-16 – 2016-05-19 (×6): 2000 [IU] via ORAL
  Filled 2016-05-16 (×6): qty 2

## 2016-05-16 MED ORDER — INSULIN ASPART 100 UNIT/ML ~~LOC~~ SOLN: 0.0000 [IU] | Freq: Every day | SUBCUTANEOUS | Status: DC

## 2016-05-16 MED ORDER — DEXTROSE 5 % IV SOLN
500.0000 mg | Freq: Once | INTRAVENOUS | Status: AC
  Administered 2016-05-16: 500 mg via INTRAVENOUS
  Filled 2016-05-16: qty 500

## 2016-05-16 MED ORDER — ENOXAPARIN SODIUM 100 MG/ML ~~LOC~~ SOLN
1.0000 mg/kg | Freq: Two times a day (BID) | SUBCUTANEOUS | Status: DC
  Administered 2016-05-17: 95 mg via SUBCUTANEOUS
  Filled 2016-05-16: qty 1

## 2016-05-16 MED ORDER — TOBRAMYCIN-DEXAMETHASONE 0.3-0.1 % OP OINT
TOPICAL_OINTMENT | Freq: Three times a day (TID) | OPHTHALMIC | Status: DC
  Administered 2016-05-16 – 2016-05-18 (×9): via OPHTHALMIC
  Filled 2016-05-16 (×2): qty 3.5

## 2016-05-16 MED ORDER — WARFARIN SODIUM 5 MG PO TABS
5.0000 mg | ORAL_TABLET | Freq: Once | ORAL | Status: AC
  Administered 2016-05-16: 5 mg via ORAL
  Filled 2016-05-16 (×2): qty 1

## 2016-05-16 MED ORDER — FUROSEMIDE 10 MG/ML IJ SOLN: 20.0000 mg | Freq: Once | INTRAMUSCULAR | Status: DC

## 2016-05-16 MED ORDER — WARFARIN - PHARMACIST DOSING INPATIENT
Freq: Every day | Status: DC
Start: 2016-05-16 — End: 2016-05-19
  Administered 2016-05-16: 20:00:00

## 2016-05-16 MED ORDER — IPRATROPIUM-ALBUTEROL 0.5-2.5 (3) MG/3ML IN SOLN
3.0000 mL | Freq: Once | RESPIRATORY_TRACT | Status: AC
  Administered 2016-05-16: 3 mL via RESPIRATORY_TRACT
  Filled 2016-05-16: qty 3

## 2016-05-16 MED ORDER — ENOXAPARIN SODIUM 100 MG/ML ~~LOC~~ SOLN
1.0000 mg/kg | SUBCUTANEOUS | Status: AC
  Administered 2016-05-16: 95 mg via SUBCUTANEOUS
  Filled 2016-05-16: qty 1

## 2016-05-16 MED ORDER — DEXTROSE 5 % IV SOLN
1.0000 g | INTRAVENOUS | Status: DC
  Administered 2016-05-17: 1 g via INTRAVENOUS
  Filled 2016-05-16: qty 10

## 2016-05-16 MED ORDER — FUROSEMIDE 10 MG/ML IJ SOLN
40.0000 mg | Freq: Once | INTRAMUSCULAR | Status: AC
  Administered 2016-05-16: 40 mg via INTRAVENOUS
  Filled 2016-05-16: qty 4

## 2016-05-16 MED ORDER — ASPIRIN EC 81 MG PO TBEC
81.0000 mg | DELAYED_RELEASE_TABLET | Freq: Every day | ORAL | Status: DC
  Administered 2016-05-16 – 2016-05-18 (×3): 81 mg via ORAL
  Filled 2016-05-16 (×3): qty 1

## 2016-05-16 MED ORDER — INSULIN ASPART 100 UNIT/ML ~~LOC~~ SOLN
0.0000 [IU] | Freq: Three times a day (TID) | SUBCUTANEOUS | Status: DC
  Administered 2016-05-17 (×2): 1 [IU] via SUBCUTANEOUS
  Administered 2016-05-18: 2 [IU] via SUBCUTANEOUS

## 2016-05-16 NOTE — Progress Notes (Signed)
ANTICOAGULATION CONSULT NOTE - Initial Consult  Pharmacy Consult for warfarin, Lovenox Indication: DVT  Allergies  Allergen Reactions  . Actos [Pioglitazone] Other (See Comments)    Slow HR and pain in face   . Anesthetics, Halogenated     Slept too long  . Crestor [Rosuvastatin Calcium]     Pain   . Sulfa Antibiotics Other (See Comments)    hallucinations  . Dye Fdc Red [Red Dye] Rash    # 40    Patient Measurements: Height: 5\' 7"  (170.2 cm) Weight: 210 lb 6 oz (95.4 kg) IBW/kg (Calculated) : 66.1  Vital Signs: Temp: 97.9 F (36.6 C) (11/03 1600) Temp Source: Oral (11/03 1600) BP: 131/77 (11/03 1602) Pulse Rate: 93 (11/03 1602)  Labs:  Recent Labs  05/16/16 1251 05/16/16 1322  HGB 13.9  --   HCT 40.6  --   PLT 156  --   LABPROT  --  21.5*  INR  --  1.84  CREATININE 1.30*  --   TROPONINI  --  0.11*    Estimated Creatinine Clearance: 50.7 mL/min (by C-G formula based on SCr of 1.3 mg/dL (H)).   Medical History: Past Medical History:  Diagnosis Date  . Congestive heart failure (CHF) ([place])   . Diabetes mellitus without complication (HCC)    no medications  . Diverticulosis   . DVT (deep venous thrombosis) ([place])   . Dyspnea   . Dysuria   . Heart disease   . Hyperlipemia   . Kidney stone   . Stroke ([place])   . TIA (transient ischemic attack) 2008   was seen on CT   . Toe infection     Medications:  Scheduled:  . aspirin  81 mg Oral QHS  . Cholecalciferol  1 capsule Oral BID  . Magnesium  1 tablet Oral BID   Infusions:  . [START ON 05/17/2016] azithromycin    . [START ON 05/17/2016] cefTRIAXone (ROCEPHIN)  IV      Assessment: 81 yo male presents to ER with cough and fever found to have CAP treating with Rocephin/Zithromax. Patient on warfarin PTA at a dose of 5mg  daily except 2.5mg  on Mondays for hx DVT. Last warfarin dose 11/2 at 2200. To continue warfarin per pharmacy dosing and start Lovenox for subtherapeutic INR. Baseline CBC good. SCr 1.3  with CrCl of 51 ml/min  Goal of Therapy:  INR 2-3 Anti-Xa level 0.6-1 units/ml 4hrs after LMWH dose given Monitor platelets by anticoagulation protocol: Yes   Plan:  1) Warfarin 5mg  tonight at 2000 2) Lovenox 1mg /kg SQ q12 until INR therapeutic 3) Daily INR and CBC   Adrian Saran, PharmD, BCPS Pager [phone] 05/16/2016 5:08 PM

## 2016-05-16 NOTE — Progress Notes (Signed)
CRITICAL VALUE ALERT  Critical value received:  Lactic Acid 2.6 Troponin 0.10  Date of notification:  05/16/16  Time of notification:  7:49  Critical value read back:Yes.    Nurse who received alert:  Tod Persia  MD notified (1st page):  Walden Field  Time of first page:  7:59  MD notified (2nd page):  Time of second page:  Responding MD:   Time MD responded:

## 2016-05-16 NOTE — Progress Notes (Signed)
Pt presented to my office c Fever, DOE, Cough, Hemoptysis and sats to 88% c exertion on RA. CXR confirmed Dense RML Infiltrate C/W PNA and Pl Effusion. Sent to ED for EVal and Treat and presumed admission.

## 2016-05-16 NOTE — ED Provider Notes (Signed)
[facility] DEPT Provider Note   CSN: [id number] Arrival date & time: 05/16/16  1216     History   Chief Complaint Chief Complaint  Patient presents with  . Cough  . Fever    HPI KONG BURKEEN is a 81 y.o. male.  HPI Patient sent from outpatient provider for admission for treatment of pneumonia. Patient was febrile in the office and chest x-ray identified right-sided consolidation. Patient reports that he's had about 4-5 days of cough which is occasionally blood-tinged. He has had increasing shortness of breath. Patient denies associated chest pain. He has had fatigue and myalgia. He reports he already had a flu shot this year. He denies calf pain or lower extremity swelling. No abdominal pain or vomiting or diarrhea. Patient reports he has had a history of prior DVT but no pulmonary embolus. Past Medical History:  Diagnosis Date  . Congestive heart failure (CHF) ([place])   . Diabetes mellitus without complication (HCC)    no medications  . Diverticulosis   . DVT (deep venous thrombosis) ([place])   . Dyspnea   . Dysuria   . Heart disease   . Hyperlipemia   . Kidney stone   . Stroke ([place])   . TIA (transient ischemic attack) 2008   was seen on CT   . Toe infection     Patient Active Problem List   Diagnosis Date Noted  . Sepsis ([place]) 05/16/2016  . CAP (community acquired pneumonia) 05/16/2016  . AKI (acute kidney injury) ([place]) 05/16/2016  . Pneumonia 05/16/2016  . Chronic systolic CHF (congestive heart failure) ([place]) 03/12/2015  . Elevated troponin   . Acute systolic CHF (congestive heart failure), NYHA class 3 ([place])   . NSTEMI (non-ST elevated myocardial infarction) ([place])   . Acute CHF ([place]) 11/28/2014  . Diabetes mellitus without complication ([place]) [id number]  . DVT (deep venous thrombosis) ([place])   . TIA (transient ischemic attack)   . Congestive heart disease [facility])     Past Surgical History:  Procedure Laterality Date  . CARDIAC CATHETERIZATION N/A 12/01/2014    Procedure: Right/Left Heart Cath and Coronary Angiography;  Surgeon: Jettie Booze, MD;  Location: [facility];  Service: Cardiovascular;  Laterality: N/A;  . COLONOSCOPY  2013  . ROTATOR CUFF REPAIR Right        Home Medications    Prior to Admission medications   Medication Sig Start Date End Date Taking? Authorizing Provider  aspirin 81 MG tablet Take 81 mg by mouth at bedtime.    Yes Historical Provider, MD  Cholecalciferol 2000 UNITS CAPS Take 1 capsule by mouth 2 (two) times daily.    Yes Historical Provider, MD  furosemide (LASIX) 20 MG tablet Take 1 tablet (20 mg total) by mouth daily as needed for fluid or edema. May use extra prn weight gain 01/01/15  Yes Burtis Junes, NP  GARLIC PO Take 1 tablet by mouth daily.   Yes Historical Provider, MD  Magnesium 250 MG TABS Take 1 tablet by mouth 2 (two) times daily.    Yes Historical Provider, MD  Methylsulfonylmethane (MSM) 1000 MG CAPS Take 1,000 mg by mouth daily.    Yes Historical Provider, MD  warfarin (COUMADIN) 5 MG tablet Take 2.5-5 mg by mouth at bedtime. Takes 5mg  everyday except on Monday's takes 2.5mg    Yes Historical Provider, MD    Family History Family History  Problem Relation Age of Onset  . Stroke Mother   . Hypertension Mother   .  Heart attack Father   . Deep vein thrombosis Father   . Crohn's disease Brother   . Schizophrenia Sister     Social History Social History  Substance Use Topics  . Smoking status: Never Smoker  . Smokeless tobacco: Never Used  . Alcohol use No     Allergies   Actos [pioglitazone]; Anesthetics, halogenated; Crestor [rosuvastatin calcium]; Sulfa antibiotics; and Dye fdc red [red dye]   Review of Systems Review of Systems 10 Systems reviewed and are negative for acute change except as noted in the HPI.   Physical Exam Updated Vital Signs BP 131/77   Pulse 93   Temp 97.9 F (36.6 C) (Oral)   Resp 24   Ht 5\' 7"  (1.702 m)   Wt 210 lb 6 oz (95.4 kg)    SpO2 95%   BMI 32.95 kg/m   Physical Exam  Constitutional: He is oriented to person, place, and time.  Patient is alert and nontoxic. Mild increased work of breathing at rest. Patient is mildly ill in appearance.  HENT:  Head: Normocephalic and atraumatic.  Mouth/Throat: Oropharynx is clear and moist.  Eyes: EOM are normal. Pupils are equal, round, and reactive to light.  Neck: Neck supple.  Cardiovascular:  Tachycardic, regular with occasional ectopy.  Pulmonary/Chest:  Patient has intermittent wet cough. Increased work of breathing with tachypnea. Taking a full sentences with clear mental status. Decreased breath sounds on the right with crackles in mid lung field to base.  Abdominal: Soft. He exhibits no distension. There is no tenderness.  Musculoskeletal: Normal range of motion. He exhibits no edema or tenderness.  Neurological: He is alert and oriented to person, place, and time. No cranial nerve deficit. He exhibits normal muscle tone. Coordination normal.  Skin: Skin is warm and dry.  Psychiatric: He has a normal mood and affect.     ED Treatments / Results  Labs (all labs ordered are listed, but only abnormal results are displayed) Labs Reviewed  CBC WITH DIFFERENTIAL/PLATELET - Abnormal; Notable for the following:       Result Value   WBC 19.0 (*)    Neutro Abs 16.0 (*)    Monocytes Absolute 1.7 (*)    All other components within normal limits  BASIC METABOLIC PANEL - Abnormal; Notable for the following:    Sodium 134 (*)    Glucose, Bld 194 (*)    BUN 26 (*)    Creatinine, Ser 1.30 (*)    Calcium 8.3 (*)    GFR calc non Af Amer 51 (*)    GFR calc Af Amer 59 (*)    All other components within normal limits  HEPATIC FUNCTION PANEL - Abnormal; Notable for the following:    AST 66 (*)    Total Bilirubin 2.1 (*)    Indirect Bilirubin 1.6 (*)    All other components within normal limits  BRAIN NATRIURETIC PEPTIDE - Abnormal; Notable for the following:    B  Natriuretic Peptide 1,280.2 (*)    All other components within normal limits  TROPONIN I - Abnormal; Notable for the following:    Troponin I 0.11 (*)    All other components within normal limits  PROTIME-INR - Abnormal; Notable for the following:    Prothrombin Time 21.5 (*)    All other components within normal limits  URINALYSIS, ROUTINE W REFLEX MICROSCOPIC (NOT AT [facility]) - Abnormal; Notable for the following:    Color, Urine AMBER (*)    Hgb urine dipstick TRACE (*)  Protein, ur 100 (*)    Leukocytes, UA TRACE (*)    All other components within normal limits  URINE MICROSCOPIC-ADD ON - Abnormal; Notable for the following:    Squamous Epithelial / LPF 0-5 (*)    All other components within normal limits  I-STAT CG4 LACTIC ACID, ED - Abnormal; Notable for the following:    Lactic Acid, Venous 2.66 (*)    All other components within normal limits  CULTURE, BLOOD (ROUTINE X 2)  CULTURE, BLOOD (ROUTINE X 2)  URINE CULTURE  CULTURE, BLOOD (ROUTINE X 2)  CULTURE, BLOOD (ROUTINE X 2)  CULTURE, EXPECTORATED SPUTUM-ASSESSMENT  GRAM STAIN  HIV ANTIBODY (ROUTINE TESTING)  STREP PNEUMONIAE URINARY ANTIGEN  I-STAT CG4 LACTIC ACID, ED  I-STAT CG4 LACTIC ACID, ED    EKG  EKG Interpretation  Date/Time:  Friday May 16 2016 14:13:41 EDT Ventricular Rate:  85 PR Interval:    QRS Duration: 105 QT Interval:  404 QTC Calculation: 481 R Axis:   33 Text Interpretation:  Sinus rhythm Atrial premature complex Borderline low voltage, extremity leads Nonspecific T abnrm, anterolateral leads Borderline prolonged QT interval Artifact in lead(s) I II III aVR aVL aVF V4 V5 V6 agree, no STEMI. SIMILAR to previous with dynamic anterior [place]. Confirmed by Johnney Killian, MD, Jeannie Done [phone]) on 05/16/2016 3:24:33 PM       Radiology Dg Chest 2 View  Result Date: 05/16/2016 CLINICAL DATA:  Chest pain and fever.  Hemoptysis. EXAM: CHEST  2 VIEW COMPARISON:  11/28/2014 FINDINGS: Dense right middle lobe  infiltrate, probable pneumonia. Left lung clear Cardiac enlargement with vascular congestion. Negative for edema. No pleural effusion IMPRESSION: Right middle lobe infiltrate consistent with pneumonia Cardiac enlargement with pulmonary vascular congestion. Negative for pulmonary edema. Electronically Signed   By: Franchot Gallo M.D.   On: 05/16/2016 15:55    Procedures Procedures (including critical care time) CRITICAL CARE Performed by: Charlesetta Shanks   Total critical care time: 30 minutes  Critical care time was exclusive of separately billable procedures and treating other patients.  Critical care was necessary to treat or prevent imminent or life-threatening deterioration.  Critical care was time spent personally by me on the following activities: development of treatment plan with patient and/or surrogate as well as nursing, discussions with consultants, evaluation of patient's response to treatment, examination of patient, obtaining history from patient or surrogate, ordering and performing treatments and interventions, ordering and review of laboratory studies, ordering and review of radiographic studies, pulse oximetry and re-evaluation of patient's condition. Medications Ordered in ED Medications  cefTRIAXone (ROCEPHIN) 1 g in dextrose 5 % 50 mL IVPB (not administered)  azithromycin (ZITHROMAX) 500 mg in dextrose 5 % 250 mL IVPB (not administered)  aspirin tablet 81 mg (not administered)  Magnesium TABS 250 mg (not administered)  Cholecalciferol CAPS 2,000 Units (not administered)  cefTRIAXone (ROCEPHIN) 1 g in dextrose 5 % 50 mL IVPB (0 g Intravenous Stopped 05/16/16 1431)  azithromycin (ZITHROMAX) 500 mg in dextrose 5 % 250 mL IVPB (500 mg Intravenous New Bag/Given 05/16/16 1423)  sodium chloride 0.9 % bolus 2,862 mL (0 mLs Intravenous Stopped 05/16/16 1601)  acetaminophen (TYLENOL) tablet 1,000 mg (1,000 mg Oral Given 05/16/16 1601)  ipratropium-albuterol (DUONEB) 0.5-2.5 (3)  MG/3ML nebulizer solution 3 mL (3 mLs Nebulization Given 05/16/16 1453)     Initial Impression / Assessment and Plan / ED Course  I have reviewed the triage vital signs and the nursing notes.  Pertinent labs & imaging results that were available  during my care of the patient were reviewed by me and considered in my medical decision making (see chart for details).  Clinical Course  Consult: Dr. Wynelle Cleveland for admission.  Final Clinical Impressions(s) / ED Diagnoses   Final diagnoses:  Community acquired pneumonia of right middle lobe of lung ([place])  Sepsis, due to unspecified organism [facility])   Patient sent for outpatient office for admission for pneumonia. Patient does have documented fever with lactic acidosis and identified pneumonia with leukocytosis. Sepsis protocol initiated. She remains alert and appropriate. He has increased work of breathing with tachypnea but no acute failure at this time. Patient will Be admitted for ongoing management and treatment. New Prescriptions New Prescriptions   No medications on file     Charlesetta Shanks, MD 05/16/16 1704

## 2016-05-16 NOTE — ED Notes (Signed)
Bed: [id number] Expected date:  Expected time:  Means of arrival:  Comments: Fever, pneumonia

## 2016-05-16 NOTE — H&P (Addendum)
History and Physical    Michael Randolph [id number] DOB: 04-26-1935 DOA: 05/16/2016    PCP: [name], MD  Patient coming from: home  Chief Complaint: fever, cough, shortness of breath, hemoptysis, weakness  HPI: Michael Randolph is a 81 y.o. male with medical history significant of chronic sCHF with EF of 30-35%, OSA, morbid obesity, HTN, DVT x 2 on chronic coumadin who presents with 3-4 days of cough, weakness, hemoptysis. He became worse and developed rapid breathing last night and by morning he was hot. Temp was 103. He went to see his PCP who checked a flu swab that was negative and a CXR which showed pneumonia and was sent to the ER.   ED Course: temp 100.8, pOx 94% on 2 l Sodium, 134, BUn 26, Cr 1.30, BNP 1280.2, TNI 0.11, Lactic acid 2.66, WBC 19   Review of Systems:  - lots of yellow discharge from both eyes, wipes it away and more comes out soon afterwards, + itching + easy bruising + chronic swelling of left leg -  had gained 3 lbs and took Lasix yesterday and day before All other systems reviewed and apart from HPI, are negative.  Past Medical History:  Diagnosis Date  . Congestive heart failure (CHF) ([place])   . Diabetes mellitus without complication (HCC)    no medications  . Diverticulosis   . DVT (deep venous thrombosis) ([place])   . Dyspnea   . Dysuria   . Heart disease   . Hyperlipemia   . Kidney stone   . Stroke ([place])   . TIA (transient ischemic attack) 2008   was seen on CT   . Toe infection     Past Surgical History:  Procedure Laterality Date  . CARDIAC CATHETERIZATION N/A 12/01/2014   Procedure: Right/Left Heart Cath and Coronary Angiography;  Surgeon: Jettie Booze, MD;  Location: [facility];  Service: Cardiovascular;  Laterality: N/A;  . COLONOSCOPY  2013  . ROTATOR CUFF REPAIR Right     Social History:   reports that he has never smoked. He has never used smokeless tobacco. He reports that he does not drink alcohol or use  drugs.  Lives with wife, able to perform ALDs  Allergies  Allergen Reactions  . Actos [Pioglitazone] Other (See Comments)    Slow HR and pain in face   . Anesthetics, Halogenated     Slept too long  . Crestor [Rosuvastatin Calcium]     Pain   . Sulfa Antibiotics Other (See Comments)    hallucinations  . Dye Fdc Red [Red Dye] Rash    # 84    Family History  Problem Relation Age of Onset  . Stroke Mother   . Hypertension Mother   . Heart attack Father   . Deep vein thrombosis Father   . Crohn's disease Brother   . Schizophrenia Sister      Prior to Admission medications   Medication Sig Start Date End Date Taking? Authorizing Provider  aspirin 81 MG tablet Take 81 mg by mouth at bedtime.    Yes Historical Provider, MD  Cholecalciferol 2000 UNITS CAPS Take 1 capsule by mouth 2 (two) times daily.    Yes Historical Provider, MD  furosemide (LASIX) 20 MG tablet Take 1 tablet (20 mg total) by mouth daily as needed for fluid or edema. May use extra prn weight gain 01/01/15  Yes Burtis Junes, NP  GARLIC PO Take 1 tablet by mouth daily.   Yes  Historical Provider, MD  Magnesium 250 MG TABS Take 1 tablet by mouth 2 (two) times daily.    Yes Historical Provider, MD  Methylsulfonylmethane (MSM) 1000 MG CAPS Take 1,000 mg by mouth daily.    Yes Historical Provider, MD  warfarin (COUMADIN) 5 MG tablet Take 2.5-5 mg by mouth at bedtime. Takes 5mg  everyday except on Monday's takes 2.5mg    Yes Historical Provider, MD    Physical Exam: Vitals:   05/16/16 1331 05/16/16 1453 05/16/16 1600 05/16/16 1602  BP: 128/80   131/77  Pulse: 85 88  93  Resp: 16 23  24   Temp:   97.9 F (36.6 C)   TempSrc:   Oral   SpO2: 94% 92%  95%  Weight:      Height:          Constitutional: NAD, calm, comfortable Eyes: PERTLA, lids and - prominent capillaries in conjunctivae   ENMT: Mucous membranes are moist. Posterior pharynx clear of any exudate or lesions. Normal dentition.  Neck: normal, supple,  no masses, no thyromegaly Respiratory: poor air entry RLL, RR in mid 20s,  no wheezing, no crackles.   No accessory muscle use.  Cardiovascular: S1 & S2 heard, regular rate and rhythm, no murmurs / rubs / gallops. Mild edema dorsum of L foot, 2+ pedal pulses. No carotid bruits.  Abdomen: No distension, no tenderness, no masses palpated. No hepatosplenomegaly. Bowel sounds normal.  Musculoskeletal: no clubbing / cyanosis. No joint deformity upper and lower extremities. Good ROM, no contractures. Normal muscle tone.  Skin: no rashes, lesions, ulcers. No induration Neurologic: CN 2-12 grossly intact. Sensation intact, DTR normal. Strength 5/5 in all 4 limbs.  Psychiatric: Normal judgment and insight. Alert and oriented x 3. Normal mood.     Labs on Admission: I have personally reviewed following labs and imaging studies  CBC:  Recent Labs Lab 05/16/16 1251  WBC 19.0*  NEUTROABS 16.0*  HGB 13.9  HCT 40.6  MCV 79.9  PLT [id number]   Basic Metabolic Panel:  Recent Labs Lab 05/16/16 1251  NA 134*  K 4.3  CL 101  CO2 22  GLUCOSE 194*  BUN 26*  CREATININE 1.30*  CALCIUM 8.3*   GFR: Estimated Creatinine Clearance: 50.7 mL/min (by C-G formula based on SCr of 1.3 mg/dL (H)). Liver Function Tests:  Recent Labs Lab 05/16/16 1322  AST 66*  ALT 53  ALKPHOS 52  BILITOT 2.1*  PROT 6.9  ALBUMIN 3.7   No results for input(s): LIPASE, AMYLASE in the last 168 hours. No results for input(s): AMMONIA in the last 168 hours. Coagulation Profile:  Recent Labs Lab 05/16/16 1322  INR 1.84   Cardiac Enzymes:  Recent Labs Lab 05/16/16 1322  TROPONINI 0.11*   BNP (last 3 results) No results for input(s): PROBNP in the last 8760 hours. HbA1C: No results for input(s): HGBA1C in the last 72 hours. CBG: No results for input(s): GLUCAP in the last 168 hours. Lipid Profile: No results for input(s): CHOL, HDL, LDLCALC, TRIG, CHOLHDL, LDLDIRECT in the last 72 hours. Thyroid Function  Tests: No results for input(s): TSH, T4TOTAL, FREET4, T3FREE, THYROIDAB in the last 72 hours. Anemia Panel: No results for input(s): VITAMINB12, FOLATE, FERRITIN, TIBC, IRON, RETICCTPCT in the last 72 hours. Urine analysis:    Component Value Date/Time   COLORURINE AMBER (A) 05/16/2016 1348   APPEARANCEUR CLEAR 05/16/2016 1348   LABSPEC 1.024 05/16/2016 1348   PHURINE 6.0 05/16/2016 1348   GLUCOSEU NEGATIVE 05/16/2016 1348   GLUCOSEU  NEGATIVE 01/01/2015 1130   HGBUR TRACE (A) 05/16/2016 1348   BILIRUBINUR NEGATIVE 05/16/2016 1348   KETONESUR NEGATIVE 05/16/2016 1348   PROTEINUR 100 (A) 05/16/2016 1348   UROBILINOGEN 0.2 01/01/2015 1130   NITRITE NEGATIVE 05/16/2016 1348   LEUKOCYTESUR TRACE (A) 05/16/2016 1348   Sepsis Labs: @LABRCNTIP (procalcitonin:4,lacticidven:4) )No results found for this or any previous visit (from the past 240 hour(s)).   Radiological Exams on Admission: Dg Chest 2 View  Result Date: 05/16/2016 CLINICAL DATA:  Chest pain and fever.  Hemoptysis. EXAM: CHEST  2 VIEW COMPARISON:  11/28/2014 FINDINGS: Dense right middle lobe infiltrate, probable pneumonia. Left lung clear Cardiac enlargement with vascular congestion. Negative for edema. No pleural effusion IMPRESSION: Right middle lobe infiltrate consistent with pneumonia Cardiac enlargement with pulmonary vascular congestion. Negative for pulmonary edema. Electronically Signed   By: Franchot Gallo M.D.   On: 05/16/2016 15:55    EKG: Independently reviewed. Sinus rhythm, chronically inverted T waves in V4,5,6  Assessment/Plan Principal Problem:   CAP (community acquired pneumonia)/ Sepsis - fever, leukocytosis - Rocephin and Zithromax - flu swab negative in office today-  check s. pneumo antigen, resp viral panel - has received ~ .2.5 L IVF in ER- will stop fluids for now - repeat CXR to decide if he needs Lasix  - repeat Lactic acid - ADDENDUM : CXR show pulmonary edema- will give 40 mg IV lasix- he is not  in distress-   Active Problems: Acute conjunctivitis - start Tobradex Q 4 hrs    Elevated troponin   Chronic systolic CHF   - follow Troponin- EKG unchanged from prior- on full dose anticoagulation - f/u CXR as mentioned above - Not on ACE/ ARB? - repeat ECHO as last one was about 1 yr ago    DVT (deep venous thrombosis) / subtherapeutic INR - 2 episodes in the past- on chronic anticoagulation - will start on Lovenox full dose until INR is therapeutic    Diabetes mellitus without complication - diet controlled- will order a low dose ISS   AKI (acute kidney injury)  - due to acute illness vs recent Lasix use - Cr 1.3- base line 1.0-1.1  Obesity Body mass index is 32.95 kg/m.   DVT prophylaxis: Lovenox  Code Status: Full code  Family Communication: wife  Disposition Plan: admit to telemetry  Consults called:   Admission status: inpatient    [facility] MD [facility] Pager: [email or web address] Password [email or web address] 7PM-7AM, please contact night-coverage   05/16/2016, 4:55 PM

## 2016-05-16 NOTE — ED Triage Notes (Signed)
Pt with productive cough x 5 days, fever x several days up to 103.4 at home this morning, worsening SOB. Pt states yellow sputum, at times blood tinged, pt states he was seen by MD this morning, CXR complete, results at bedside. Pt sats 94% on RA

## 2016-05-16 NOTE — ED Notes (Signed)
17:33 PT CAN GO TO FLOOR.

## 2016-05-17 ENCOUNTER — Inpatient Hospital Stay (HOSPITAL_COMMUNITY): Payer: Medicare Other

## 2016-05-17 DIAGNOSIS — I509 Heart failure, unspecified: Secondary | ICD-10-CM

## 2016-05-17 LAB — URINE CULTURE: Culture: NO GROWTH

## 2016-05-17 LAB — RESPIRATORY PANEL BY PCR
ADENOVIRUS-RVPPCR: NOT DETECTED
Bordetella pertussis: NOT DETECTED
CORONAVIRUS NL63-RVPPCR: NOT DETECTED
CORONAVIRUS OC43-RVPPCR: NOT DETECTED
Chlamydophila pneumoniae: NOT DETECTED
Coronavirus 229E: NOT DETECTED
Coronavirus HKU1: NOT DETECTED
INFLUENZA A-RVPPCR: NOT DETECTED
INFLUENZA B-RVPPCR: NOT DETECTED
METAPNEUMOVIRUS-RVPPCR: NOT DETECTED
MYCOPLASMA PNEUMONIAE-RVPPCR: NOT DETECTED
PARAINFLUENZA VIRUS 1-RVPPCR: NOT DETECTED
PARAINFLUENZA VIRUS 2-RVPPCR: NOT DETECTED
PARAINFLUENZA VIRUS 3-RVPPCR: NOT DETECTED
PARAINFLUENZA VIRUS 4-RVPPCR: NOT DETECTED
RESPIRATORY SYNCYTIAL VIRUS-RVPPCR: NOT DETECTED
RHINOVIRUS / ENTEROVIRUS - RVPPCR: NOT DETECTED

## 2016-05-17 LAB — BASIC METABOLIC PANEL
ANION GAP: 9 (ref 5–15)
BUN: 28 mg/dL — AB (ref 6–20)
CALCIUM: 7.8 mg/dL — AB (ref 8.9–10.3)
CO2: 25 mmol/L (ref 22–32)
Chloride: 100 mmol/L — ABNORMAL LOW (ref 101–111)
Creatinine, Ser: 1.23 mg/dL (ref 0.61–1.24)
GFR calc Af Amer: >60 mL/min (ref >60)
GFR, EST NON AFRICAN AMERICAN: 54 mL/min — AB (ref >60)
GLUCOSE: 125 mg/dL — AB (ref 65–99)
Potassium: 3.9 mmol/L (ref 3.5–5.1)
Sodium: 134 mmol/L — ABNORMAL LOW (ref 135–145)

## 2016-05-17 LAB — CBC
HEMATOCRIT: 35.5 % — AB (ref 39.0–52.0)
HEMOGLOBIN: 11.9 g/dL — AB (ref 13.0–17.0)
MCH: 27.2 pg (ref 26.0–34.0)
MCHC: 33.5 g/dL (ref 30.0–36.0)
MCV: 81.2 fL (ref 78.0–100.0)
Platelets: 142 10*3/uL — ABNORMAL LOW (ref 150–400)
RBC: 4.37 MIL/uL (ref 4.22–5.81)
RDW: 13.2 % (ref 11.5–15.5)
WBC: 12.9 10*3/uL — AB (ref 4.0–10.5)

## 2016-05-17 LAB — ECHOCARDIOGRAM COMPLETE
HEIGHTINCHES: 66 in
Weight: 3492.09 oz

## 2016-05-17 LAB — GLUCOSE, CAPILLARY
GLUCOSE-CAPILLARY: 117 mg/dL — AB (ref 65–99)
GLUCOSE-CAPILLARY: 134 mg/dL — AB (ref 65–99)
GLUCOSE-CAPILLARY: 125 mg/dL — AB (ref 65–99)
Glucose-Capillary: 144 mg/dL — ABNORMAL HIGH (ref 65–99)

## 2016-05-17 LAB — EXPECTORATED SPUTUM ASSESSMENT W REFEX TO RESP CULTURE

## 2016-05-17 LAB — EXPECTORATED SPUTUM ASSESSMENT W GRAM STAIN, RFLX TO RESP C

## 2016-05-17 LAB — PROTIME-INR
INR: 2.33
PROTHROMBIN TIME: 25.9 s — AB (ref 11.4–15.2)

## 2016-05-17 LAB — TROPONIN I: Troponin I: 0.1 ng/mL (ref <0.03)

## 2016-05-17 LAB — HIV ANTIBODY (ROUTINE TESTING W REFLEX): HIV SCREEN 4TH GENERATION: NONREACTIVE

## 2016-05-17 MED ORDER — LOSARTAN POTASSIUM 50 MG PO TABS
25.0000 mg | ORAL_TABLET | Freq: Every day | ORAL | Status: DC
  Administered 2016-05-18 – 2016-05-19 (×2): 25 mg via ORAL
  Filled 2016-05-17 (×2): qty 1

## 2016-05-17 MED ORDER — WARFARIN SODIUM 2.5 MG PO TABS
2.5000 mg | ORAL_TABLET | Freq: Once | ORAL | Status: AC
  Administered 2016-05-17: 2.5 mg via ORAL
  Filled 2016-05-17: qty 1

## 2016-05-17 MED ORDER — ACETAMINOPHEN 325 MG PO TABS
650.0000 mg | ORAL_TABLET | Freq: Four times a day (QID) | ORAL | Status: DC | PRN
  Administered 2016-05-17: 650 mg via ORAL
  Filled 2016-05-17: qty 2

## 2016-05-17 NOTE — Progress Notes (Signed)
PROGRESS NOTE    [name]  [id number] DOB: 1935-05-03 DOA: 05/16/2016  PCP: [name], MD   Brief Narrative:  Michael Randolph is a 81 y.o. male with medical history significant of chronic sCHF with EF of 30-35%, OSA, morbid obesity, HTN, DVT x 2 on chronic coumadin who presents with 3-4 days of cough, weakness, hemoptysis. He became worse and developed rapid breathing and by morning he was hot. Temp was 103. He went to see his PCP who checked a flu swab that was negative and a CXR which showed pneumonia and was sent to the ER.   Subjective: Cough improving. No further fever or chills. Less discharge from eyes.   Assessment & Plan:   Principal Problem:   CAP (community acquired pneumonia)/ Sepsis/ acute hypoxic respiratory failure - required 2 L O2 to keep pulse ox > 90 in ER - fever, leukocytosis - Rocephin and Zithromax - flu swab negative in office -  S. pneumo antigen & resp viral panel negative - overall improving- room air pulse ox now 94%  Active Problems: Acute conjunctivitis - improved with Tobradex Q 4 hrs    Elevated troponin   Chronic systolic CHF   - Troponin x 3 sets only minimally elevated - EKG unchanged from prior-already on full dose anticoagulation  Severe Systolic and diastolic CHF - repeated ECHO today - EF hs dropped from 30-35 % to 25-30 %, has grade 2 diastolic dysfunction - Not on ACE/ ARB? Will start low dose Cozaar tomorrow.     DVT (deep venous thrombosis) / subtherapeutic INR - 2 episodes in the past- on chronic anticoagulation - will start on Lovenox full dose until INR is therapeutic    Diabetes mellitus without complication - diet controlled-  low dose ISS   AKI (acute kidney injury)  - due to acute illness vs recent Lasix use - Cr 1.3- base line 1.0-1.1  Obesity Body mass index is 32.95 kg/m.   DVT prophylaxis: Coumadin Code Status: Full code Family Communication: wife Disposition Plan: home  Consultants:      Procedures:  2 D ECHO Global hypokinesis with akinesis of the inferior and   inferolateral walls; overall severely reduced LV systolic   function; grade 2 diastolic dysfunction; trace AI; mild MR;   severe LAE.  Antimicrobials:  Anti-infectives    Start     Dose/Rate Route Frequency Ordered Stop   05/17/16 1400  cefTRIAXone (ROCEPHIN) 1 g in dextrose 5 % 50 mL IVPB     1 g 100 mL/hr over 30 Minutes Intravenous Every 24 hours 05/16/16 1654 05/23/16 1359   05/17/16 1300  azithromycin (ZITHROMAX) 500 mg in dextrose 5 % 250 mL IVPB     500 mg 250 mL/hr over 60 Minutes Intravenous Every 24 hours 05/16/16 1654 05/23/16 1259   05/16/16 1330  cefTRIAXone (ROCEPHIN) 1 g in dextrose 5 % 50 mL IVPB     1 g 100 mL/hr over 30 Minutes Intravenous  Once 05/16/16 1319 05/16/16 1431   05/16/16 1330  azithromycin (ZITHROMAX) 500 mg in dextrose 5 % 250 mL IVPB     500 mg 250 mL/hr over 60 Minutes Intravenous  Once 05/16/16 1319 05/16/16 1749       Objective: Vitals:   05/16/16 1815 05/16/16 2046 05/17/16 0507 05/17/16 1508  BP: 114/65 123/76 (!) 110/49 114/72  Pulse: 84 86 65 85  Resp: (!) 21 20  20   Temp: 99.7 F (37.6 C) 99.6 F (37.6 C) 99.7 F (37.6  C) 100.1 F (37.8 C)  TempSrc: Oral Oral Oral Oral  SpO2: 94% 96% 96% 95%  Weight: 99 kg (218 lb 4.1 oz)     Height: 5\' 6"  (1.676 m)       Intake/Output Summary (Last 24 hours) at 05/17/16 1515 Last data filed at 05/17/16 1509  Gross per 24 hour  Intake              720 ml  Output              601 ml  Net              119 ml   Filed Weights   05/16/16 1234 05/16/16 1815  Weight: 95.4 kg (210 lb 6 oz) 99 kg (218 lb 4.1 oz)    Examination: General exam: Appears comfortable  HEENT: PERRLA, oral mucosa moist, no sclera icterus or thrush Respiratory system: Clear to auscultation. Respiratory effort normal. Cardiovascular system: S1 & S2 heard, RRR.  No murmurs  Gastrointestinal system: Abdomen soft, non-tender, nondistended.  Normal bowel sound. No organomegaly Central nervous system: Alert and oriented. No focal neurological deficits. Extremities: No cyanosis, clubbing or edema Skin: No rashes or ulcers Psychiatry:  Mood & affect appropriate.     Data Reviewed: I have personally reviewed following labs and imaging studies  CBC:  Recent Labs Lab 05/16/16 1251 05/17/16 0538  WBC 19.0* 12.9*  NEUTROABS 16.0*  --   HGB 13.9 11.9*  HCT 40.6 35.5*  MCV 79.9 81.2  PLT 156 [id number]*   Basic Metabolic Panel:  Recent Labs Lab 05/16/16 1251 05/17/16 0538  NA 134* 134*  K 4.3 3.9  CL 101 100*  CO2 22 25  GLUCOSE 194* 125*  BUN 26* 28*  CREATININE 1.30* 1.23  CALCIUM 8.3* 7.8*   GFR: Estimated Creatinine Clearance: 53.7 mL/min (by C-G formula based on SCr of 1.23 mg/dL). Liver Function Tests:  Recent Labs Lab 05/16/16 1322  AST 66*  ALT 53  ALKPHOS 52  BILITOT 2.1*  PROT 6.9  ALBUMIN 3.7   No results for input(s): LIPASE, AMYLASE in the last 168 hours. No results for input(s): AMMONIA in the last 168 hours. Coagulation Profile:  Recent Labs Lab 05/16/16 1322 05/17/16 0538  INR 1.84 2.33   Cardiac Enzymes:  Recent Labs Lab 05/16/16 1322 05/16/16 1841 05/16/16 2148 05/17/16 0538  TROPONINI 0.11* 0.10* 0.11* 0.10*   BNP (last 3 results) No results for input(s): PROBNP in the last 8760 hours. HbA1C: No results for input(s): HGBA1C in the last 72 hours. CBG:  Recent Labs Lab 05/16/16 2230 05/17/16 0733 05/17/16 1106  GLUCAP 161* 117* 134*   Lipid Profile: No results for input(s): CHOL, HDL, LDLCALC, TRIG, CHOLHDL, LDLDIRECT in the last 72 hours. Thyroid Function Tests: No results for input(s): TSH, T4TOTAL, FREET4, T3FREE, THYROIDAB in the last 72 hours. Anemia Panel: No results for input(s): VITAMINB12, FOLATE, FERRITIN, TIBC, IRON, RETICCTPCT in the last 72 hours. Urine analysis:    Component Value Date/Time   COLORURINE AMBER (A) 05/16/2016 1348   APPEARANCEUR  CLEAR 05/16/2016 1348   LABSPEC 1.024 05/16/2016 1348   PHURINE 6.0 05/16/2016 1348   GLUCOSEU NEGATIVE 05/16/2016 1348   GLUCOSEU NEGATIVE 01/01/2015 1130   HGBUR TRACE (A) 05/16/2016 1348   BILIRUBINUR NEGATIVE 05/16/2016 1348   KETONESUR NEGATIVE 05/16/2016 1348   PROTEINUR 100 (A) 05/16/2016 1348   UROBILINOGEN 0.2 01/01/2015 1130   NITRITE NEGATIVE 05/16/2016 1348   LEUKOCYTESUR TRACE (A) 05/16/2016 1348   Sepsis  Labs: @LABRCNTIP (procalcitonin:4,lacticidven:4) ) Recent Results (from the past 240 hour(s))  Culture, blood (routine x 2)     Status: None (Preliminary result)   Collection Time: 05/16/16  1:00 PM  Result Value Ref Range Status   Specimen Description BLOOD LEFT WRIST  Final   Special Requests BOTTLES DRAWN AEROBIC AND ANAEROBIC 5CC  Final   Culture   Final    NO GROWTH < 24 HOURS Performed at [facility]    Report Status PENDING  Incomplete  Culture, blood (routine x 2)     Status: None (Preliminary result)   Collection Time: 05/16/16  1:32 PM  Result Value Ref Range Status   Specimen Description BLOOD RIGHT ANTECUBITAL  Final   Special Requests BOTTLES DRAWN AEROBIC AND ANAEROBIC 5CC  Final   Culture   Final    NO GROWTH < 24 HOURS Performed at [facility]    Report Status PENDING  Incomplete  Urine culture     Status: None   Collection Time: 05/16/16  1:48 PM  Result Value Ref Range Status   Specimen Description URINE, RANDOM  Final   Special Requests NONE  Final   Culture NO GROWTH Performed at [facility]   Final   Report Status 05/17/2016 FINAL  Final  Respiratory Panel by PCR     Status: None   Collection Time: 05/16/16  6:52 PM  Result Value Ref Range Status   Adenovirus NOT DETECTED NOT DETECTED Final   Coronavirus 229E NOT DETECTED NOT DETECTED Final   Coronavirus HKU1 NOT DETECTED NOT DETECTED Final   Coronavirus NL63 NOT DETECTED NOT DETECTED Final   Coronavirus OC43 NOT DETECTED NOT DETECTED Final    Metapneumovirus NOT DETECTED NOT DETECTED Final   Rhinovirus / Enterovirus NOT DETECTED NOT DETECTED Final   Influenza A NOT DETECTED NOT DETECTED Final   Influenza B NOT DETECTED NOT DETECTED Final   Parainfluenza Virus 1 NOT DETECTED NOT DETECTED Final   Parainfluenza Virus 2 NOT DETECTED NOT DETECTED Final   Parainfluenza Virus 3 NOT DETECTED NOT DETECTED Final   Parainfluenza Virus 4 NOT DETECTED NOT DETECTED Final   Respiratory Syncytial Virus NOT DETECTED NOT DETECTED Final   Bordetella pertussis NOT DETECTED NOT DETECTED Final   Chlamydophila pneumoniae NOT DETECTED NOT DETECTED Final   Mycoplasma pneumoniae NOT DETECTED NOT DETECTED Final    Comment: Performed at [facility]  Culture, sputum-assessment     Status: None   Collection Time: 05/17/16  1:24 AM  Result Value Ref Range Status   Specimen Description SPU  Final   Special Requests NONE  Final   Sputum evaluation   Final    THIS SPECIMEN IS ACCEPTABLE. RESPIRATORY CULTURE REPORT TO FOLLOW.   Report Status 05/17/2016 FINAL  Final  Culture, respiratory (NON-Expectorated)     Status: None (Preliminary result)   Collection Time: 05/17/16  1:25 AM  Result Value Ref Range Status   Specimen Description SPUTUM  Final   Special Requests NONE  Final   Gram Stain   Final    NO WBC SEEN RARE SQUAMOUS EPITHELIAL CELLS PRESENT RARE GRAM POSITIVE COCCI IN PAIRS RARE GRAM VARIABLE ROD Performed at [facility]    Culture PENDING  Incomplete   Report Status PENDING  Incomplete         Radiology Studies: Dg Chest 2 View  Result Date: 05/16/2016 CLINICAL DATA:  Chest pain and fever.  Hemoptysis. EXAM: CHEST  2 VIEW COMPARISON:  11/28/2014 FINDINGS:  Dense right middle lobe infiltrate, probable pneumonia. Left lung clear Cardiac enlargement with vascular congestion. Negative for edema. No pleural effusion IMPRESSION: Right middle lobe infiltrate consistent with pneumonia Cardiac enlargement with pulmonary vascular  congestion. Negative for pulmonary edema. Electronically Signed   By: Franchot Gallo M.D.   On: 05/16/2016 15:55   Dg Chest Port 1 View  Result Date: 05/16/2016 CLINICAL DATA:  Recently diagnosed pneumonia. EXAM: PORTABLE CHEST 1 VIEW COMPARISON:  May 16, 2016 FINDINGS: Focal infiltrate remains in the right lung base, unchanged. New mild opacity in the left infrahilar region. No other changes. IMPRESSION: Persistent infiltrate in the right lung base and developing infiltrate in the left lung base. Recommend follow-up to resolution. Electronically Signed   By: Dorise Bullion III M.D   On: 05/16/2016 17:55      Scheduled Meds: . aspirin EC  81 mg Oral QHS  . azithromycin  500 mg Intravenous Q24H  . cefTRIAXone (ROCEPHIN)  IV  1 g Intravenous Q24H  . cholecalciferol  2,000 Units Oral BID  . insulin aspart  0-5 Units Subcutaneous QHS  . insulin aspart  0-9 Units Subcutaneous TID WC  . magnesium oxide  200 mg Oral BID  . tobramycin-dexamethasone   Both Eyes TID WC & HS  . warfarin  2.5 mg Oral ONCE-1800  . Warfarin - Pharmacist Dosing Inpatient   Does not apply q1800   Continuous Infusions:    LOS: 1 day    Time spent in minutes: 31    [place], MD [facility] Pager: [email or web address] Password [email or web address] 05/17/2016, 3:15 PM

## 2016-05-17 NOTE — Progress Notes (Signed)
SATURATION QUALIFICATIONS: (This note is used to comply with regulatory documentation for home oxygen)  Patient Saturations on Room Air at Rest = 96%  Patient Saturations on Room Air while Ambulating = 94%  Patient Saturations on 0 Liters of oxygen while Ambulating = 94%  Please briefly explain why patient needs home oxygen:

## 2016-05-17 NOTE — Progress Notes (Signed)
ANTICOAGULATION CONSULT NOTE - Initial Consult  Pharmacy Consult for warfarin, Lovenox Indication: DVT  Allergies  Allergen Reactions  . Actos [Pioglitazone] Other (See Comments)    Slow HR and pain in face   . Anesthetics, Halogenated     Slept too long  . Crestor [Rosuvastatin Calcium]     Pain   . Sulfa Antibiotics Other (See Comments)    hallucinations  . Dye Fdc Red [Red Dye] Rash    # 40    Patient Measurements: Height: 5\' 6"  (167.6 cm) Weight: 218 lb 4.1 oz (99 kg) IBW/kg (Calculated) : 63.8  Vital Signs: Temp: 99.7 F (37.6 C) (11/04 0507) Temp Source: Oral (11/04 0507) BP: 110/49 (11/04 0507) Pulse Rate: 65 (11/04 0507)  Labs:  Recent Labs  05/16/16 1251  05/16/16 1322 05/16/16 1841 05/16/16 2148 05/17/16 0538  HGB 13.9  --   --   --   --  11.9*  HCT 40.6  --   --   --   --  35.5*  PLT 156  --   --   --   --  142*  LABPROT  --   --  21.5*  --   --  25.9*  INR  --   --  1.84  --   --  2.33  CREATININE 1.30*  --   --   --   --  1.23  TROPONINI  --   < > 0.11* 0.10* 0.11* 0.10*  < > = values in this interval not displayed.  Estimated Creatinine Clearance: 53.7 mL/min (by C-G formula based on SCr of 1.23 mg/dL).   Medical History: Past Medical History:  Diagnosis Date  . Congestive heart failure (CHF) ([place])   . Diabetes mellitus without complication (HCC)    no medications  . Diverticulosis   . DVT (deep venous thrombosis) ([place])   . Dyspnea   . Dysuria   . Heart disease   . Hyperlipemia   . Kidney stone   . Stroke ([place])   . TIA (transient ischemic attack) 2008   was seen on CT   . Toe infection     Medications:  Scheduled:  . aspirin EC  81 mg Oral QHS  . azithromycin  500 mg Intravenous Q24H  . cefTRIAXone (ROCEPHIN)  IV  1 g Intravenous Q24H  . cholecalciferol  2,000 Units Oral BID  . enoxaparin (LOVENOX) injection  1 mg/kg Subcutaneous Q12H  . insulin aspart  0-5 Units Subcutaneous QHS  . insulin aspart  0-9 Units Subcutaneous TID  WC  . magnesium oxide  200 mg Oral BID  . tobramycin-dexamethasone   Both Eyes TID WC & HS  . Warfarin - Pharmacist Dosing Inpatient   Does not apply q1800   Infusions:     Assessment: 81 yo male presents to ER with cough and fever found to have CAP treating with Rocephin/Zithromax. Patient on warfarin PTA at a dose of 5mg  daily except 2.5mg  on Mondays for hx DVT. Last warfarin dose 11/2 at 2200. To continue warfarin per pharmacy dosing and start Lovenox for subtherapeutic INR.   Today, 05/17/2016: - INR now therapeutic at 2.33 (increased from 1.84 the day before) -  hgb down 11.9, plt down slightly - hemoptysis noted on admission.  Per RN, no hemoptysis noted this morning - heart healthy diet - no signif. Drug-drug intxns but on abx (can make pt more sensitive to warfarin  Goal of Therapy:  INR 2-3 Anti-Xa level 0.6-1 units/ml 4hrs after LMWH  dose given Monitor platelets by anticoagulation protocol: Yes     Plan:  - Warfarin 2.5 mg x1 today (d/t rapid rise in INR over night) - With therapeutic INR, will d/c lovenox  - monitor for s/s bleeding   Dia Sitter, PharmD, BCPS 05/17/2016 11:00 AM

## 2016-05-17 NOTE — Progress Notes (Signed)
  Echocardiogram 2D Echocardiogram has been performed.  Michael Randolph 05/17/2016, 9:54 AM

## 2016-05-18 DIAGNOSIS — A413 Sepsis due to Hemophilus influenzae: Principal | ICD-10-CM

## 2016-05-18 LAB — CBC
HCT: 37.1 % — ABNORMAL LOW (ref 39.0–52.0)
Hemoglobin: 12.3 g/dL — ABNORMAL LOW (ref 13.0–17.0)
MCH: 26.9 pg (ref 26.0–34.0)
MCHC: 33.2 g/dL (ref 30.0–36.0)
MCV: 81.2 fL (ref 78.0–100.0)
PLATELETS: 166 10*3/uL (ref 150–400)
RBC: 4.57 MIL/uL (ref 4.22–5.81)
RDW: 13.2 % (ref 11.5–15.5)
WBC: 11.2 10*3/uL — ABNORMAL HIGH (ref 4.0–10.5)

## 2016-05-18 LAB — BLOOD CULTURE ID PANEL (REFLEXED)
Acinetobacter baumannii: NOT DETECTED
CANDIDA ALBICANS: NOT DETECTED
CANDIDA KRUSEI: NOT DETECTED
CANDIDA PARAPSILOSIS: NOT DETECTED
CANDIDA TROPICALIS: NOT DETECTED
CARBAPENEM RESISTANCE: NOT DETECTED
Candida glabrata: NOT DETECTED
ENTEROBACTER CLOACAE COMPLEX: NOT DETECTED
ENTEROCOCCUS SPECIES: NOT DETECTED
Enterobacteriaceae species: NOT DETECTED
Escherichia coli: NOT DETECTED
Haemophilus influenzae: DETECTED — AB
KLEBSIELLA OXYTOCA: NOT DETECTED
KLEBSIELLA PNEUMONIAE: NOT DETECTED
Listeria monocytogenes: NOT DETECTED
Methicillin resistance: NOT DETECTED
Neisseria meningitidis: NOT DETECTED
PROTEUS SPECIES: NOT DETECTED
Pseudomonas aeruginosa: NOT DETECTED
STAPHYLOCOCCUS AUREUS BCID: NOT DETECTED
STREPTOCOCCUS PNEUMONIAE: NOT DETECTED
Serratia marcescens: NOT DETECTED
Staphylococcus species: NOT DETECTED
Streptococcus agalactiae: NOT DETECTED
Streptococcus pyogenes: NOT DETECTED
Streptococcus species: NOT DETECTED
VANCOMYCIN RESISTANCE: NOT DETECTED

## 2016-05-18 LAB — BASIC METABOLIC PANEL
Anion gap: 7 (ref 5–15)
BUN: 30 mg/dL — AB (ref 6–20)
CO2: 28 mmol/L (ref 22–32)
CREATININE: 1.16 mg/dL (ref 0.61–1.24)
Calcium: 8.4 mg/dL — ABNORMAL LOW (ref 8.9–10.3)
Chloride: 101 mmol/L (ref 101–111)
GFR calc Af Amer: >60 mL/min (ref >60)
GFR, EST NON AFRICAN AMERICAN: 58 mL/min — AB (ref >60)
Glucose, Bld: 115 mg/dL — ABNORMAL HIGH (ref 65–99)
Potassium: 4.2 mmol/L (ref 3.5–5.1)
SODIUM: 136 mmol/L (ref 135–145)

## 2016-05-18 LAB — GLUCOSE, CAPILLARY
GLUCOSE-CAPILLARY: 181 mg/dL — AB (ref 65–99)
GLUCOSE-CAPILLARY: 163 mg/dL — AB (ref 65–99)
Glucose-Capillary: 116 mg/dL — ABNORMAL HIGH (ref 65–99)
Glucose-Capillary: 170 mg/dL — ABNORMAL HIGH (ref 65–99)

## 2016-05-18 LAB — PROTIME-INR
INR: 2
PROTHROMBIN TIME: 22.9 s — AB (ref 11.4–15.2)

## 2016-05-18 LAB — MAGNESIUM: Magnesium: 2.1 mg/dL (ref 1.7–2.4)

## 2016-05-18 MED ORDER — WARFARIN SODIUM 2.5 MG PO TABS
5.0000 mg | ORAL_TABLET | Freq: Once | ORAL | Status: AC
Start: 2016-05-18 — End: 2016-05-18
  Administered 2016-05-18: 5 mg via ORAL
  Filled 2016-05-18: qty 2

## 2016-05-18 MED ORDER — DEXTROSE 5 % IV SOLN
2.0000 g | INTRAVENOUS | Status: DC
  Administered 2016-05-18: 2 g via INTRAVENOUS
  Filled 2016-05-18 (×2): qty 2

## 2016-05-18 MED ORDER — METOPROLOL TARTRATE 25 MG PO TABS
25.0000 mg | ORAL_TABLET | Freq: Two times a day (BID) | ORAL | Status: DC
  Administered 2016-05-18 – 2016-05-19 (×3): 25 mg via ORAL
  Filled 2016-05-18 (×3): qty 1

## 2016-05-18 MED ORDER — METOPROLOL TARTRATE 5 MG/5ML IV SOLN
2.5000 mg | Freq: Once | INTRAVENOUS | Status: AC
Start: 2016-05-18 — End: 2016-05-18
  Administered 2016-05-18: 2.5 mg via INTRAVENOUS
  Filled 2016-05-18: qty 5

## 2016-05-18 NOTE — Progress Notes (Signed)
ANTICOAGULATION CONSULT NOTE - Initial Consult  Pharmacy Consult for warfarin Indication: hx DVT  Allergies  Allergen Reactions  . Actos [Pioglitazone] Other (See Comments)    Slow HR and pain in face   . Anesthetics, Halogenated     Slept too long  . Crestor [Rosuvastatin Calcium]     Pain   . Sulfa Antibiotics Other (See Comments)    hallucinations  . Dye Fdc Red [Red Dye] Rash    # 40    Patient Measurements: Height: 5\' 6"  (167.6 cm) Weight: 218 lb 4.1 oz (99 kg) IBW/kg (Calculated) : 63.8  Vital Signs: Temp: 98.9 F (37.2 C) (11/05 0547) Temp Source: Oral (11/05 0547) BP: 136/70 (11/05 0547) Pulse Rate: 79 (11/05 0547)  Labs:  Recent Labs  05/16/16 1251  05/16/16 1322 05/16/16 1841 05/16/16 2148 05/17/16 0538 05/18/16 0538  HGB 13.9  --   --   --   --  11.9* 12.3*  HCT 40.6  --   --   --   --  35.5* 37.1*  PLT 156  --   --   --   --  142* 166  LABPROT  --   --  21.5*  --   --  25.9* 22.9*  INR  --   --  1.84  --   --  2.33 2.00  CREATININE 1.30*  --   --   --   --  1.23 1.16  TROPONINI  --   < > 0.11* 0.10* 0.11* 0.10*  --   < > = values in this interval not displayed.  Estimated Creatinine Clearance: 56.9 mL/min (by C-G formula based on SCr of 1.16 mg/dL).   Medical History: Past Medical History:  Diagnosis Date  . Congestive heart failure (CHF) ([place])   . Diabetes mellitus without complication (HCC)    no medications  . Diverticulosis   . DVT (deep venous thrombosis) ([place])   . Dyspnea   . Dysuria   . Heart disease   . Hyperlipemia   . Kidney stone   . Stroke ([place])   . TIA (transient ischemic attack) 2008   was seen on CT   . Toe infection     Medications:  Scheduled:  . aspirin EC  81 mg Oral QHS  . azithromycin  500 mg Intravenous Q24H  . cefTRIAXone (ROCEPHIN)  IV  2 g Intravenous Q24H  . cholecalciferol  2,000 Units Oral BID  . insulin aspart  0-5 Units Subcutaneous QHS  . insulin aspart  0-9 Units Subcutaneous TID WC  . losartan   25 mg Oral Daily  . magnesium oxide  200 mg Oral BID  . metoprolol tartrate  25 mg Oral BID  . tobramycin-dexamethasone   Both Eyes TID WC & HS  . Warfarin - Pharmacist Dosing Inpatient   Does not apply q1800   Infusions:    Assessment: 81 yo male presents to ER with cough and fever found to have CAP treating with Rocephin/Zithromax. Patient on warfarin PTA at a dose of 5mg  daily except 2.5mg  on Mondays for hx DVT. Last warfarin dose 11/2 at 2200. To continue warfarin per pharmacy dosing and start Lovenox for subtherapeutic INR.   Today, 05/18/2016: - INR is therapeutic at 2 (down from 2.33 after home dose 2.5 mg given yesterday) - cbc relatively stable - hemoptysis noted on admission.  No further hemoptysis documented - heart healthy diet - no signif. Drug-drug intxns but on abx (can make pt more sensitive to warfarin)  Goal of Therapy:  INR 2-3 Anti-Xa level 0.6-1 units/ml 4hrs after LMWH dose given Monitor platelets by anticoagulation protocol: Yes     Plan:  - Warfarin 5 mg x1 today  (red dye noted in allergy section; 5 mg tablets are "pink" -- will dispense two 2.5 mg tablets (green) to get 5mg  dose) - monitor for s/s bleeding   Dia Sitter, PharmD, BCPS 05/18/2016 9:21 AM

## 2016-05-18 NOTE — Progress Notes (Signed)
PHARMACY - PHYSICIAN COMMUNICATION CRITICAL VALUE ALERT - BLOOD CULTURE IDENTIFICATION (BCID)  Results for orders placed or performed during the hospital encounter of 05/16/16  Blood Culture ID Panel (Reflexed) (Collected: 05/16/2016  1:32 PM)  Result Value Ref Range   Enterococcus species NOT DETECTED NOT DETECTED   Vancomycin resistance NOT DETECTED NOT DETECTED   Listeria monocytogenes NOT DETECTED NOT DETECTED   Staphylococcus species NOT DETECTED NOT DETECTED   Staphylococcus aureus NOT DETECTED NOT DETECTED   Methicillin resistance NOT DETECTED NOT DETECTED   Streptococcus species NOT DETECTED NOT DETECTED   Streptococcus agalactiae NOT DETECTED NOT DETECTED   Streptococcus pneumoniae NOT DETECTED NOT DETECTED   Streptococcus pyogenes NOT DETECTED NOT DETECTED   Acinetobacter baumannii NOT DETECTED NOT DETECTED   Enterobacteriaceae species NOT DETECTED NOT DETECTED   Enterobacter cloacae complex NOT DETECTED NOT DETECTED   Escherichia coli NOT DETECTED NOT DETECTED   Klebsiella oxytoca NOT DETECTED NOT DETECTED   Klebsiella pneumoniae NOT DETECTED NOT DETECTED   Proteus species NOT DETECTED NOT DETECTED   Serratia marcescens NOT DETECTED NOT DETECTED   Carbapenem resistance NOT DETECTED NOT DETECTED   Haemophilus influenzae DETECTED (A) NOT DETECTED   Neisseria meningitidis NOT DETECTED NOT DETECTED   Pseudomonas aeruginosa NOT DETECTED NOT DETECTED   Candida albicans NOT DETECTED NOT DETECTED   Candida glabrata NOT DETECTED NOT DETECTED   Candida krusei NOT DETECTED NOT DETECTED   Candida parapsilosis NOT DETECTED NOT DETECTED   Candida tropicalis NOT DETECTED NOT DETECTED    Name of physician (or Provider) Contacted: Dr Tana Coast  Changes to prescribed antibiotics required: Increase Ceftriaxone to 2gm IV q24h  Everette Rank, PharmD 05/18/2016  4:50 AM

## 2016-05-18 NOTE — Progress Notes (Signed)
PROGRESS NOTE    Michael Randolph  [id number] DOB: July 28, 1934 DOA: 05/16/2016  PCP: [name], MD   Brief Narrative:  Michael Randolph is a 81 y.o. male with medical history significant of chronic sCHF with EF of 30-35%, OSA, morbid obesity, HTN, DVT x 2 on chronic coumadin who presents with 3-4 days of cough, weakness, hemoptysis. He became worse and developed rapid breathing and by morning he was hot. Temp was 103. He went to see his PCP who checked a flu swab that was negative and a CXR which showed pneumonia and was sent to the ER.   Subjective: Cough improving. No dyspnea and no discharge from eyes. Discussed his low EF with patient and wife. They mentioned that he was previously prescribed Lopressor and Lisinopril but these medications made him feel bad and so he stopped them on his own.   Assessment & Plan:   Principal Problem:   CAP (community acquired pneumonia)/ Sepsis/ acute hypoxic respiratory failure - required 2 L O2 to keep pulse ox > 90 in ER - fever, leukocytosis - started on Rocephin and Zithromax >> blood cultures growing H influenza- Rocephin increased to 2 gm- Zithromax stopped- repeat blood culture for clearance and await sensitivities - flu swab negative in office -  S. pneumo antigen & resp viral panel negative - overall improved- room air pulse ox now 98%  Active Problems: Acute conjunctivitis - improved with Tobradex Q 4 hrs    Elevated troponin   Chronic systolic CHF   - Troponin x 3 sets only minimally elevated - EKG unchanged from prior-already on full dose anticoagulation  Severe Systolic and diastolic CHF - repeated ECHO  - EF hs dropped from 30-35 % to 25-30 %, has grade 2 diastolic dysfunction - Not on ACE/ ARB? started low dose Cozaar at 25 mg and Lopressor at 12.5 mg BID  - can cont to hold diuretics for now  NSVT - 12 beat run today- starting Lopressor- electrolytes normal    DVT (deep venous thrombosis) / subtherapeutic INR - 2  episodes in the past- on chronic anticoagulation - given Lovenox full dose until INR therapeutic    Diabetes mellitus without complication - diet controlled-  low dose ISS   AKI (acute kidney injury)  - due to acute illness vs recent Lasix use - Cr 1.3- base line 1.0-1.1  Obesity Body mass index is 32.95 kg/m.   DVT prophylaxis: Coumadin Code Status: Full code Family Communication: wife Disposition Plan: home  Consultants:    Procedures:  2 D ECHO Global hypokinesis with akinesis of the inferior and   inferolateral walls; overall severely reduced LV systolic   function; grade 2 diastolic dysfunction; trace AI; mild MR;   severe LAE.  Antimicrobials:  Anti-infectives    Start     Dose/Rate Route Frequency Ordered Stop   05/18/16 1700  cefTRIAXone (ROCEPHIN) 2 g in dextrose 5 % 50 mL IVPB     2 g 100 mL/hr over 30 Minutes Intravenous Every 24 hours 05/18/16 0448     05/17/16 1400  cefTRIAXone (ROCEPHIN) 1 g in dextrose 5 % 50 mL IVPB  Status:  Discontinued     1 g 100 mL/hr over 30 Minutes Intravenous Every 24 hours 05/16/16 1654 05/18/16 0448   05/17/16 1300  azithromycin (ZITHROMAX) 500 mg in dextrose 5 % 250 mL IVPB     500 mg 250 mL/hr over 60 Minutes Intravenous Every 24 hours 05/16/16 1654 05/23/16 1259   05/16/16 1330  cefTRIAXone (ROCEPHIN) 1 g in dextrose 5 % 50 mL IVPB     1 g 100 mL/hr over 30 Minutes Intravenous  Once 05/16/16 1319 05/16/16 1431   05/16/16 1330  azithromycin (ZITHROMAX) 500 mg in dextrose 5 % 250 mL IVPB     500 mg 250 mL/hr over 60 Minutes Intravenous  Once 05/16/16 1319 05/16/16 1749       Objective: Vitals:   05/17/16 2102 05/17/16 2252 05/18/16 0020 05/18/16 0547  BP: 116/76   136/70  Pulse: 84   79  Resp: 18   18  Temp: (!) 101.1 F (38.4 C) 99.5 F (37.5 C) 98.8 F (37.1 C) 98.9 F (37.2 C)  TempSrc: Oral Oral Oral Oral  SpO2: 95%   98%  Weight:      Height:        Intake/Output Summary (Last 24 hours) at  05/18/16 1324 Last data filed at 05/18/16 0845  Gross per 24 hour  Intake             1020 ml  Output              301 ml  Net              719 ml   Filed Weights   05/16/16 1234 05/16/16 1815  Weight: 95.4 kg (210 lb 6 oz) 99 kg (218 lb 4.1 oz)    Examination: General exam: Appears comfortable  HEENT: PERRLA, oral mucosa moist, no sclera icterus or thrush Respiratory system: CTA b/l,  Respiratory effort normal. Cardiovascular system: S1 & S2 heard, RRR.  No murmurs  Gastrointestinal system: Abdomen soft, non-tender, nondistended. Normal bowel sound. No organomegaly Central nervous system: Alert and oriented. No focal neurological deficits. Extremities: No cyanosis, clubbing or edema Skin: No rashes or ulcers Psychiatry:  Mood & affect appropriate.     Data Reviewed: I have personally reviewed following labs and imaging studies  CBC:  Recent Labs Lab 05/16/16 1251 05/17/16 0538 05/18/16 0538  WBC 19.0* 12.9* 11.2*  NEUTROABS 16.0*  --   --   HGB 13.9 11.9* 12.3*  HCT 40.6 35.5* 37.1*  MCV 79.9 81.2 81.2  PLT 156 142* [id number]   Basic Metabolic Panel:  Recent Labs Lab 05/16/16 1251 05/17/16 0538 05/18/16 0538  NA 134* 134* 136  K 4.3 3.9 4.2  CL 101 100* 101  CO2 22 25 28   GLUCOSE 194* 125* 115*  BUN 26* 28* 30*  CREATININE 1.30* 1.23 1.16  CALCIUM 8.3* 7.8* 8.4*  MG  --   --  2.1   GFR: Estimated Creatinine Clearance: 56.9 mL/min (by C-G formula based on SCr of 1.16 mg/dL). Liver Function Tests:  Recent Labs Lab 05/16/16 1322  AST 66*  ALT 53  ALKPHOS 52  BILITOT 2.1*  PROT 6.9  ALBUMIN 3.7   No results for input(s): LIPASE, AMYLASE in the last 168 hours. No results for input(s): AMMONIA in the last 168 hours. Coagulation Profile:  Recent Labs Lab 05/16/16 1322 05/17/16 0538 05/18/16 0538  INR 1.84 2.33 2.00   Cardiac Enzymes:  Recent Labs Lab 05/16/16 1322 05/16/16 1841 05/16/16 2148 05/17/16 0538  TROPONINI 0.11* 0.10* 0.11*  0.10*   BNP (last 3 results) No results for input(s): PROBNP in the last 8760 hours. HbA1C: No results for input(s): HGBA1C in the last 72 hours. CBG:  Recent Labs Lab 05/17/16 1106 05/17/16 1652 05/17/16 2131 05/18/16 0757 05/18/16 1147  GLUCAP 134* 125* 144* 181* 163*   Lipid Profile:  No results for input(s): CHOL, HDL, LDLCALC, TRIG, CHOLHDL, LDLDIRECT in the last 72 hours. Thyroid Function Tests: No results for input(s): TSH, T4TOTAL, FREET4, T3FREE, THYROIDAB in the last 72 hours. Anemia Panel: No results for input(s): VITAMINB12, FOLATE, FERRITIN, TIBC, IRON, RETICCTPCT in the last 72 hours. Urine analysis:    Component Value Date/Time   COLORURINE AMBER (A) 05/16/2016 1348   APPEARANCEUR CLEAR 05/16/2016 1348   LABSPEC 1.024 05/16/2016 1348   PHURINE 6.0 05/16/2016 1348   GLUCOSEU NEGATIVE 05/16/2016 1348   GLUCOSEU NEGATIVE 01/01/2015 1130   HGBUR TRACE (A) 05/16/2016 1348   BILIRUBINUR NEGATIVE 05/16/2016 1348   KETONESUR NEGATIVE 05/16/2016 1348   PROTEINUR 100 (A) 05/16/2016 1348   UROBILINOGEN 0.2 01/01/2015 1130   NITRITE NEGATIVE 05/16/2016 1348   LEUKOCYTESUR TRACE (A) 05/16/2016 1348   Sepsis Labs: @LABRCNTIP (procalcitonin:4,lacticidven:4) ) Recent Results (from the past 240 hour(s))  Culture, blood (routine x 2)     Status: None (Preliminary result)   Collection Time: 05/16/16  1:00 PM  Result Value Ref Range Status   Specimen Description BLOOD LEFT WRIST  Final   Special Requests BOTTLES DRAWN AEROBIC AND ANAEROBIC 5CC  Final   Culture   Final    NO GROWTH 2 DAYS Performed at [facility]    Report Status PENDING  Incomplete  Culture, blood (routine x 2)     Status: None (Preliminary result)   Collection Time: 05/16/16  1:32 PM  Result Value Ref Range Status   Specimen Description BLOOD RIGHT ANTECUBITAL  Final   Special Requests BOTTLES DRAWN AEROBIC AND ANAEROBIC 5CC  Final   Culture  Setup Time   Final    GRAM NEGATIVE  RODS ANAEROBIC BOTTLE ONLY CRITICAL RESULT CALLED TO, READ BACK BY AND VERIFIED WITH: L.POINDEXTER,PHARMD [id number] 05/18/16 [email or web address]    Culture   Final    GRAM NEGATIVE RODS CULTURE REINCUBATED FOR BETTER GROWTH Performed at [facility]    Report Status PENDING  Incomplete  Blood Culture ID Panel (Reflexed)     Status: Abnormal   Collection Time: 05/16/16  1:32 PM  Result Value Ref Range Status   Enterococcus species NOT DETECTED NOT DETECTED Final   Vancomycin resistance NOT DETECTED NOT DETECTED Final   Listeria monocytogenes NOT DETECTED NOT DETECTED Final   Staphylococcus species NOT DETECTED NOT DETECTED Final   Staphylococcus aureus NOT DETECTED NOT DETECTED Final   Methicillin resistance NOT DETECTED NOT DETECTED Final   Streptococcus species NOT DETECTED NOT DETECTED Final   Streptococcus agalactiae NOT DETECTED NOT DETECTED Final   Streptococcus pneumoniae NOT DETECTED NOT DETECTED Final   Streptococcus pyogenes NOT DETECTED NOT DETECTED Final   Acinetobacter baumannii NOT DETECTED NOT DETECTED Final   Enterobacteriaceae species NOT DETECTED NOT DETECTED Final   Enterobacter cloacae complex NOT DETECTED NOT DETECTED Final   Escherichia coli NOT DETECTED NOT DETECTED Final   Klebsiella oxytoca NOT DETECTED NOT DETECTED Final   Klebsiella pneumoniae NOT DETECTED NOT DETECTED Final   Proteus species NOT DETECTED NOT DETECTED Final   Serratia marcescens NOT DETECTED NOT DETECTED Final   Carbapenem resistance NOT DETECTED NOT DETECTED Final   Haemophilus influenzae DETECTED (A) NOT DETECTED Final    Comment: CRITICAL RESULT CALLED TO, READ BACK BY AND VERIFIED WITH: L.POINDEXTER,PHARMD AT 0428 ON 05/18/16 BY G.MCADOO    Neisseria meningitidis NOT DETECTED NOT DETECTED Final   Pseudomonas aeruginosa NOT DETECTED NOT DETECTED Final   Candida albicans NOT DETECTED NOT DETECTED Final   Candida glabrata  NOT DETECTED NOT DETECTED Final   Candida krusei NOT DETECTED NOT  DETECTED Final   Candida parapsilosis NOT DETECTED NOT DETECTED Final   Candida tropicalis NOT DETECTED NOT DETECTED Final    Comment: Performed at [facility]  Urine culture     Status: None   Collection Time: 05/16/16  1:48 PM  Result Value Ref Range Status   Specimen Description URINE, RANDOM  Final   Special Requests NONE  Final   Culture NO GROWTH Performed at [facility]   Final   Report Status 05/17/2016 FINAL  Final  Respiratory Panel by PCR     Status: None   Collection Time: 05/16/16  6:52 PM  Result Value Ref Range Status   Adenovirus NOT DETECTED NOT DETECTED Final   Coronavirus 229E NOT DETECTED NOT DETECTED Final   Coronavirus HKU1 NOT DETECTED NOT DETECTED Final   Coronavirus NL63 NOT DETECTED NOT DETECTED Final   Coronavirus OC43 NOT DETECTED NOT DETECTED Final   Metapneumovirus NOT DETECTED NOT DETECTED Final   Rhinovirus / Enterovirus NOT DETECTED NOT DETECTED Final   Influenza A NOT DETECTED NOT DETECTED Final   Influenza B NOT DETECTED NOT DETECTED Final   Parainfluenza Virus 1 NOT DETECTED NOT DETECTED Final   Parainfluenza Virus 2 NOT DETECTED NOT DETECTED Final   Parainfluenza Virus 3 NOT DETECTED NOT DETECTED Final   Parainfluenza Virus 4 NOT DETECTED NOT DETECTED Final   Respiratory Syncytial Virus NOT DETECTED NOT DETECTED Final   Bordetella pertussis NOT DETECTED NOT DETECTED Final   Chlamydophila pneumoniae NOT DETECTED NOT DETECTED Final   Mycoplasma pneumoniae NOT DETECTED NOT DETECTED Final    Comment: Performed at [facility]  Culture, sputum-assessment     Status: None   Collection Time: 05/17/16  1:24 AM  Result Value Ref Range Status   Specimen Description SPU  Final   Special Requests NONE  Final   Sputum evaluation   Final    THIS SPECIMEN IS ACCEPTABLE. RESPIRATORY CULTURE REPORT TO FOLLOW.   Report Status 05/17/2016 FINAL  Final  Culture, respiratory (NON-Expectorated)     Status: None (Preliminary result)    Collection Time: 05/17/16  1:25 AM  Result Value Ref Range Status   Specimen Description SPUTUM  Final   Special Requests NONE  Final   Gram Stain   Final    NO WBC SEEN RARE SQUAMOUS EPITHELIAL CELLS PRESENT RARE GRAM POSITIVE COCCI IN PAIRS RARE GRAM VARIABLE ROD    Culture   Final    TOO YOUNG TO READ Performed at [facility]    Report Status PENDING  Incomplete         Radiology Studies: Dg Chest 2 View  Result Date: 05/16/2016 CLINICAL DATA:  Chest pain and fever.  Hemoptysis. EXAM: CHEST  2 VIEW COMPARISON:  11/28/2014 FINDINGS: Dense right middle lobe infiltrate, probable pneumonia. Left lung clear Cardiac enlargement with vascular congestion. Negative for edema. No pleural effusion IMPRESSION: Right middle lobe infiltrate consistent with pneumonia Cardiac enlargement with pulmonary vascular congestion. Negative for pulmonary edema. Electronically Signed   By: Franchot Gallo M.D.   On: 05/16/2016 15:55   Dg Chest Port 1 View  Result Date: 05/16/2016 CLINICAL DATA:  Recently diagnosed pneumonia. EXAM: PORTABLE CHEST 1 VIEW COMPARISON:  May 16, 2016 FINDINGS: Focal infiltrate remains in the right lung base, unchanged. New mild opacity in the left infrahilar region. No other changes. IMPRESSION: Persistent infiltrate in the right lung base and developing infiltrate  in the left lung base. Recommend follow-up to resolution. Electronically Signed   By: Dorise Bullion III M.D   On: 05/16/2016 17:55      Scheduled Meds: . aspirin EC  81 mg Oral QHS  . azithromycin  500 mg Intravenous Q24H  . cefTRIAXone (ROCEPHIN)  IV  2 g Intravenous Q24H  . cholecalciferol  2,000 Units Oral BID  . insulin aspart  0-5 Units Subcutaneous QHS  . insulin aspart  0-9 Units Subcutaneous TID WC  . losartan  25 mg Oral Daily  . magnesium oxide  200 mg Oral BID  . metoprolol tartrate  25 mg Oral BID  . tobramycin-dexamethasone   Both Eyes TID WC & HS  . warfarin  5 mg Oral ONCE-1800   . Warfarin - Pharmacist Dosing Inpatient   Does not apply q1800   Continuous Infusions:   LOS: 2 days    Time spent in minutes: 30    [place], MD [facility] Pager: [email or web address] Password [email or web address] 05/18/2016, 1:24 PM

## 2016-05-18 NOTE — Progress Notes (Signed)
Pt. Had 16 beat run of Vtach. Pt. Had no c/o of symptoms or SOB. MD notified. Will continue to monitor.

## 2016-05-19 DIAGNOSIS — I472 Ventricular tachycardia: Secondary | ICD-10-CM

## 2016-05-19 DIAGNOSIS — I5043 Acute on chronic combined systolic (congestive) and diastolic (congestive) heart failure: Secondary | ICD-10-CM

## 2016-05-19 DIAGNOSIS — I4729 Other ventricular tachycardia: Secondary | ICD-10-CM

## 2016-05-19 DIAGNOSIS — I5023 Acute on chronic systolic (congestive) heart failure: Secondary | ICD-10-CM

## 2016-05-19 DIAGNOSIS — I5042 Chronic combined systolic (congestive) and diastolic (congestive) heart failure: Secondary | ICD-10-CM

## 2016-05-19 LAB — CBC
HEMATOCRIT: 37.8 % — AB (ref 39.0–52.0)
Hemoglobin: 12.5 g/dL — ABNORMAL LOW (ref 13.0–17.0)
MCH: 26.9 pg (ref 26.0–34.0)
MCHC: 33.1 g/dL (ref 30.0–36.0)
MCV: 81.3 fL (ref 78.0–100.0)
Platelets: 198 10*3/uL (ref 150–400)
RBC: 4.65 MIL/uL (ref 4.22–5.81)
RDW: 13.4 % (ref 11.5–15.5)
WBC: 11.2 10*3/uL — AB (ref 4.0–10.5)

## 2016-05-19 LAB — CULTURE, BLOOD (ROUTINE X 2)

## 2016-05-19 LAB — GLUCOSE, CAPILLARY
Glucose-Capillary: 125 mg/dL — ABNORMAL HIGH (ref 65–99)
Glucose-Capillary: 139 mg/dL — ABNORMAL HIGH (ref 65–99)

## 2016-05-19 LAB — PROTIME-INR
INR: 2.03
Prothrombin Time: 23.3 seconds — ABNORMAL HIGH (ref 11.4–15.2)

## 2016-05-19 MED ORDER — METOPROLOL TARTRATE 25 MG PO TABS: 25.0000 mg | ORAL_TABLET | Freq: Two times a day (BID) | ORAL | 0 refills | Status: DC

## 2016-05-19 MED ORDER — WARFARIN SODIUM 2.5 MG PO TABS: 5.0000 mg | ORAL_TABLET | Freq: Once | ORAL | Status: DC

## 2016-05-19 MED ORDER — AMOXICILLIN 500 MG PO TABS: 1000.0000 mg | ORAL_TABLET | Freq: Two times a day (BID) | ORAL | 0 refills | Status: DC

## 2016-05-19 MED ORDER — LOSARTAN POTASSIUM 25 MG PO TABS: 25.0000 mg | ORAL_TABLET | Freq: Every day | ORAL | 0 refills | Status: DC

## 2016-05-19 MED ORDER — TOBRAMYCIN-DEXAMETHASONE 0.3-0.1 % OP OINT: TOPICAL_OINTMENT | Freq: Two times a day (BID) | OPHTHALMIC | 0 refills | Status: DC

## 2016-05-19 NOTE — Progress Notes (Signed)
Patient discharged home with wife, discharge instructions given and explained to patient/wife and they verbalized understanding, denies any pain/distress. No wound noted, skin intact. Accompanied home by wife, transported to the car by staff via wheelchair.

## 2016-05-19 NOTE — Progress Notes (Signed)
ANTICOAGULATION CONSULT NOTE - Follow-up Consult  Pharmacy Consult for warfarin Indication: hx DVT  Allergies  Allergen Reactions  . Actos [Pioglitazone] Other (See Comments)    Slow HR and pain in face   . Anesthetics, Halogenated     Slept too long  . Crestor [Rosuvastatin Calcium]     Pain   . Sulfa Antibiotics Other (See Comments)    hallucinations  . Dye Fdc Red [Red Dye] Rash    # 40    Patient Measurements: Height: 5\' 6"  (167.6 cm) Weight: 218 lb 1.6 oz (98.9 kg) IBW/kg (Calculated) : 63.8  Vital Signs: Temp: 98 F (36.7 C) (11/06 0700) Temp Source: Oral (11/06 0700) BP: 110/71 (11/06 0934) Pulse Rate: 74 (11/06 0934)  Labs:  Recent Labs  05/16/16 1251  05/16/16 1841 05/16/16 2148 05/17/16 0538 05/18/16 0538 05/19/16 0359  HGB 13.9  --   --   --  11.9* 12.3* 12.5*  HCT 40.6  --   --   --  35.5* 37.1* 37.8*  PLT 156  --   --   --  142* 166 198  LABPROT  --   < >  --   --  25.9* 22.9* 23.3*  INR  --   < >  --   --  2.33 2.00 2.03  CREATININE 1.30*  --   --   --  1.23 1.16  --   TROPONINI  --   < > 0.10* 0.11* 0.10*  --   --   < > = values in this interval not displayed.  Estimated Creatinine Clearance: 56.8 mL/min (by C-G formula based on SCr of 1.16 mg/dL).   Medications:  Scheduled:  . aspirin EC  81 mg Oral QHS  . azithromycin  500 mg Intravenous Q24H  . cefTRIAXone (ROCEPHIN)  IV  2 g Intravenous Q24H  . cholecalciferol  2,000 Units Oral BID  . insulin aspart  0-5 Units Subcutaneous QHS  . insulin aspart  0-9 Units Subcutaneous TID WC  . losartan  25 mg Oral Daily  . magnesium oxide  200 mg Oral BID  . metoprolol tartrate  25 mg Oral BID  . tobramycin-dexamethasone   Both Eyes TID WC & HS  . Warfarin - Pharmacist Dosing Inpatient   Does not apply q1800   Infusions:    Assessment: 81 yo male presents to ER with cough and fever found to have CAP treating with Rocephin/Zithromax. Patient on warfarin PTA at a dose of 5mg  daily except 2.5mg   on Mondays for hx DVT. Last warfarin dose 11/2 at 2200. To continue warfarin per pharmacy dosing and start Lovenox for subtherapeutic INR (LMWH stopped 11/4).   Today, 05/19/2016: - INR is therapeutic at 2.03 - CBC: Hgb and pltc stable - hemoptysis noted on admission.  No further hemoptysis documented - heart healthy diet - no significant Drug-Drug intxns but on abx (can make pt more sensitive to warfarin)  Goal of Therapy:  INR 2-3 Anti-Xa level 0.6-1 units/ml 4hrs after LMWH dose given Monitor platelets by anticoagulation protocol: Yes     Plan:  - Warfarin 5 mg x1 today  (red dye noted in allergy section; 5 mg tablets are "pink" -- will dispense two 2.5 mg tablets (green) to get 5mg  dose) - monitor for s/s bleeding   Doreene Eland, PharmD, BCPS.   Pager: [phone] 05/19/2016 10:45 AM

## 2016-05-19 NOTE — Discharge Summary (Signed)
Physician Discharge Summary  Michael Randolph [id number] DOB: 07-Oct-1934 DOA: 05/16/2016  PCP: Michael Reel, MD  Admit date: 05/16/2016 Discharge date: 05/19/2016  Admitted From: home Disposition:  home   Recommendations for Outpatient Follow-up:  1. F/u on EF, BP and NSVT 2. Repeat CXR in 2 wks 3. Consider changed Coumadin to NOAC   Discharge Condition:  stable   CODE STATUS:  Full code   Diet recommendation:  Heart healthy, low sodium, diabetic Consultations:  Phone consult with Dr Acie Fredrickson and IR    Discharge Diagnoses:  Principal Problem:   CAP (community acquired pneumonia) Active Problems:   Sepsis ([place])   AKI (acute kidney injury) ([place])   NSVT (nonsustained ventricular tachycardia) ([place])   Chronic combined systolic and diastolic congestive heart failure ([place])   DVT (deep venous thrombosis) ([place])   Diabetes mellitus without complication (HCC)   Elevated troponin    Subjective: No complaints today.   Brief Summary: Michael Ravens Pegramis a 81 y.o.malewith medical history significant of chronic sCHF with EF of 30-35%, OSA, morbid obesity, HTN, DVT x 2 on chronic coumadin who presents with 3-4 days of cough, weakness, hemoptysis. He became worse and developed rapid breathing and by morning he was hot. Temp was 103. He went to see his PCP who checked a flu swab that was negative and a CXR which showed pneumonia and was sent to the ER.   Hospital Course:  Principal Problem: H influenza CAP (community acquired pneumonia) and bacteremia / Sepsis/ acute hypoxic respiratory failure - required 2 L O2 to keep pulse ox > 90 in ER - fever, leukocytosis - started on Rocephin and Zithromax >> blood cultures growing H influenza- Rocephin increased to 2 gm- Zithromax stopped- repeat blood culture for clearance and await sensitivities - flu swab negative in office - S. pneumo antigen & resp viral panel negative - overall improved- room air pulse ox now 98%  Active  Problems: Acute conjunctivitis - improved with Tobradex Q 4 hrs  Elevated troponin  - Troponin x 3 sets only minimally elevated - EKG unchanged from prior-already on full dose anticoagulation  Severe Systolic and diastolic CHF - repeated ECHO >> EF has dropped from 30-35 % to 25-30 %, has grade 2 diastolic dysfunction- see report below - Noted that he stopped his Lopressor and Lisinopril on his own per note from Dr Acie Fredrickson in 2/17. He BP was too low to tolerate Aldactone - I started low dose Cozaar at 25 mg and Lopressor at 12.5 mg BID which he is torerating well - he take lasix 20 mg PRN - has not needed it as he has not been fluid overloaded  NSVT - short runs beats- longest was 16 beat once-  started Lopressor- electrolytes normal - per Dr Acie Fredrickson, if he can comply with medications, he might be considered for an ICD  DVT (deep venous thrombosis) / subtherapeutic INR - 2 episodes in the past- on chronic anticoagulation - given Lovenox full dose until INR therapeutic  Diabetes mellitus without complication - diet controlled-  low dose ISS  AKI (acute kidney injury)  - due to acute illness vs recent Lasix use - Cr 1.3- base line 1.0-1.1  Obesity Body mass index is 32.95 kg/m.    Discharge Instructions  Discharge Instructions    (HEART FAILURE PATIENTS) Call MD:  Anytime you have any of the following symptoms: 1) 3 pound weight gain in 24 hours or 5 pounds in 1 week 2) shortness of breath, with or without  a dry hacking cough 3) swelling in the hands, feet or stomach 4) if you have to sleep on extra pillows at night in order to breathe.    Complete by:  As directed    Call MD for:  difficulty breathing, headache or visual disturbances    Complete by:  As directed    Call MD for:  persistant dizziness or light-headedness    Complete by:  As directed    Call MD for:  temperature >100.4    Complete by:  As directed    Diet - low sodium heart healthy    Complete  by:  As directed    Discharge instructions    Complete by:  As directed    If you do not take your heart medications (Lopessor and Losartan), your heart will stop and you will die.   Increase activity slowly    Complete by:  As directed        Medication List    TAKE these medications   amoxicillin 500 MG tablet Commonly known as:  AMOXIL Take 2 tablets (1,000 mg total) by mouth 2 (two) times daily.   aspirin 81 MG tablet Take 81 mg by mouth at bedtime.   Cholecalciferol 2000 units Caps Take 1 capsule by mouth 2 (two) times daily.   furosemide 20 MG tablet Commonly known as:  LASIX Take 1 tablet (20 mg total) by mouth daily as needed for fluid or edema. May use extra prn weight gain   GARLIC PO Take 1 tablet by mouth daily.   losartan 25 MG tablet Commonly known as:  COZAAR Take 1 tablet (25 mg total) by mouth daily. Start taking on:  05/20/2016   Magnesium 250 MG Tabs Take 1 tablet by mouth 2 (two) times daily.   metoprolol tartrate 25 MG tablet Commonly known as:  LOPRESSOR Take 1 tablet (25 mg total) by mouth 2 (two) times daily.   MSM 1000 MG Caps Take 1,000 mg by mouth daily.   tobramycin-dexamethasone ophthalmic ointment Commonly known as:  TOBRADEX Place into both eyes 2 (two) times daily. Can take 2 x day for 2 days and then 1 x day for 2 days and then stop.   warfarin 5 MG tablet Commonly known as:  COUMADIN Take 2.5-5 mg by mouth at bedtime. Takes 5mg  everyday except on Monday's takes 2.5mg        Allergies  Allergen Reactions  . Actos [Pioglitazone] Other (See Comments)    Slow HR and pain in face   . Anesthetics, Halogenated     Slept too long  . Crestor [Rosuvastatin Calcium]     Pain   . Sulfa Antibiotics Other (See Comments)    hallucinations  . Dye Fdc Red [Red Dye] Rash    # 40     Procedures/Studies: 2 D ECHO Global hypokinesis with akinesis of the inferior and inferolateral walls; overall severely reduced LV  systolic function; grade 2 diastolic dysfunction; trace AI; mild MR; severe LAE.  Dg Chest 2 View  Result Date: 05/16/2016 CLINICAL DATA:  Chest pain and fever.  Hemoptysis. EXAM: CHEST  2 VIEW COMPARISON:  11/28/2014 FINDINGS: Dense right middle lobe infiltrate, probable pneumonia. Left lung clear Cardiac enlargement with vascular congestion. Negative for edema. No pleural effusion IMPRESSION: Right middle lobe infiltrate consistent with pneumonia Cardiac enlargement with pulmonary vascular congestion. Negative for pulmonary edema. Electronically Signed   By: Franchot Gallo M.D.   On: 05/16/2016 15:55   Dg Chest [facility]  Result Date: 05/16/2016 CLINICAL DATA:  Recently diagnosed pneumonia. EXAM: PORTABLE CHEST 1 VIEW COMPARISON:  May 16, 2016 FINDINGS: Focal infiltrate remains in the right lung base, unchanged. New mild opacity in the left infrahilar region. No other changes. IMPRESSION: Persistent infiltrate in the right lung base and developing infiltrate in the left lung base. Recommend follow-up to resolution. Electronically Signed   By: Dorise Bullion III M.D   On: 05/16/2016 17:55       Discharge Exam: Vitals:   05/19/16 1344 05/19/16 1400  BP: (!) 143/76 116/78  Pulse: 70 74  Resp:    Temp: 99.5 F (37.5 C)    Vitals:   05/19/16 0800 05/19/16 0934 05/19/16 1344 05/19/16 1400  BP:  110/71 (!) 143/76 116/78  Pulse:  74 70 74  Resp:      Temp:   99.5 F (37.5 C)   TempSrc:   Oral   SpO2:   96%   Weight: 98.9 kg (218 lb 1.6 oz)     Height:        General: Pt is alert, awake, not in acute distress Cardiovascular: RRR, S1/S2 +, no rubs, no gallops Respiratory: CTA bilaterally, no wheezing, no rhonchi Abdominal: Soft, NT, ND, bowel sounds + Extremities: no edema, no cyanosis    The results of significant diagnostics from this hospitalization (including imaging, microbiology, ancillary and laboratory) are listed below for reference.      Microbiology: Recent Results (from the past 240 hour(s))  Culture, blood (routine x 2)     Status: None (Preliminary result)   Collection Time: 05/16/16  1:00 PM  Result Value Ref Range Status   Specimen Description BLOOD LEFT WRIST  Final   Special Requests BOTTLES DRAWN AEROBIC AND ANAEROBIC 5CC  Final   Culture   Final    NO GROWTH 3 DAYS Performed at [facility]    Report Status PENDING  Incomplete  Culture, blood (routine x 2)     Status: Abnormal   Collection Time: 05/16/16  1:32 PM  Result Value Ref Range Status   Specimen Description BLOOD RIGHT ANTECUBITAL  Final   Special Requests BOTTLES DRAWN AEROBIC AND ANAEROBIC 5CC  Final   Culture  Setup Time   Final    GRAM NEGATIVE RODS ANAEROBIC BOTTLE ONLY CRITICAL RESULT CALLED TO, READ BACK BY AND VERIFIED WITH: L.POINDEXTER,PHARMD [id number] 05/18/16 G.MCADOO    Culture (A)  Final    HAEMOPHILUS INFLUENZAE BETA LACTAMASE NEGATIVE Performed at [facility]    Report Status 05/19/2016 FINAL  Final  Blood Culture ID Panel (Reflexed)     Status: Abnormal   Collection Time: 05/16/16  1:32 PM  Result Value Ref Range Status   Enterococcus species NOT DETECTED NOT DETECTED Final   Vancomycin resistance NOT DETECTED NOT DETECTED Final   Listeria monocytogenes NOT DETECTED NOT DETECTED Final   Staphylococcus species NOT DETECTED NOT DETECTED Final   Staphylococcus aureus NOT DETECTED NOT DETECTED Final   Methicillin resistance NOT DETECTED NOT DETECTED Final   Streptococcus species NOT DETECTED NOT DETECTED Final   Streptococcus agalactiae NOT DETECTED NOT DETECTED Final   Streptococcus pneumoniae NOT DETECTED NOT DETECTED Final   Streptococcus pyogenes NOT DETECTED NOT DETECTED Final   Acinetobacter baumannii NOT DETECTED NOT DETECTED Final   Enterobacteriaceae species NOT DETECTED NOT DETECTED Final   Enterobacter cloacae complex NOT DETECTED NOT DETECTED Final   Escherichia coli NOT DETECTED NOT DETECTED Final    Klebsiella oxytoca NOT DETECTED NOT DETECTED Final  Klebsiella pneumoniae NOT DETECTED NOT DETECTED Final   Proteus species NOT DETECTED NOT DETECTED Final   Serratia marcescens NOT DETECTED NOT DETECTED Final   Carbapenem resistance NOT DETECTED NOT DETECTED Final   Haemophilus influenzae DETECTED (A) NOT DETECTED Final    Comment: CRITICAL RESULT CALLED TO, READ BACK BY AND VERIFIED WITH: L.POINDEXTER,PHARMD AT [id number] ON 05/18/16 BY G.MCADOO    Neisseria meningitidis NOT DETECTED NOT DETECTED Final   Pseudomonas aeruginosa NOT DETECTED NOT DETECTED Final   Candida albicans NOT DETECTED NOT DETECTED Final   Candida glabrata NOT DETECTED NOT DETECTED Final   Candida krusei NOT DETECTED NOT DETECTED Final   Candida parapsilosis NOT DETECTED NOT DETECTED Final   Candida tropicalis NOT DETECTED NOT DETECTED Final    Comment: Performed at [facility]  Urine culture     Status: None   Collection Time: 05/16/16  1:48 PM  Result Value Ref Range Status   Specimen Description URINE, RANDOM  Final   Special Requests NONE  Final   Culture NO GROWTH Performed at [facility]   Final   Report Status 05/17/2016 FINAL  Final  Respiratory Panel by PCR     Status: None   Collection Time: 05/16/16  6:52 PM  Result Value Ref Range Status   Adenovirus NOT DETECTED NOT DETECTED Final   Coronavirus 229E NOT DETECTED NOT DETECTED Final   Coronavirus HKU1 NOT DETECTED NOT DETECTED Final   Coronavirus NL63 NOT DETECTED NOT DETECTED Final   Coronavirus OC43 NOT DETECTED NOT DETECTED Final   Metapneumovirus NOT DETECTED NOT DETECTED Final   Rhinovirus / Enterovirus NOT DETECTED NOT DETECTED Final   Influenza A NOT DETECTED NOT DETECTED Final   Influenza B NOT DETECTED NOT DETECTED Final   Parainfluenza Virus 1 NOT DETECTED NOT DETECTED Final   Parainfluenza Virus 2 NOT DETECTED NOT DETECTED Final   Parainfluenza Virus 3 NOT DETECTED NOT DETECTED Final   Parainfluenza Virus 4 NOT  DETECTED NOT DETECTED Final   Respiratory Syncytial Virus NOT DETECTED NOT DETECTED Final   Bordetella pertussis NOT DETECTED NOT DETECTED Final   Chlamydophila pneumoniae NOT DETECTED NOT DETECTED Final   Mycoplasma pneumoniae NOT DETECTED NOT DETECTED Final    Comment: Performed at [facility]  Culture, sputum-assessment     Status: None   Collection Time: 05/17/16  1:24 AM  Result Value Ref Range Status   Specimen Description SPU  Final   Special Requests NONE  Final   Sputum evaluation   Final    THIS SPECIMEN IS ACCEPTABLE. RESPIRATORY CULTURE REPORT TO FOLLOW.   Report Status 05/17/2016 FINAL  Final  Culture, respiratory (NON-Expectorated)     Status: None (Preliminary result)   Collection Time: 05/17/16  1:25 AM  Result Value Ref Range Status   Specimen Description SPUTUM  Final   Special Requests NONE  Final   Gram Stain   Final    NO WBC SEEN RARE SQUAMOUS EPITHELIAL CELLS PRESENT RARE GRAM POSITIVE COCCI IN PAIRS RARE GRAM VARIABLE ROD    Culture   Final    CULTURE REINCUBATED FOR BETTER GROWTH Performed at [facility]    Report Status PENDING  Incomplete  Culture, blood (Routine X 2) w Reflex to ID Panel     Status: None (Preliminary result)   Collection Time: 05/18/16  8:22 AM  Result Value Ref Range Status   Specimen Description BLOOD RIGHT ANTECUBITAL  Final   Special Requests IN PEDIATRIC BOTTLE [facility]  Final  Culture   Final    NO GROWTH < 24 HOURS Performed at [facility]    Report Status PENDING  Incomplete  Culture, blood (Routine X 2) w Reflex to ID Panel     Status: None (Preliminary result)   Collection Time: 05/18/16  8:22 AM  Result Value Ref Range Status   Specimen Description BLOOD RIGHT HAND  Final   Special Requests IN PEDIATRIC BOTTLE 2CC  Final   Culture   Final    NO GROWTH < 24 HOURS Performed at [facility]    Report Status PENDING  Incomplete     Labs: BNP (last 3 results)  Recent Labs   05/16/16 1322  BNP [id number]*   Basic Metabolic Panel:  Recent Labs Lab 05/16/16 1251 05/17/16 0538 05/18/16 0538  NA 134* 134* 136  K 4.3 3.9 4.2  CL 101 100* 101  CO2 22 25 28   GLUCOSE 194* 125* 115*  BUN 26* 28* 30*  CREATININE 1.30* 1.23 1.16  CALCIUM 8.3* 7.8* 8.4*  MG  --   --  2.1   Liver Function Tests:  Recent Labs Lab 05/16/16 1322  AST 66*  ALT 53  ALKPHOS 52  BILITOT 2.1*  PROT 6.9  ALBUMIN 3.7   No results for input(s): LIPASE, AMYLASE in the last 168 hours. No results for input(s): AMMONIA in the last 168 hours. CBC:  Recent Labs Lab 05/16/16 1251 05/17/16 0538 05/18/16 0538 05/19/16 0359  WBC 19.0* 12.9* 11.2* 11.2*  NEUTROABS 16.0*  --   --   --   HGB 13.9 11.9* 12.3* 12.5*  HCT 40.6 35.5* 37.1* 37.8*  MCV 79.9 81.2 81.2 81.3  PLT 156 142* 166 198   Cardiac Enzymes:  Recent Labs Lab 05/16/16 1322 05/16/16 1841 05/16/16 2148 05/17/16 0538  TROPONINI 0.11* 0.10* 0.11* 0.10*   BNP: Invalid input(s): POCBNP CBG:  Recent Labs Lab 05/18/16 1147 05/18/16 1730 05/18/16 2100 05/19/16 0719 05/19/16 1151  GLUCAP 163* 116* 170* 125* 139*   D-Dimer No results for input(s): DDIMER in the last 72 hours. Hgb A1c No results for input(s): HGBA1C in the last 72 hours. Lipid Profile No results for input(s): CHOL, HDL, LDLCALC, TRIG, CHOLHDL, LDLDIRECT in the last 72 hours. Thyroid function studies No results for input(s): TSH, T4TOTAL, T3FREE, THYROIDAB in the last 72 hours.  Invalid input(s): FREET3 Anemia work up No results for input(s): VITAMINB12, FOLATE, FERRITIN, TIBC, IRON, RETICCTPCT in the last 72 hours. Urinalysis    Component Value Date/Time   COLORURINE AMBER (A) 05/16/2016 1348   APPEARANCEUR CLEAR 05/16/2016 1348   LABSPEC 1.024 05/16/2016 1348   PHURINE 6.0 05/16/2016 1348   GLUCOSEU NEGATIVE 05/16/2016 1348   GLUCOSEU NEGATIVE 01/01/2015 1130   HGBUR TRACE (A) 05/16/2016 1348   BILIRUBINUR NEGATIVE 05/16/2016  1348   KETONESUR NEGATIVE 05/16/2016 1348   PROTEINUR 100 (A) 05/16/2016 1348   UROBILINOGEN 0.2 01/01/2015 1130   NITRITE NEGATIVE 05/16/2016 1348   LEUKOCYTESUR TRACE (A) 05/16/2016 1348   Sepsis Labs Invalid input(s): PROCALCITONIN,  WBC,  LACTICIDVEN Microbiology Recent Results (from the past 240 hour(s))  Culture, blood (routine x 2)     Status: None (Preliminary result)   Collection Time: 05/16/16  1:00 PM  Result Value Ref Range Status   Specimen Description BLOOD LEFT WRIST  Final   Special Requests BOTTLES DRAWN AEROBIC AND ANAEROBIC 5CC  Final   Culture   Final    NO GROWTH 3 DAYS Performed at [facility]  Report Status PENDING  Incomplete  Culture, blood (routine x 2)     Status: Abnormal   Collection Time: 05/16/16  1:32 PM  Result Value Ref Range Status   Specimen Description BLOOD RIGHT ANTECUBITAL  Final   Special Requests BOTTLES DRAWN AEROBIC AND ANAEROBIC 5CC  Final   Culture  Setup Time   Final    GRAM NEGATIVE RODS ANAEROBIC BOTTLE ONLY CRITICAL RESULT CALLED TO, READ BACK BY AND VERIFIED WITH: L.POINDEXTER,PHARMD [id number] 05/18/16 G.MCADOO    Culture (A)  Final    HAEMOPHILUS INFLUENZAE BETA LACTAMASE NEGATIVE Performed at [facility]    Report Status 05/19/2016 FINAL  Final  Blood Culture ID Panel (Reflexed)     Status: Abnormal   Collection Time: 05/16/16  1:32 PM  Result Value Ref Range Status   Enterococcus species NOT DETECTED NOT DETECTED Final   Vancomycin resistance NOT DETECTED NOT DETECTED Final   Listeria monocytogenes NOT DETECTED NOT DETECTED Final   Staphylococcus species NOT DETECTED NOT DETECTED Final   Staphylococcus aureus NOT DETECTED NOT DETECTED Final   Methicillin resistance NOT DETECTED NOT DETECTED Final   Streptococcus species NOT DETECTED NOT DETECTED Final   Streptococcus agalactiae NOT DETECTED NOT DETECTED Final   Streptococcus pneumoniae NOT DETECTED NOT DETECTED Final   Streptococcus pyogenes NOT  DETECTED NOT DETECTED Final   Acinetobacter baumannii NOT DETECTED NOT DETECTED Final   Enterobacteriaceae species NOT DETECTED NOT DETECTED Final   Enterobacter cloacae complex NOT DETECTED NOT DETECTED Final   Escherichia coli NOT DETECTED NOT DETECTED Final   Klebsiella oxytoca NOT DETECTED NOT DETECTED Final   Klebsiella pneumoniae NOT DETECTED NOT DETECTED Final   Proteus species NOT DETECTED NOT DETECTED Final   Serratia marcescens NOT DETECTED NOT DETECTED Final   Carbapenem resistance NOT DETECTED NOT DETECTED Final   Haemophilus influenzae DETECTED (A) NOT DETECTED Final    Comment: CRITICAL RESULT CALLED TO, READ BACK BY AND VERIFIED WITH: L.POINDEXTER,PHARMD AT [id number] ON 05/18/16 BY G.MCADOO    Neisseria meningitidis NOT DETECTED NOT DETECTED Final   Pseudomonas aeruginosa NOT DETECTED NOT DETECTED Final   Candida albicans NOT DETECTED NOT DETECTED Final   Candida glabrata NOT DETECTED NOT DETECTED Final   Candida krusei NOT DETECTED NOT DETECTED Final   Candida parapsilosis NOT DETECTED NOT DETECTED Final   Candida tropicalis NOT DETECTED NOT DETECTED Final    Comment: Performed at [facility]  Urine culture     Status: None   Collection Time: 05/16/16  1:48 PM  Result Value Ref Range Status   Specimen Description URINE, RANDOM  Final   Special Requests NONE  Final   Culture NO GROWTH Performed at [facility]   Final   Report Status 05/17/2016 FINAL  Final  Respiratory Panel by PCR     Status: None   Collection Time: 05/16/16  6:52 PM  Result Value Ref Range Status   Adenovirus NOT DETECTED NOT DETECTED Final   Coronavirus 229E NOT DETECTED NOT DETECTED Final   Coronavirus HKU1 NOT DETECTED NOT DETECTED Final   Coronavirus NL63 NOT DETECTED NOT DETECTED Final   Coronavirus OC43 NOT DETECTED NOT DETECTED Final   Metapneumovirus NOT DETECTED NOT DETECTED Final   Rhinovirus / Enterovirus NOT DETECTED NOT DETECTED Final   Influenza A NOT DETECTED NOT  DETECTED Final   Influenza B NOT DETECTED NOT DETECTED Final   Parainfluenza Virus 1 NOT DETECTED NOT DETECTED Final   Parainfluenza Virus 2 NOT DETECTED NOT DETECTED Final  Parainfluenza Virus 3 NOT DETECTED NOT DETECTED Final   Parainfluenza Virus 4 NOT DETECTED NOT DETECTED Final   Respiratory Syncytial Virus NOT DETECTED NOT DETECTED Final   Bordetella pertussis NOT DETECTED NOT DETECTED Final   Chlamydophila pneumoniae NOT DETECTED NOT DETECTED Final   Mycoplasma pneumoniae NOT DETECTED NOT DETECTED Final    Comment: Performed at [facility]  Culture, sputum-assessment     Status: None   Collection Time: 05/17/16  1:24 AM  Result Value Ref Range Status   Specimen Description SPU  Final   Special Requests NONE  Final   Sputum evaluation   Final    THIS SPECIMEN IS ACCEPTABLE. RESPIRATORY CULTURE REPORT TO FOLLOW.   Report Status 05/17/2016 FINAL  Final  Culture, respiratory (NON-Expectorated)     Status: None (Preliminary result)   Collection Time: 05/17/16  1:25 AM  Result Value Ref Range Status   Specimen Description SPUTUM  Final   Special Requests NONE  Final   Gram Stain   Final    NO WBC SEEN RARE SQUAMOUS EPITHELIAL CELLS PRESENT RARE GRAM POSITIVE COCCI IN PAIRS RARE GRAM VARIABLE ROD    Culture   Final    CULTURE REINCUBATED FOR BETTER GROWTH Performed at [facility]    Report Status PENDING  Incomplete  Culture, blood (Routine X 2) w Reflex to ID Panel     Status: None (Preliminary result)   Collection Time: 05/18/16  8:22 AM  Result Value Ref Range Status   Specimen Description BLOOD RIGHT ANTECUBITAL  Final   Special Requests IN PEDIATRIC BOTTLE [place]  Final   Culture   Final    NO GROWTH < 24 HOURS Performed at [facility]    Report Status PENDING  Incomplete  Culture, blood (Routine X 2) w Reflex to ID Panel     Status: None (Preliminary result)   Collection Time: 05/18/16  8:22 AM  Result Value Ref Range Status   Specimen  Description BLOOD RIGHT HAND  Final   Special Requests IN PEDIATRIC BOTTLE [place]  Final   Culture   Final    NO GROWTH < 24 HOURS Performed at [facility]    Report Status PENDING  Incomplete     Time coordinating discharge: Over 30 minutes  SIGNED:   Debbe Odea, MD  [facility] 05/19/2016, 3:36 PM Pager   If 7PM-7AM, please contact night-coverage [email or web address] Password [email or web address]

## 2016-05-20 LAB — CULTURE, RESPIRATORY: GRAM STAIN: NONE SEEN

## 2016-05-20 LAB — CULTURE, RESPIRATORY W GRAM STAIN

## 2016-05-21 LAB — CULTURE, BLOOD (ROUTINE X 2): Culture: NO GROWTH

## 2016-05-23 LAB — CULTURE, BLOOD (ROUTINE X 2)
CULTURE: NO GROWTH
Culture: NO GROWTH

## 2016-05-27 ENCOUNTER — Other Ambulatory Visit: Payer: Self-pay | Admitting: Family Medicine

## 2016-05-27 ENCOUNTER — Ambulatory Visit
Admission: RE | Admit: 2016-05-27 | Discharge: 2016-05-27 | Disposition: A | Discharge status: Home / Self Care | Payer: Medicare Other | Source: Ambulatory Visit | Attending: Family Medicine | Admitting: Family Medicine

## 2016-05-27 DIAGNOSIS — R918 Other nonspecific abnormal finding of lung field: Secondary | ICD-10-CM

## 2016-05-27 DIAGNOSIS — J181 Lobar pneumonia, unspecified organism: Secondary | ICD-10-CM | POA: Diagnosis not present

## 2016-05-27 DIAGNOSIS — J189 Pneumonia, unspecified organism: Secondary | ICD-10-CM | POA: Diagnosis not present

## 2016-05-27 DIAGNOSIS — Z7901 Long term (current) use of anticoagulants: Secondary | ICD-10-CM | POA: Diagnosis not present

## 2016-06-09 ENCOUNTER — Other Ambulatory Visit: Payer: Self-pay | Admitting: *Deleted

## 2016-06-09 NOTE — Telephone Encounter (Signed)
Left message for patient to call the office.  Will discuss recent vital signs and possibly schedule patient to see Dr. Acie Fredrickson sooner than 12/8.

## 2016-06-09 NOTE — Telephone Encounter (Signed)
Patients wife called and requested that Dr Acie Fredrickson refill these medications. Per wife, patient was put on these in the hospital recently. She says that the metoprolol was sent in incorrectly as the patient was instructed to take 12.5 mg bid but the rx has 25 mg bid. Okay to refill? Please advise. Thanks, MI

## 2016-06-11 DIAGNOSIS — I82403 Acute embolism and thrombosis of unspecified deep veins of lower extremity, bilateral: Secondary | ICD-10-CM | POA: Diagnosis not present

## 2016-06-11 DIAGNOSIS — Z7901 Long term (current) use of anticoagulants: Secondary | ICD-10-CM | POA: Diagnosis not present

## 2016-06-13 ENCOUNTER — Ambulatory Visit (INDEPENDENT_AMBULATORY_CARE_PROVIDER_SITE_OTHER): Payer: Medicare Other | Admitting: Cardiovascular Disease

## 2016-06-13 ENCOUNTER — Encounter: Payer: Self-pay | Admitting: Cardiovascular Disease

## 2016-06-13 VITALS — BP 120/70 | HR 50 | Ht 66.0 in | Wt 210.4 lb

## 2016-06-13 DIAGNOSIS — Z13220 Encounter for screening for lipoid disorders: Secondary | ICD-10-CM | POA: Diagnosis not present

## 2016-06-13 DIAGNOSIS — I5022 Chronic systolic (congestive) heart failure: Secondary | ICD-10-CM

## 2016-06-13 MED ORDER — METOPROLOL SUCCINATE ER 25 MG PO TB24: 12.5000 mg | ORAL_TABLET | Freq: Every day | ORAL | 3 refills | Status: DC

## 2016-06-13 MED ORDER — LOSARTAN POTASSIUM 25 MG PO TABS: 25.0000 mg | ORAL_TABLET | Freq: Every day | ORAL | 3 refills | Status: DC

## 2016-06-13 MED ORDER — FUROSEMIDE 20 MG PO TABS: 20.0000 mg | ORAL_TABLET | Freq: Every day | ORAL | 6 refills | Status: DC | PRN

## 2016-06-13 NOTE — Patient Instructions (Signed)
Medication Instructions:  STOP metoprolol tartrate (Lopressor) START metoprolol succinate (Toprol XL) 1/2 tab daily  Labwork: Your physician recommends that you return for lab work in: 6 months on the day of or a few days before your office visit with Dr. Acie Fredrickson.  You will need to FAST for this appointment - nothing to eat or drink after midnight the night before except water.    Testing/Procedures: None Ordered   Follow-Up: Your physician wants you to follow-up in: 6 months with Dr. Acie Fredrickson.  You will receive a reminder letter in the mail two months in advance. If you don't receive a letter, please call our office to schedule the follow-up appointment.   If you need a refill on your cardiac medications before your next appointment, please call your pharmacy.   Thank you for choosing [facility]! Christen Bame, RN [phone]

## 2016-06-13 NOTE — Progress Notes (Signed)
CARDIOLOGY OFFICE NOTE  Date:  06/13/2016    Michael Randolph Date of Birth: January 12, 1935 Medical Record #[id number]  PCP:  [name], MD  Cardiologist:  Nahser    Chief Complaint  Patient presents with  . Follow-up    CHF    History of Present Illness: Michael Randolph is a 81 y.o. male who presents today for a follow up visit. This is a 3 week check.  He has DM, prior DVT on Coumadin, prior TIA. He has HLD and he is reluctant to use statin due to history of myalgias and rash.  Presented back in May with dyspnea, chest pain and productive cough. Found to have combined systolic and diastolic HF. EF is 35 to 40%. Diuresed and started on beta blocker and ACE. Also with VT - had cardiac cath showing mild nonobstructive CAD with severe LV dysfunction - EF 20 to 25% and moderate pulmonary HTN.   I have seen him twice since discharge. He was improved clinically. Had a UTI which I treated. Have changed his Lasix to just prn and tried to increase ACE. BP is limiting how much titration we can do.   Comes back today. Here with his wife. Doing ok. BP cuff correlates. BP readings at home much lower - in the 34'H systolic. Breathing is good. No swelling. Weight is stable. Fleeting episode of dizziness. Going to [facility] to get a sleep work up.   March 12, 2015:   Mr. [name] was seen in the hospital in May. He has diastolic and systolic dysfunction. He had a cardiac catheterization which revealed minor coronary artery irregularities.  He was found to have a EF of 25-30% despite medical therapy.  It was recommended that he get an ICD/pacemaker. He's fairly adamant that he does not want a pacemaker at this time.  His heart rate and blood pressure had been quite low. He has discontinued the metoprolol and the lisinopril.  He's feeling quite a bit better than when I met him in the hospital in May. He's able to do all of his normal activities without severe chest pain or shortness  breath.   Nov. 18, 2016: Seems to be doing better.  Walks regularly .  Has been deer hunting .  Has stopped his Lisinopril because it made him feel poorly .  Brought his BP log.  Readings are all good.   105-120s   Feb. 27, 2017:  Doing ok Did not tolerate the Lisinopril - so he stopped it Did not tolerate the Aldactone  - stopped it   Dec. 1, 2017:  Doing ok.     Was in the hospital in Nov with right middle lobe  pneumonia .   blood cultures growing H influenza- Echo   In follow up for his chronic systolic CHF.    EF is now 25-30%.   Has grade 2 diastolic dysfunction   He's kept a blood pressure record. His blood pressures are arm on the low normal side. Heart rate is in the 50s to 60s.  Wanted to relay a long story about his hospitalization over a year ago .    Still feels dizzy.   HR has been slow   Past Medical History:  Diagnosis Date  . Congestive heart failure (CHF) ([place])   . Diabetes mellitus without complication (HCC)    no medications  . Diverticulosis   . DVT (deep venous thrombosis) ([place])   . Dyspnea   . Dysuria   .  Heart disease   . Hyperlipemia   . Kidney stone   . Stroke ([place])   . TIA (transient ischemic attack) 2008   was seen on CT   . Toe infection     Past Surgical History:  Procedure Laterality Date  . CARDIAC CATHETERIZATION N/A 12/01/2014   Procedure: Right/Left Heart Cath and Coronary Angiography;  Surgeon: Jettie Booze, MD;  Location: [facility];  Service: Cardiovascular;  Laterality: N/A;  . COLONOSCOPY  2013  . ROTATOR CUFF REPAIR Right      Medications: Current Outpatient Prescriptions  Medication Sig Dispense Refill  . aspirin 81 MG tablet Take 81 mg by mouth at bedtime.     . Cholecalciferol 2000 UNITS CAPS Take 1 capsule by mouth 2 (two) times daily.     . furosemide (LASIX) 20 MG tablet Take 1 tablet (20 mg total) by mouth daily as needed for fluid or edema. May use extra prn weight gain 60 tablet 6  . GARLIC PO  Take 1 tablet by mouth daily.    Marland Kitchen losartan (COZAAR) 25 MG tablet Take 1 tablet (25 mg total) by mouth daily. 30 tablet 0  . Magnesium 250 MG TABS Take 1 tablet by mouth 2 (two) times daily.     . Methylsulfonylmethane (MSM) 1000 MG CAPS Take 1,000 mg by mouth daily.     . metoprolol tartrate (LOPRESSOR) 25 MG tablet Take 1 tablet (25 mg total) by mouth 2 (two) times daily. 60 tablet 0  . warfarin (COUMADIN) 5 MG tablet Take 2.5-5 mg by mouth at bedtime. Takes 28m everyday except on Monday's takes 2.510m   . amoxicillin (AMOXIL) 500 MG tablet Take 2 tablets (1,000 mg total) by mouth 2 (two) times daily. (Patient not taking: Reported on 06/13/2016) 28 tablet 0  . tobramycin-dexamethasone (TOBRADEX) ophthalmic ointment Place into both eyes 2 (two) times daily. Can take 2 x day for 2 days and then 1 x day for 2 days and then stop. (Patient not taking: Reported on 06/13/2016) 3.5 g 0   No current facility-administered medications for this visit.     Allergies: Allergies  Allergen Reactions  . Actos [Pioglitazone] Other (See Comments)    Slow HR and pain in face   . Anesthetics, Halogenated     Slept too long  . Crestor [Rosuvastatin Calcium]     Pain   . Sulfa Antibiotics Other (See Comments)    hallucinations  . Dye Fdc Red [Red Dye] Rash    # 40    Social History: The patient  reports that he has never smoked. He has never used smokeless tobacco. He reports that he does not drink alcohol or use drugs.   Family History: The patient's family history includes Crohn's disease in his brother; Deep vein thrombosis in his father; Heart attack in his father; Hypertension in his mother; Schizophrenia in his sister; Stroke in his mother.   Review of Systems: Please see the history of present illness.   Otherwise, the review of systems is positive for none.   All other systems are reviewed and negative.   Physical Exam: VS:  BP 120/70 (BP Location: Right Arm, Patient Position: Sitting, Cuff  Size: Large)   Pulse (!) 50   Ht 5' 6"  (1.676 m)   Wt 210 lb 6.4 oz (95.4 kg)   BMI 33.96 kg/m  .  BMI Body mass index is 33.96 kg/m.  Wt Readings from Last 3 Encounters:  06/13/16 210 lb 6.4  oz (95.4 kg)  05/19/16 218 lb 1.6 oz (98.9 kg)  11/21/15 208 lb (94.3 kg)    General: Pleasant. Well developed, well nourished and in no acute distress.   HEENT: Normal.  Neck: Supple, no JVD, carotid bruits, or masses noted.  Cardiac: Regular rate and rhythm. No murmurs, rubs, or gallops. No edema.  Respiratory:  Lungs are clear to auscultation bilaterally with normal work of breathing.  GI: Soft and nontender.  MS: No deformity or atrophy. Gait and ROM intact.  Skin: Warm and dry. Color is normal.  Neuro:  Strength and sensation are intact and no gross focal deficits noted.  Psych: Alert, appropriate and with normal affect.   LABORATORY DATA:  EKG:  EKG is not ordered today.   Lab Results  Component Value Date   WBC 11.2 (H) 05/19/2016   HGB 12.5 (L) 05/19/2016   HCT 37.8 (L) 05/19/2016   PLT 198 05/19/2016   GLUCOSE 115 (H) 05/18/2016   CHOL 201 (H) 11/29/2014   TRIG 120 11/29/2014   HDL 30 (L) 11/29/2014   LDLCALC 147 (H) 11/29/2014   ALT 53 05/16/2016   AST 66 (H) 05/16/2016   NA 136 05/18/2016   K 4.2 05/18/2016   CL 101 05/18/2016   CREATININE 1.16 05/18/2016   BUN 30 (H) 05/18/2016   CO2 28 05/18/2016   TSH 4.347 11/29/2014   INR 2.03 05/19/2016   HGBA1C 6.1 (H) 11/29/2014    BNP (last 3 results)  Recent Labs  05/16/16 1322  BNP 1,280.2*    ProBNP (last 3 results) No results for input(s): PROBNP in the last 8760 hours.   Other Studies Reviewed Today:    Current medicines are reviewed with the patient today.  The patient does not have concerns regarding medicines other than what has been noted above.  The following changes have been made:  See above.  Labs/ tests ordered today include:    No orders of the defined types were placed in this  encounter.    Disposition:   FU with Dr. Acie Fredrickson as planned after echo.   Patient is agreeable to this plan and will call if any problems develop in the interim.   Assessment and plan 1. Chronic systolic congestive  heart failure:  It's been to have to keep him on standard medical regimen but he seems to be on the right medications at this time. His heart rate is a little slow and he has been feeling some dizziness. We will stop the metoprolol tartrate and start him on Toprol-XL 12.5 mg a day. We'll continue the losartan 25 mg a day. He takes Lasix on an as-needed basis.  I'll see him again in 6 months for follow-up visit. I anticipate repeating his echocardiogram around that time. He is not interested in an ICD.    2. Hyperlipidemia:  3. History of DVT: Continue Coumadin.   Mertie Moores, MD  06/13/2016 9:25 AM    [address],  [place], [place]  [address] Pager [phone] Phone: [phone]; Fax: [phone]

## 2016-06-16 DIAGNOSIS — J189 Pneumonia, unspecified organism: Secondary | ICD-10-CM | POA: Diagnosis not present

## 2016-06-16 DIAGNOSIS — R0901 Asphyxia: Secondary | ICD-10-CM | POA: Diagnosis not present

## 2016-06-16 DIAGNOSIS — I509 Heart failure, unspecified: Secondary | ICD-10-CM | POA: Diagnosis not present

## 2016-06-16 DIAGNOSIS — I82403 Acute embolism and thrombosis of unspecified deep veins of lower extremity, bilateral: Secondary | ICD-10-CM | POA: Diagnosis not present

## 2016-06-16 DIAGNOSIS — J181 Lobar pneumonia, unspecified organism: Secondary | ICD-10-CM | POA: Diagnosis not present

## 2016-06-16 DIAGNOSIS — E119 Type 2 diabetes mellitus without complications: Secondary | ICD-10-CM | POA: Diagnosis not present

## 2016-06-16 DIAGNOSIS — J14 Pneumonia due to Hemophilus influenzae: Secondary | ICD-10-CM | POA: Diagnosis not present

## 2016-06-16 DIAGNOSIS — Z6832 Body mass index (BMI) 32.0-32.9, adult: Secondary | ICD-10-CM | POA: Diagnosis not present

## 2016-06-16 DIAGNOSIS — N183 Chronic kidney disease, stage 3 (moderate): Secondary | ICD-10-CM | POA: Diagnosis not present

## 2016-06-16 DIAGNOSIS — I472 Ventricular tachycardia: Secondary | ICD-10-CM | POA: Diagnosis not present

## 2016-06-16 DIAGNOSIS — Z7901 Long term (current) use of anticoagulants: Secondary | ICD-10-CM | POA: Diagnosis not present

## 2016-06-16 DIAGNOSIS — J309 Allergic rhinitis, unspecified: Secondary | ICD-10-CM | POA: Diagnosis not present

## 2016-06-18 ENCOUNTER — Encounter: Payer: Self-pay | Admitting: Interventional Cardiology

## 2016-06-20 ENCOUNTER — Ambulatory Visit: Payer: Medicare Other | Admitting: Cardiovascular Disease

## 2016-06-27 DIAGNOSIS — J189 Pneumonia, unspecified organism: Secondary | ICD-10-CM | POA: Diagnosis not present

## 2016-06-27 DIAGNOSIS — E119 Type 2 diabetes mellitus without complications: Secondary | ICD-10-CM | POA: Diagnosis not present

## 2016-06-27 DIAGNOSIS — J181 Lobar pneumonia, unspecified organism: Secondary | ICD-10-CM | POA: Diagnosis not present

## 2016-06-27 DIAGNOSIS — N183 Chronic kidney disease, stage 3 (moderate): Secondary | ICD-10-CM | POA: Diagnosis not present

## 2016-06-27 DIAGNOSIS — I509 Heart failure, unspecified: Secondary | ICD-10-CM | POA: Diagnosis not present

## 2016-06-27 DIAGNOSIS — I2510 Atherosclerotic heart disease of native coronary artery without angina pectoris: Secondary | ICD-10-CM | POA: Diagnosis not present

## 2016-07-10 DIAGNOSIS — I82403 Acute embolism and thrombosis of unspecified deep veins of lower extremity, bilateral: Secondary | ICD-10-CM | POA: Diagnosis not present

## 2016-07-10 DIAGNOSIS — Z7901 Long term (current) use of anticoagulants: Secondary | ICD-10-CM | POA: Diagnosis not present

## 2016-08-07 DIAGNOSIS — I82403 Acute embolism and thrombosis of unspecified deep veins of lower extremity, bilateral: Secondary | ICD-10-CM | POA: Diagnosis not present

## 2016-08-07 DIAGNOSIS — Z7901 Long term (current) use of anticoagulants: Secondary | ICD-10-CM | POA: Diagnosis not present

## 2016-09-11 DIAGNOSIS — I82403 Acute embolism and thrombosis of unspecified deep veins of lower extremity, bilateral: Secondary | ICD-10-CM | POA: Diagnosis not present

## 2016-09-11 DIAGNOSIS — Z86718 Personal history of other venous thrombosis and embolism: Secondary | ICD-10-CM | POA: Diagnosis not present

## 2016-09-11 DIAGNOSIS — Z7901 Long term (current) use of anticoagulants: Secondary | ICD-10-CM | POA: Diagnosis not present

## 2016-09-23 DIAGNOSIS — E119 Type 2 diabetes mellitus without complications: Secondary | ICD-10-CM | POA: Diagnosis not present

## 2016-09-23 DIAGNOSIS — I2789 Other specified pulmonary heart diseases: Secondary | ICD-10-CM | POA: Diagnosis not present

## 2016-09-23 DIAGNOSIS — I472 Ventricular tachycardia: Secondary | ICD-10-CM | POA: Diagnosis not present

## 2016-09-23 DIAGNOSIS — Z23 Encounter for immunization: Secondary | ICD-10-CM | POA: Diagnosis not present

## 2016-09-23 DIAGNOSIS — Z6833 Body mass index (BMI) 33.0-33.9, adult: Secondary | ICD-10-CM | POA: Diagnosis not present

## 2016-09-23 DIAGNOSIS — N183 Chronic kidney disease, stage 3 (moderate): Secondary | ICD-10-CM | POA: Diagnosis not present

## 2016-09-23 DIAGNOSIS — Z7901 Long term (current) use of anticoagulants: Secondary | ICD-10-CM | POA: Diagnosis not present

## 2016-09-23 DIAGNOSIS — I2510 Atherosclerotic heart disease of native coronary artery without angina pectoris: Secondary | ICD-10-CM | POA: Diagnosis not present

## 2016-09-23 DIAGNOSIS — R008 Other abnormalities of heart beat: Secondary | ICD-10-CM | POA: Diagnosis not present

## 2016-09-23 DIAGNOSIS — Z86718 Personal history of other venous thrombosis and embolism: Secondary | ICD-10-CM | POA: Diagnosis not present

## 2016-09-23 DIAGNOSIS — I509 Heart failure, unspecified: Secondary | ICD-10-CM | POA: Diagnosis not present

## 2016-09-23 DIAGNOSIS — D698 Other specified hemorrhagic conditions: Secondary | ICD-10-CM | POA: Diagnosis not present

## 2016-10-11 IMAGING — CR DG CHEST 2V
2 series · 2 of 2 positions shown · non-contrast
Comparison: None

CLINICAL DATA: LEFT side chest pain and shortness of breath for 10
days, history CHF, hypertension, diabetes

EXAM:
CHEST  2 VIEW

[chest pa]
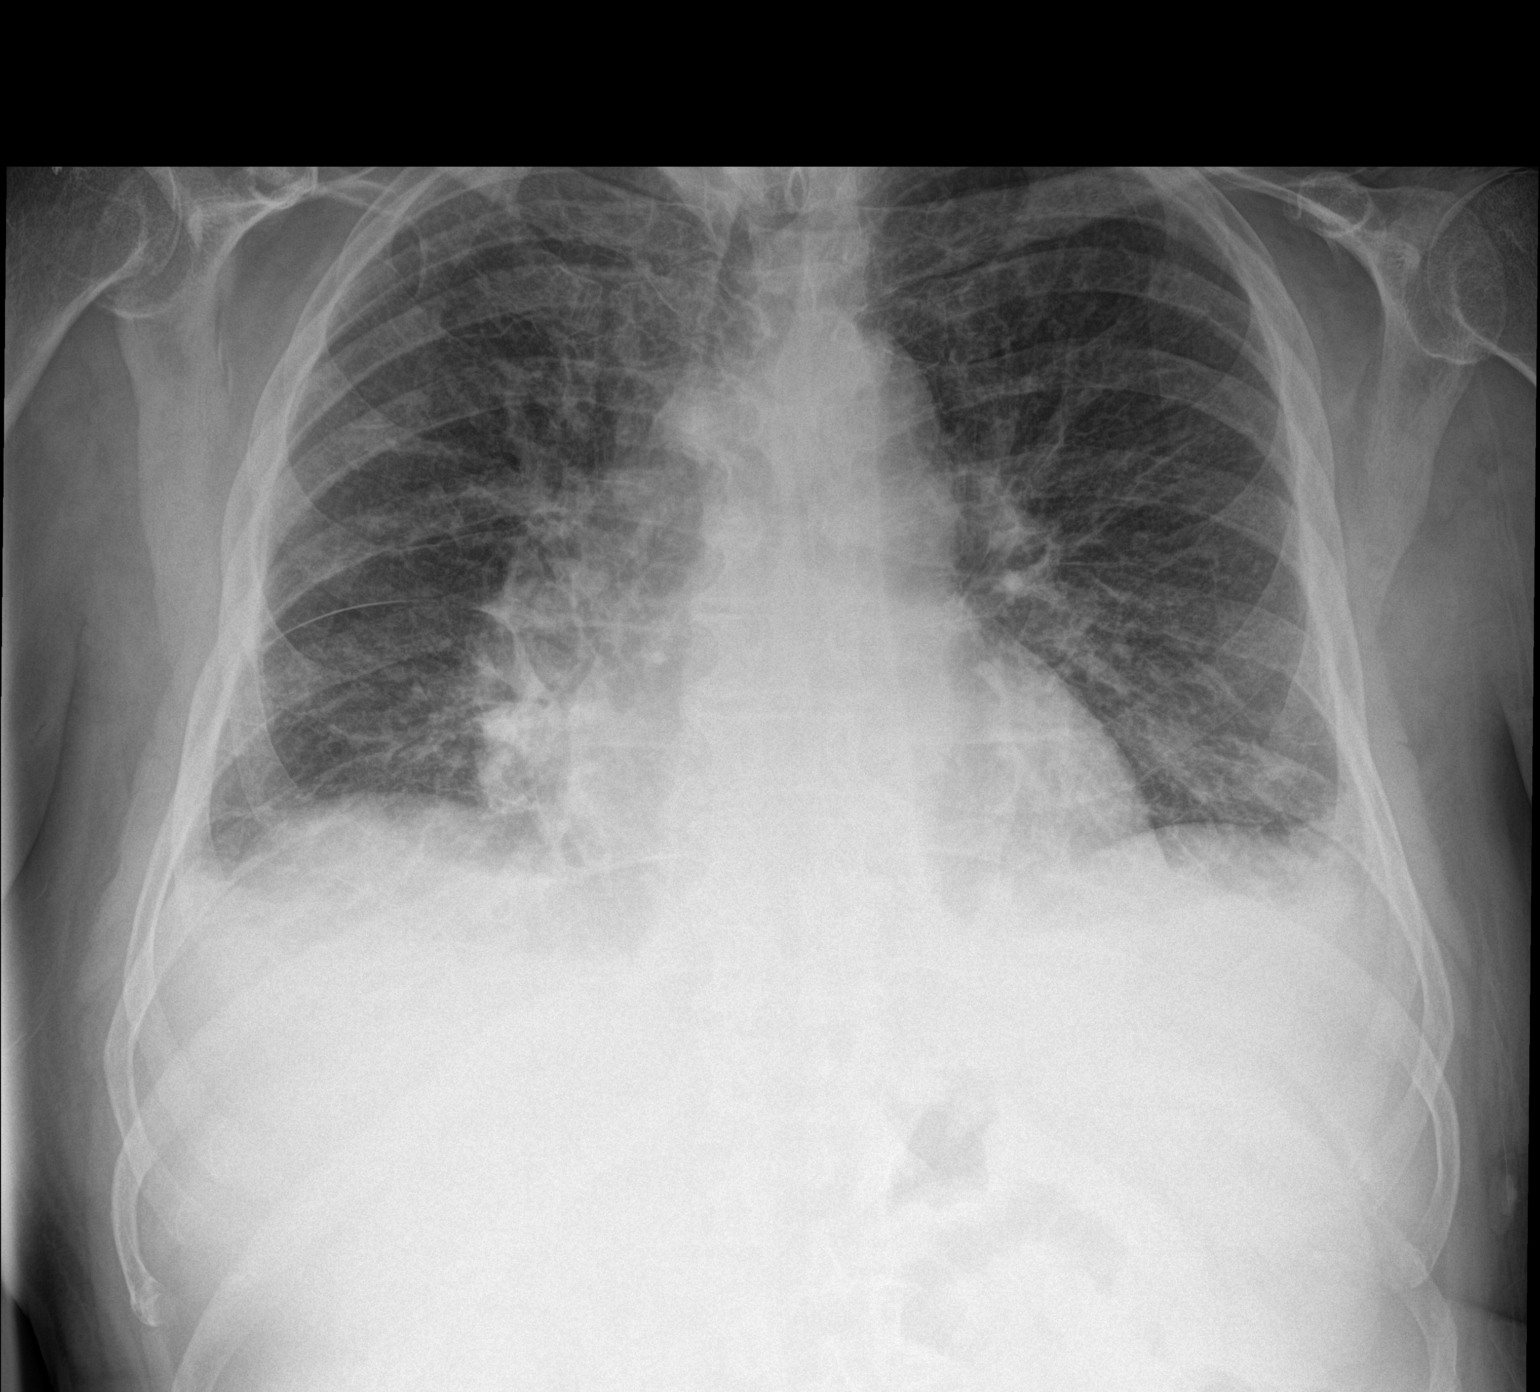

[chest lat]
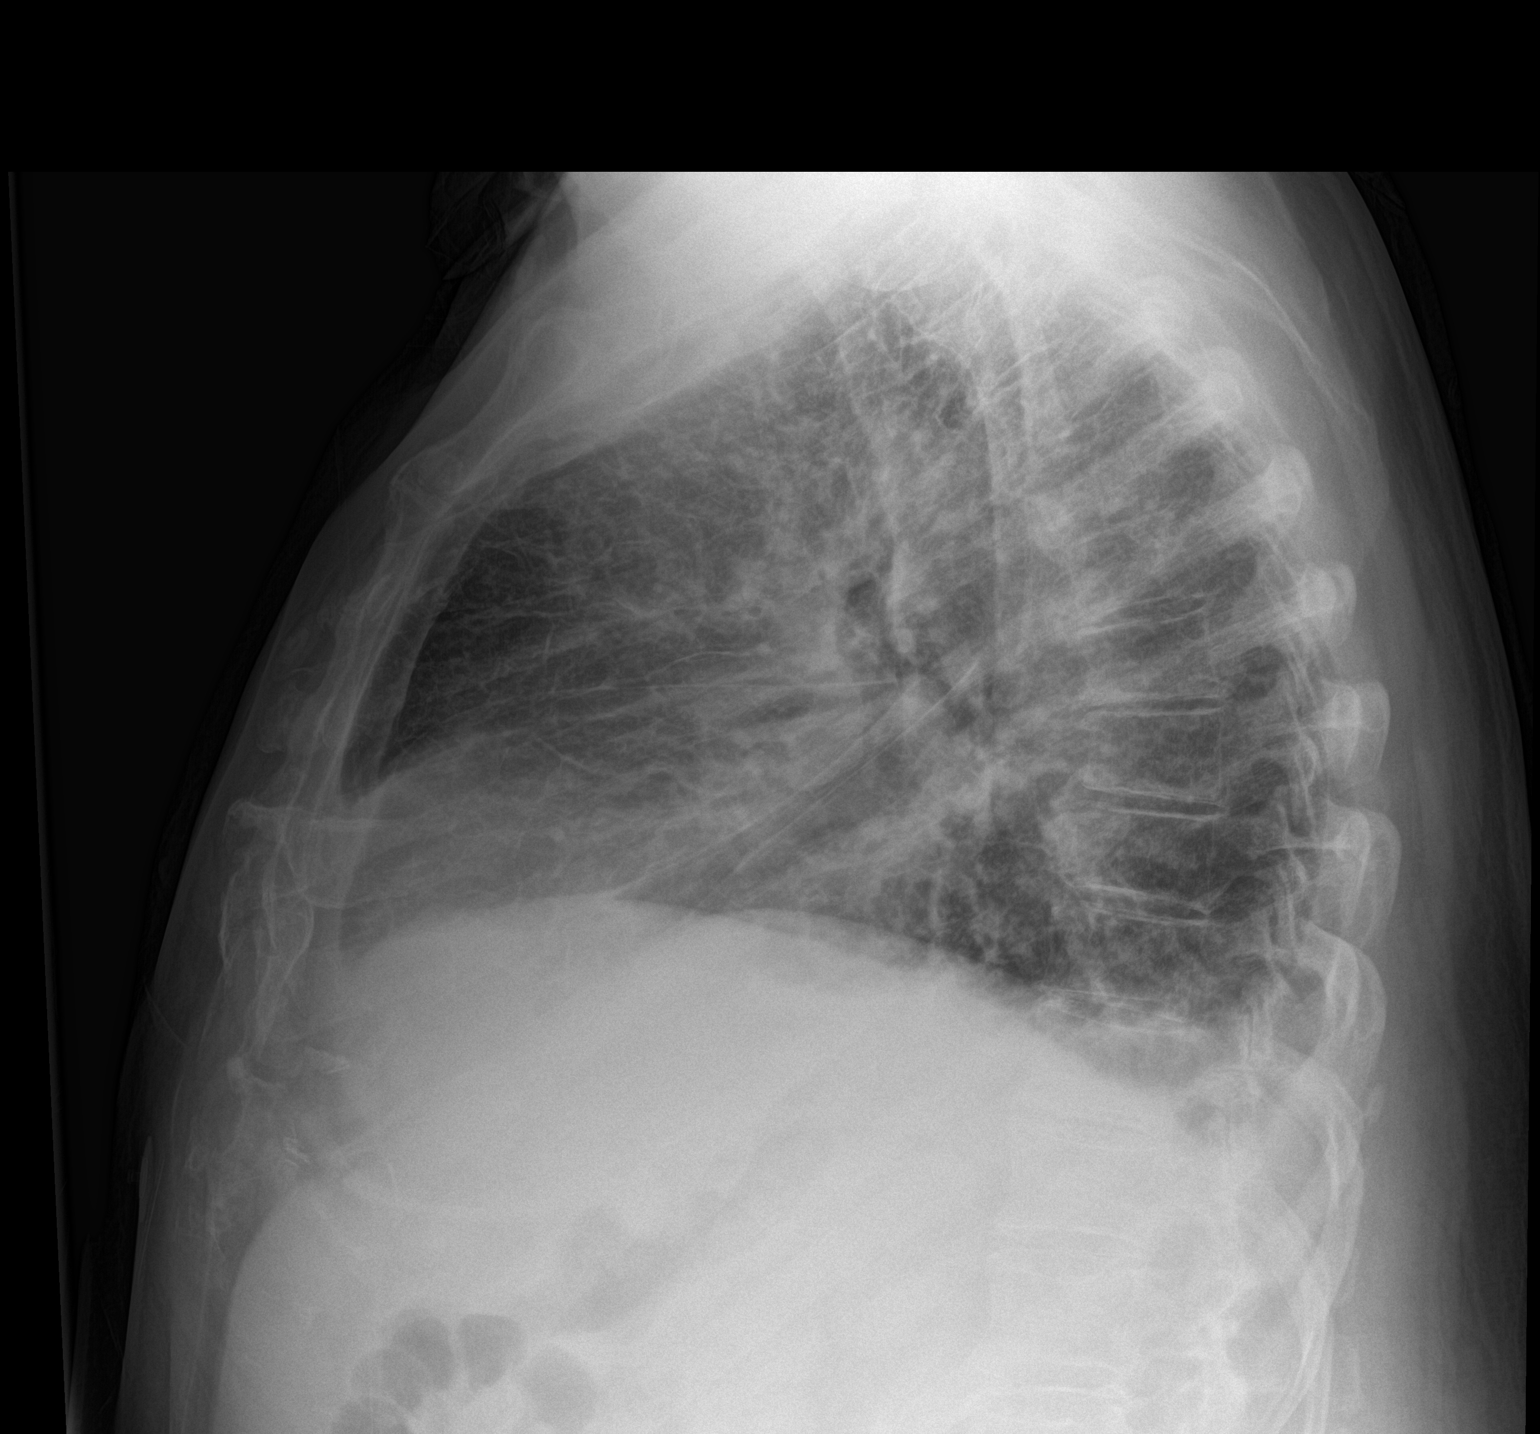

[2 of 2 positions shown; findings below may reference images not displayed]

FINDINGS: Slight rotation to the RIGHT on PA view.

Enlargement of cardiac silhouette.

Slight pulmonary vascular congestion.

Mediastinal contours normal.

Diffuse interstitial infiltrates with suspect small bibasilar
effusions question pulmonary edema/CHF.

Prominence of RIGHT hilum identified.

No pneumothorax.

Bones demineralized with scattered endplate spur formation thoracic
spine.
IMPRESSION: Enlargement of cardiac silhouette with slight vascular congestion
and scattered interstitial infiltrates stools due with type
bibasilar effusions, favoring CHF.

RIGHT hilum is prominent, though this may be in part related to
slight rotation to the RIGHT ; recommend followup radiographs in 2-4
weeks to reassess and exclude hilar mass/adenopathy.

## 2016-10-14 DIAGNOSIS — I82403 Acute embolism and thrombosis of unspecified deep veins of lower extremity, bilateral: Secondary | ICD-10-CM | POA: Diagnosis not present

## 2016-10-14 DIAGNOSIS — Z7901 Long term (current) use of anticoagulants: Secondary | ICD-10-CM | POA: Diagnosis not present

## 2016-11-19 ENCOUNTER — Ambulatory Visit: Payer: Medicare Other | Admitting: Neurology

## 2016-11-19 DIAGNOSIS — Z7901 Long term (current) use of anticoagulants: Secondary | ICD-10-CM | POA: Diagnosis not present

## 2016-11-19 DIAGNOSIS — I82403 Acute embolism and thrombosis of unspecified deep veins of lower extremity, bilateral: Secondary | ICD-10-CM | POA: Diagnosis not present

## 2016-12-17 DIAGNOSIS — Z86718 Personal history of other venous thrombosis and embolism: Secondary | ICD-10-CM | POA: Diagnosis not present

## 2016-12-17 DIAGNOSIS — I82403 Acute embolism and thrombosis of unspecified deep veins of lower extremity, bilateral: Secondary | ICD-10-CM | POA: Diagnosis not present

## 2016-12-17 DIAGNOSIS — Z7901 Long term (current) use of anticoagulants: Secondary | ICD-10-CM | POA: Diagnosis not present

## 2017-01-21 DIAGNOSIS — Z7901 Long term (current) use of anticoagulants: Secondary | ICD-10-CM | POA: Diagnosis not present

## 2017-01-21 DIAGNOSIS — I82403 Acute embolism and thrombosis of unspecified deep veins of lower extremity, bilateral: Secondary | ICD-10-CM | POA: Diagnosis not present

## 2017-01-21 DIAGNOSIS — Z86718 Personal history of other venous thrombosis and embolism: Secondary | ICD-10-CM | POA: Diagnosis not present

## 2017-02-23 ENCOUNTER — Encounter: Payer: Self-pay | Admitting: *Deleted

## 2017-02-24 DIAGNOSIS — I82403 Acute embolism and thrombosis of unspecified deep veins of lower extremity, bilateral: Secondary | ICD-10-CM | POA: Diagnosis not present

## 2017-02-24 DIAGNOSIS — Z86718 Personal history of other venous thrombosis and embolism: Secondary | ICD-10-CM | POA: Diagnosis not present

## 2017-02-24 DIAGNOSIS — Z7901 Long term (current) use of anticoagulants: Secondary | ICD-10-CM | POA: Diagnosis not present

## 2017-03-04 ENCOUNTER — Other Ambulatory Visit: Payer: Medicare Other | Admitting: *Deleted

## 2017-03-04 DIAGNOSIS — I5022 Chronic systolic (congestive) heart failure: Secondary | ICD-10-CM

## 2017-03-04 DIAGNOSIS — N179 Acute kidney failure, unspecified: Secondary | ICD-10-CM

## 2017-03-04 DIAGNOSIS — E119 Type 2 diabetes mellitus without complications: Secondary | ICD-10-CM | POA: Diagnosis not present

## 2017-03-04 LAB — COMPREHENSIVE METABOLIC PANEL
A/G RATIO: 1.8 (ref 1.2–2.2)
ALK PHOS: 61 IU/L (ref 39–117)
ALT: 17 IU/L (ref 0–44)
AST: 23 IU/L (ref 0–40)
Albumin: 4.2 g/dL (ref 3.5–4.7)
BUN / CREAT RATIO: 16 (ref 10–24)
BUN: 18 mg/dL (ref 8–27)
Bilirubin Total: 0.4 mg/dL (ref 0.0–1.2)
CALCIUM: 9.7 mg/dL (ref 8.6–10.2)
CO2: 23 mmol/L (ref 20–29)
CREATININE: 1.12 mg/dL (ref 0.76–1.27)
Chloride: 103 mmol/L (ref 96–106)
GFR calc Af Amer: 71 mL/min/{1.73_m2} (ref >59)
GFR calc non Af Amer: 62 mL/min/{1.73_m2} (ref >59)
GLOBULIN, TOTAL: 2.3 g/dL (ref 1.5–4.5)
GLUCOSE: 96 mg/dL (ref 65–99)
Potassium: 4.9 mmol/L (ref 3.5–5.2)
Sodium: 141 mmol/L (ref 134–144)
Total Protein: 6.5 g/dL (ref 6.0–8.5)

## 2017-03-04 LAB — LIPID PANEL
CHOLESTEROL TOTAL: 214 mg/dL — AB (ref 100–199)
Chol/HDL Ratio: 7.6 ratio — ABNORMAL HIGH (ref 0.0–5.0)
HDL: 28 mg/dL — ABNORMAL LOW (ref >39)
LDL CALC: 111 mg/dL — AB (ref 0–99)
Triglycerides: 374 mg/dL — ABNORMAL HIGH (ref 0–149)
VLDL CHOLESTEROL CAL: 75 mg/dL — AB (ref 5–40)

## 2017-03-11 ENCOUNTER — Encounter (INDEPENDENT_AMBULATORY_CARE_PROVIDER_SITE_OTHER): Payer: Self-pay

## 2017-03-11 ENCOUNTER — Encounter: Payer: Self-pay | Admitting: Cardiovascular Disease

## 2017-03-11 ENCOUNTER — Ambulatory Visit (INDEPENDENT_AMBULATORY_CARE_PROVIDER_SITE_OTHER): Payer: Medicare Other | Admitting: Cardiovascular Disease

## 2017-03-11 VITALS — BP 130/72 | HR 61 | Ht 66.0 in | Wt 216.8 lb

## 2017-03-11 DIAGNOSIS — E782 Mixed hyperlipidemia: Secondary | ICD-10-CM | POA: Insufficient documentation

## 2017-03-11 DIAGNOSIS — I5022 Chronic systolic (congestive) heart failure: Secondary | ICD-10-CM

## 2017-03-11 DIAGNOSIS — R0989 Other specified symptoms and signs involving the circulatory and respiratory systems: Secondary | ICD-10-CM

## 2017-03-11 MED ORDER — FENOFIBRATE 145 MG PO TABS: 145.0000 mg | ORAL_TABLET | Freq: Every day | ORAL | 3 refills | Status: DC

## 2017-03-11 NOTE — Progress Notes (Signed)
CARDIOLOGY OFFICE NOTE  Date:  03/11/2017    Michael Randolph Date of Birth: [birth year] Medical Record #[id number]  PCP:  Shon Baton, MD  Cardiologist:  Marcell Chavarin    Chief Complaint  Patient presents with  . Follow-up    CHF   Problem List 1. Chronic systolic CHF 2. Left leg DVT  Followed by right CVT- on chronic coumadin  3.  Hyperlipidemia 4.   Diabetes mellitus  5.  Kidney stone    History of Present Illness: Michael Randolph is a 82 y.o. male who presents today for a follow up visit. This is a 3 week check.  He has DM, prior DVT on Coumadin, prior TIA. He has HLD and he is reluctant to use statin due to history of myalgias and rash.  Presented back in May with dyspnea, chest pain and productive cough. Found to have combined systolic and diastolic HF. EF is 35 to 40%. Diuresed and started on beta blocker and ACE. Also with VT - had cardiac cath showing mild nonobstructive CAD with severe LV dysfunction - EF 20 to 25% and moderate pulmonary HTN.   I have seen him twice since discharge. He was improved clinically. Had a UTI which I treated. Have changed his Lasix to just prn and tried to increase ACE. BP is limiting how much titration we can do.   Comes back today. Here with his wife. Doing ok. BP cuff correlates. BP readings at home much lower - in the 37'H systolic. Breathing is good. No swelling. Weight is stable. Fleeting episode of dizziness. Going to [facility] to get a sleep work up.   March 12, 2015:   Michael Randolph was seen in the hospital in May. He has diastolic and systolic dysfunction. He had a cardiac catheterization which revealed minor coronary artery irregularities.  He was found to have a EF of 25-30% despite medical therapy.  It was recommended that he get an ICD/pacemaker. He's fairly adamant that he does not want a pacemaker at this time.  His heart rate and blood pressure had been quite low. He has discontinued the metoprolol and the  lisinopril.  He's feeling quite a bit better than when I met him in the hospital in May. He's able to do all of his normal activities without severe chest pain or shortness breath.   Nov. 18, 2016: Seems to be doing better.  Walks regularly .  Has been deer hunting .  Has stopped his Lisinopril because it made him feel poorly .  Brought his BP log.  Readings are all good.   105-120s   Feb. 27, 2017:  Doing ok Did not tolerate the Lisinopril - so he stopped it Did not tolerate the Aldactone  - stopped it   Dec. 1, 2017:  Doing ok.     Was in the hospital in Nov with right middle lobe  pneumonia .   blood cultures growing H influenza- Echo   In follow up for his chronic systolic CHF.    EF is now 25-30%.   Has grade 2 diastolic dysfunction   He's kept a blood pressure record. His blood pressures are arm on the low normal side. Heart rate is in the 50s to 60s.  Wanted to relay a long story about his hospitalization over a year ago .    Still feels dizzy.   HR has been slow   Aug. 29, 2018: Michael Randolph is seen today for follow up of his  CHF  Trigs are very elevated.    Past Medical History:  Diagnosis Date  . Congestive heart failure (CHF) ([place])   . Diabetes mellitus without complication (HCC)    no medications  . Diverticulosis   . DVT (deep venous thrombosis) ([place])   . Dyspnea   . Dysuria   . Heart disease   . Hyperlipemia   . Kidney stone   . Stroke ([place])   . TIA (transient ischemic attack) 2008   was seen on CT   . Toe infection     Past Surgical History:  Procedure Laterality Date  . CARDIAC CATHETERIZATION N/A 12/01/2014   Procedure: Right/Left Heart Cath and Coronary Angiography;  Surgeon: Jettie Booze, MD;  Location: [facility];  Service: Cardiovascular;  Laterality: N/A;  . COLONOSCOPY  2013  . ROTATOR CUFF REPAIR Right      Medications: Current Outpatient Prescriptions  Medication Sig Dispense Refill  . aspirin 81 MG tablet Take 81 mg by  mouth at bedtime.     . Cholecalciferol 2000 UNITS CAPS Take 1 capsule by mouth 2 (two) times daily.     . furosemide (LASIX) 20 MG tablet Take 1 tablet (20 mg total) by mouth daily as needed for fluid or edema. May use extra prn weight gain 30 tablet 6  . GARLIC PO Take 1 tablet by mouth daily.    Marland Kitchen losartan (COZAAR) 25 MG tablet Take 1 tablet (25 mg total) by mouth daily. 90 tablet 3  . Magnesium 250 MG TABS Take 1 tablet by mouth 2 (two) times daily.     . Methylsulfonylmethane (MSM) 1000 MG CAPS Take 1,000 mg by mouth daily.     . metoprolol succinate (TOPROL-XL) 25 MG 24 hr tablet Take 0.5 tablets (12.5 mg total) by mouth daily. Take with or immediately following a meal. 45 tablet 3  . warfarin (COUMADIN) 5 MG tablet Take 2.5-5 mg by mouth at bedtime. Takes 52m everyday except on Monday's takes 2.557m    No current facility-administered medications for this visit.     Allergies: Allergies  Allergen Reactions  . Actos [Pioglitazone] Other (See Comments)    Slow HR and pain in face   . Anesthetics, Halogenated     Slept too long  . Crestor [Rosuvastatin Calcium]     Pain   . Sulfa Antibiotics Other (See Comments)    hallucinations  . Dye Fdc Red [Red Dye] Rash    # 40    Social History: The patient  reports that he has never smoked. He has never used smokeless tobacco. He reports that he does not drink alcohol or use drugs.   Family History: The patient's family history includes Crohn's disease in his brother; Deep vein thrombosis in his father; Heart attack in his father; Hypertension in his mother; Schizophrenia in his sister; Stroke in his mother.   Review of Systems: Please see the history of present illness.   Otherwise, the review of systems is positive for none.   All other systems are reviewed and negative.   Physical Exam: VS:  BP 130/72   Pulse 61   Ht 5' 6"  (1.676 m)   Wt 216 lb 12.8 oz (98.3 kg)   SpO2 96%   BMI 34.99 kg/m  .  BMI Body mass index is 34.99  kg/m.  Wt Readings from Last 3 Encounters:  03/11/17 216 lb 12.8 oz (98.3 kg)  06/13/16 210 lb 6.4 oz (95.4 kg)  05/19/16 218  lb 1.6 oz (98.9 kg)    General: Pleasant. Well developed, well nourished and in no acute distress.   HEENT: Normal.  Neck: Supple, no JVD, soft left carotid bruit, no or masses noted.  Cardiac: Regular rate and rhythm. No murmurs, rubs, or gallops. No edema.  Respiratory:  Lungs are clear to auscultation bilaterally with normal work of breathing.  GI: Soft and nontender.  MS: No deformity or atrophy. Gait and ROM intact.  Skin: Warm and dry. Color is normal.  Neuro:  Strength and sensation are intact and no gross focal deficits noted.  Psych: Alert, appropriate and with normal affect.  LABORATORY DATA:  EKG:  EKG is ordered today.   Aug. 29, 2018:  NSR at 61. NS ST abn  Lab Results  Component Value Date   WBC 11.2 (H) 05/19/2016   HGB 12.5 (L) 05/19/2016   HCT 37.8 (L) 05/19/2016   PLT 198 05/19/2016   GLUCOSE 96 03/04/2017   CHOL 214 (H) 03/04/2017   TRIG 374 (H) 03/04/2017   HDL 28 (L) 03/04/2017   LDLCALC 111 (H) 03/04/2017   ALT 17 03/04/2017   AST 23 03/04/2017   NA 141 03/04/2017   K 4.9 03/04/2017   CL 103 03/04/2017   CREATININE 1.12 03/04/2017   BUN 18 03/04/2017   CO2 23 03/04/2017   TSH 4.347 11/29/2014   INR 2.03 05/19/2016   HGBA1C 6.1 (H) 11/29/2014    BNP (last 3 results)  Recent Labs  05/16/16 1322  BNP 1,280.2*    ProBNP (last 3 results) No results for input(s): PROBNP in the last 8760 hours.   Other Studies Reviewed Today:    Current medicines are reviewed with the patient today.  The patient does not have concerns regarding medicines other than what has been noted above.  The following changes have been made:  See above.  Labs/ tests ordered today include:    No orders of the defined types were placed in this encounter.    Assessment and plan 1. Chronic systolic congestive  heart failure:  It's  been to have to keep him on standard medical regimen but he seems to be on the right medications at this time. His heart rate is a little slow and he has been feeling some dizziness.  He has lasix prescribed but does not take it   On Losartan 25 mg a day  Toprol 12.5 a day  Continue current meds.    2.  Carotid bruit:  Will check carotid duplex   3.  Hyperlipidemia :   Trigs are very elevated.  Add fenofibrate 145 mg a day .   He needs to greatly decrease his intake of carbs and fats    I'll see him again in 6 months for follow-up visit. I anticipate repeating his echocardiogram around that time. He is not interested in an ICD.    2. Hyperlipidemia:  3. History of DVT: Continue Coumadin.   Mertie Moores, MD  03/11/2017 8:49 AM    [facility] [address],  [place], [place]  [address] Pager [phone] Phone: [phone]; Fax: [phone]

## 2017-03-11 NOTE — Patient Instructions (Signed)
Medication Instructions:  START Fenofibrate (Tricor) 145 mg once daily   Labwork: Your physician recommends that you return for lab work in: 6 months on the day of or a few days before your office visit with Dr. Acie Fredrickson.  You will need to FAST for this appointment - nothing to eat or drink after midnight the night before except water.    Testing/Procedures: Your physician has requested that you have a carotid duplex. This test is an ultrasound of the carotid arteries in your neck. It looks at blood flow through these arteries that supply the brain with blood. Allow one hour for this exam. There are no restrictions or special instructions.    Follow-Up: Your physician wants you to follow-up in: 6 months with Dr. Acie Fredrickson.  You will receive a reminder letter in the mail two months in advance. If you don't receive a letter, please call our office to schedule the follow-up appointment.   If you need a refill on your cardiac medications before your next appointment, please call your pharmacy.   Thank you for choosing [facility]! Christen Bame, RN [phone]     Food Choices to Lower Your Triglycerides Triglycerides are a type of fat in your blood. High levels of triglycerides can increase the risk of heart disease and stroke. If your triglyceride levels are high, the foods you eat and your eating habits are very important. Choosing the right foods can help lower your triglycerides. What general guidelines do I need to follow?  Lose weight if you are overweight.  Limit or avoid alcohol.  Fill one half of your plate with vegetables and green salads.  Limit fruit to two servings a day. Choose fruit instead of juice.  Make one fourth of your plate whole grains. Look for the word "whole" as the first word in the ingredient list.  Fill one fourth of your plate with lean protein foods.  Enjoy fatty fish (such as salmon, mackerel, sardines, and tuna) three times a  week.  Choose healthy fats.  Limit foods high in starch and sugar.  Eat more home-cooked food and less restaurant, buffet, and fast food.  Limit fried foods.  Cook foods using methods other than frying.  Limit saturated fats.  Check ingredient lists to avoid foods with partially hydrogenated oils (trans fats) in them. What foods can I eat? Grains Whole grains, such as whole wheat or whole grain breads, crackers, cereals, and pasta. Unsweetened oatmeal, bulgur, barley, quinoa, or brown rice. Corn or whole wheat flour tortillas. Vegetables Fresh or frozen vegetables (raw, steamed, roasted, or grilled). Green salads. Fruits All fresh, canned (in natural juice), or frozen fruits. Meat and Other Protein Products Ground beef (85% or leaner), grass-fed beef, or beef trimmed of fat. Skinless chicken or [place]. Ground chicken or [place]. Pork trimmed of fat. All fish and seafood. Eggs. Dried beans, peas, or lentils. Unsalted nuts or seeds. Unsalted canned or dry beans. Dairy Low-fat dairy products, such as skim or 1% milk, 2% or reduced-fat cheeses, low-fat ricotta or cottage cheese, or plain low-fat yogurt. Fats and Oils Tub margarines without trans fats. Light or reduced-fat mayonnaise and salad dressings. Avocado. Safflower, olive, or canola oils. Natural peanut or almond butter. The items listed above may not be a complete list of recommended foods or beverages. Contact your dietitian for more options. What foods are not recommended? Grains White bread. White pasta. White rice. Cornbread. Bagels, pastries, and croissants. Crackers that contain trans fat. Vegetables White potatoes. Corn. Creamed or  fried vegetables. Vegetables in a cheese sauce. Fruits Dried fruits. Canned fruit in light or heavy syrup. Fruit juice. Meat and Other Protein Products Fatty cuts of meat. Ribs, chicken wings, bacon, sausage, bologna, salami, chitterlings, fatback, hot dogs, bratwurst, and packaged luncheon  meats. Dairy Whole or 2% milk, cream, half-and-half, and cream cheese. Whole-fat or sweetened yogurt. Full-fat cheeses. Nondairy creamers and whipped toppings. Processed cheese, cheese spreads, or cheese curds. Sweets and Desserts Corn syrup, sugars, honey, and molasses. Candy. Jam and jelly. Syrup. Sweetened cereals. Cookies, pies, cakes, donuts, muffins, and ice cream. Fats and Oils Butter, stick margarine, lard, shortening, ghee, or bacon fat. Coconut, palm kernel, or palm oils. Beverages Alcohol. Sweetened drinks (such as sodas, lemonade, and fruit drinks or punches). The items listed above may not be a complete list of foods and beverages to avoid. Contact your dietitian for more information. This information is not intended to replace advice given to you by your health care provider. Make sure you discuss any questions you have with your health care provider. Document Released: 04/17/2004 Document Revised: 12/06/2015 Document Reviewed: 05/04/2013 Elsevier Interactive Patient Education  2017 [employer].

## 2017-03-12 DIAGNOSIS — Z7901 Long term (current) use of anticoagulants: Secondary | ICD-10-CM | POA: Diagnosis not present

## 2017-03-18 ENCOUNTER — Ambulatory Visit (HOSPITAL_COMMUNITY)
Admission: RE | Admit: 2017-03-18 | Discharge: 2017-03-18 | Disposition: A | Discharge status: Home / Self Care | Payer: Medicare Other | Source: Ambulatory Visit | Attending: Cardiology | Admitting: Cardiology

## 2017-03-18 DIAGNOSIS — E782 Mixed hyperlipidemia: Secondary | ICD-10-CM | POA: Insufficient documentation

## 2017-03-18 DIAGNOSIS — R0989 Other specified symptoms and signs involving the circulatory and respiratory systems: Secondary | ICD-10-CM

## 2017-03-23 ENCOUNTER — Telehealth: Payer: Self-pay | Admitting: Cardiovascular Disease

## 2017-03-23 DIAGNOSIS — E119 Type 2 diabetes mellitus without complications: Secondary | ICD-10-CM | POA: Diagnosis not present

## 2017-03-23 DIAGNOSIS — E784 Other hyperlipidemia: Secondary | ICD-10-CM | POA: Diagnosis not present

## 2017-03-23 DIAGNOSIS — R8299 Other abnormal findings in urine: Secondary | ICD-10-CM | POA: Diagnosis not present

## 2017-03-23 DIAGNOSIS — Z125 Encounter for screening for malignant neoplasm of prostate: Secondary | ICD-10-CM | POA: Diagnosis not present

## 2017-03-23 NOTE — Telephone Encounter (Signed)
Reviewed results of carotid u/s with patient who verbalized understanding. He thanked me for the call.

## 2017-03-23 NOTE — Telephone Encounter (Signed)
New message      Returning [place] call

## 2017-03-30 DIAGNOSIS — I2789 Other specified pulmonary heart diseases: Secondary | ICD-10-CM | POA: Diagnosis not present

## 2017-03-30 DIAGNOSIS — Z6834 Body mass index (BMI) 34.0-34.9, adult: Secondary | ICD-10-CM | POA: Diagnosis not present

## 2017-03-30 DIAGNOSIS — I5041 Acute combined systolic (congestive) and diastolic (congestive) heart failure: Secondary | ICD-10-CM | POA: Diagnosis not present

## 2017-03-30 DIAGNOSIS — Z0000 Encounter for general adult medical examination without abnormal findings: Secondary | ICD-10-CM | POA: Diagnosis not present

## 2017-03-30 DIAGNOSIS — Z1389 Encounter for screening for other disorder: Secondary | ICD-10-CM | POA: Diagnosis not present

## 2017-03-30 DIAGNOSIS — N183 Chronic kidney disease, stage 3 (moderate): Secondary | ICD-10-CM | POA: Diagnosis not present

## 2017-03-30 DIAGNOSIS — E784 Other hyperlipidemia: Secondary | ICD-10-CM | POA: Diagnosis not present

## 2017-03-30 DIAGNOSIS — Z7901 Long term (current) use of anticoagulants: Secondary | ICD-10-CM | POA: Diagnosis not present

## 2017-03-30 DIAGNOSIS — Z23 Encounter for immunization: Secondary | ICD-10-CM | POA: Diagnosis not present

## 2017-03-30 DIAGNOSIS — I472 Ventricular tachycardia: Secondary | ICD-10-CM | POA: Diagnosis not present

## 2017-03-30 DIAGNOSIS — I509 Heart failure, unspecified: Secondary | ICD-10-CM | POA: Diagnosis not present

## 2017-04-02 DIAGNOSIS — Z1212 Encounter for screening for malignant neoplasm of rectum: Secondary | ICD-10-CM | POA: Diagnosis not present

## 2017-04-09 DIAGNOSIS — I82403 Acute embolism and thrombosis of unspecified deep veins of lower extremity, bilateral: Secondary | ICD-10-CM | POA: Diagnosis not present

## 2017-04-09 DIAGNOSIS — Z7901 Long term (current) use of anticoagulants: Secondary | ICD-10-CM | POA: Diagnosis not present

## 2017-05-07 DIAGNOSIS — I82403 Acute embolism and thrombosis of unspecified deep veins of lower extremity, bilateral: Secondary | ICD-10-CM | POA: Diagnosis not present

## 2017-05-07 DIAGNOSIS — Z7901 Long term (current) use of anticoagulants: Secondary | ICD-10-CM | POA: Diagnosis not present

## 2017-06-10 DIAGNOSIS — I82403 Acute embolism and thrombosis of unspecified deep veins of lower extremity, bilateral: Secondary | ICD-10-CM | POA: Diagnosis not present

## 2017-06-10 DIAGNOSIS — Z7901 Long term (current) use of anticoagulants: Secondary | ICD-10-CM | POA: Diagnosis not present

## 2017-07-15 DIAGNOSIS — I5021 Acute systolic (congestive) heart failure: Secondary | ICD-10-CM | POA: Diagnosis not present

## 2017-07-15 DIAGNOSIS — I82403 Acute embolism and thrombosis of unspecified deep veins of lower extremity, bilateral: Secondary | ICD-10-CM | POA: Diagnosis not present

## 2017-07-15 DIAGNOSIS — Z7901 Long term (current) use of anticoagulants: Secondary | ICD-10-CM | POA: Diagnosis not present

## 2017-07-25 ENCOUNTER — Other Ambulatory Visit: Payer: Self-pay

## 2017-07-25 ENCOUNTER — Inpatient Hospital Stay (HOSPITAL_COMMUNITY)
Admission: EM | Admit: 2017-07-25 | Discharge: 2017-08-05 | DRG: 292 | Disposition: A | Discharge status: Home / Self Care | Payer: Medicare Other | Attending: Cardiology | Admitting: Cardiology

## 2017-07-25 ENCOUNTER — Emergency Department (HOSPITAL_COMMUNITY): Payer: Medicare Other

## 2017-07-25 ENCOUNTER — Encounter (HOSPITAL_COMMUNITY): Payer: Self-pay | Admitting: Emergency Medicine

## 2017-07-25 DIAGNOSIS — Z87442 Personal history of urinary calculi: Secondary | ICD-10-CM | POA: Diagnosis not present

## 2017-07-25 DIAGNOSIS — Z7901 Long term (current) use of anticoagulants: Secondary | ICD-10-CM | POA: Diagnosis not present

## 2017-07-25 DIAGNOSIS — E871 Hypo-osmolality and hyponatremia: Secondary | ICD-10-CM | POA: Diagnosis present

## 2017-07-25 DIAGNOSIS — R0602 Shortness of breath: Secondary | ICD-10-CM

## 2017-07-25 DIAGNOSIS — Z91041 Radiographic dye allergy status: Secondary | ICD-10-CM

## 2017-07-25 DIAGNOSIS — I428 Other cardiomyopathies: Secondary | ICD-10-CM | POA: Diagnosis present

## 2017-07-25 DIAGNOSIS — I5043 Acute on chronic combined systolic (congestive) and diastolic (congestive) heart failure: Principal | ICD-10-CM | POA: Diagnosis present

## 2017-07-25 DIAGNOSIS — Z882 Allergy status to sulfonamides status: Secondary | ICD-10-CM | POA: Diagnosis not present

## 2017-07-25 DIAGNOSIS — I2510 Atherosclerotic heart disease of native coronary artery without angina pectoris: Secondary | ICD-10-CM | POA: Diagnosis present

## 2017-07-25 DIAGNOSIS — Z86718 Personal history of other venous thrombosis and embolism: Secondary | ICD-10-CM | POA: Diagnosis not present

## 2017-07-25 DIAGNOSIS — E785 Hyperlipidemia, unspecified: Secondary | ICD-10-CM | POA: Diagnosis present

## 2017-07-25 DIAGNOSIS — Z884 Allergy status to anesthetic agent status: Secondary | ICD-10-CM | POA: Diagnosis not present

## 2017-07-25 DIAGNOSIS — I252 Old myocardial infarction: Secondary | ICD-10-CM

## 2017-07-25 DIAGNOSIS — I5023 Acute on chronic systolic (congestive) heart failure: Secondary | ICD-10-CM

## 2017-07-25 DIAGNOSIS — N179 Acute kidney failure, unspecified: Secondary | ICD-10-CM | POA: Diagnosis not present

## 2017-07-25 DIAGNOSIS — I493 Ventricular premature depolarization: Secondary | ICD-10-CM

## 2017-07-25 DIAGNOSIS — E119 Type 2 diabetes mellitus without complications: Secondary | ICD-10-CM | POA: Diagnosis present

## 2017-07-25 DIAGNOSIS — Z8673 Personal history of transient ischemic attack (TIA), and cerebral infarction without residual deficits: Secondary | ICD-10-CM | POA: Diagnosis not present

## 2017-07-25 DIAGNOSIS — Z888 Allergy status to other drugs, medicaments and biological substances status: Secondary | ICD-10-CM

## 2017-07-25 DIAGNOSIS — E782 Mixed hyperlipidemia: Secondary | ICD-10-CM | POA: Diagnosis present

## 2017-07-25 DIAGNOSIS — I340 Nonrheumatic mitral (valve) insufficiency: Secondary | ICD-10-CM | POA: Diagnosis present

## 2017-07-25 DIAGNOSIS — Z7982 Long term (current) use of aspirin: Secondary | ICD-10-CM | POA: Diagnosis not present

## 2017-07-25 DIAGNOSIS — K5790 Diverticulosis of intestine, part unspecified, without perforation or abscess without bleeding: Secondary | ICD-10-CM | POA: Diagnosis present

## 2017-07-25 DIAGNOSIS — Z818 Family history of other mental and behavioral disorders: Secondary | ICD-10-CM

## 2017-07-25 DIAGNOSIS — I509 Heart failure, unspecified: Secondary | ICD-10-CM | POA: Diagnosis not present

## 2017-07-25 DIAGNOSIS — R748 Abnormal levels of other serum enzymes: Secondary | ICD-10-CM | POA: Diagnosis not present

## 2017-07-25 DIAGNOSIS — Z8249 Family history of ischemic heart disease and other diseases of the circulatory system: Secondary | ICD-10-CM | POA: Diagnosis not present

## 2017-07-25 DIAGNOSIS — Z823 Family history of stroke: Secondary | ICD-10-CM | POA: Diagnosis not present

## 2017-07-25 DIAGNOSIS — R0789 Other chest pain: Secondary | ICD-10-CM | POA: Diagnosis not present

## 2017-07-25 DIAGNOSIS — R42 Dizziness and giddiness: Secondary | ICD-10-CM | POA: Diagnosis not present

## 2017-07-25 DIAGNOSIS — G4733 Obstructive sleep apnea (adult) (pediatric): Secondary | ICD-10-CM | POA: Diagnosis present

## 2017-07-25 DIAGNOSIS — I272 Pulmonary hypertension, unspecified: Secondary | ICD-10-CM | POA: Diagnosis present

## 2017-07-25 DIAGNOSIS — I5042 Chronic combined systolic (congestive) and diastolic (congestive) heart failure: Secondary | ICD-10-CM

## 2017-07-25 DIAGNOSIS — E876 Hypokalemia: Secondary | ICD-10-CM | POA: Diagnosis not present

## 2017-07-25 HISTORY — DX: Other cardiomyopathies: I42.8

## 2017-07-25 HISTORY — DX: Obstructive sleep apnea (adult) (pediatric): G47.33

## 2017-07-25 LAB — I-STAT TROPONIN, ED: Troponin i, poc: 0.1 ng/mL (ref 0.00–0.08)

## 2017-07-25 LAB — BASIC METABOLIC PANEL
Anion gap: 12 (ref 5–15)
BUN: 30 mg/dL — AB (ref 6–20)
CHLORIDE: 95 mmol/L — AB (ref 101–111)
CO2: 21 mmol/L — AB (ref 22–32)
CREATININE: 1.19 mg/dL (ref 0.61–1.24)
Calcium: 8.9 mg/dL (ref 8.9–10.3)
GFR calc Af Amer: >60 mL/min (ref >60)
GFR calc non Af Amer: 56 mL/min — ABNORMAL LOW (ref >60)
GLUCOSE: 187 mg/dL — AB (ref 65–99)
POTASSIUM: 5.2 mmol/L — AB (ref 3.5–5.1)
Sodium: 128 mmol/L — ABNORMAL LOW (ref 135–145)

## 2017-07-25 LAB — CBC
HEMATOCRIT: 38.5 % — AB (ref 39.0–52.0)
Hemoglobin: 13.2 g/dL (ref 13.0–17.0)
MCH: 27.6 pg (ref 26.0–34.0)
MCHC: 34.3 g/dL (ref 30.0–36.0)
MCV: 80.5 fL (ref 78.0–100.0)
PLATELETS: 258 10*3/uL (ref 150–400)
RBC: 4.78 MIL/uL (ref 4.22–5.81)
RDW: 13.2 % (ref 11.5–15.5)
WBC: 12 10*3/uL — AB (ref 4.0–10.5)

## 2017-07-25 LAB — HEPATIC FUNCTION PANEL
ALBUMIN: 3.5 g/dL (ref 3.5–5.0)
ALT: 63 U/L (ref 17–63)
AST: 66 U/L — AB (ref 15–41)
Alkaline Phosphatase: 58 U/L (ref 38–126)
BILIRUBIN TOTAL: 1.2 mg/dL (ref 0.3–1.2)
Bilirubin, Direct: 0.3 mg/dL (ref 0.1–0.5)
Indirect Bilirubin: 0.9 mg/dL (ref 0.3–0.9)
Total Protein: 6.6 g/dL (ref 6.5–8.1)

## 2017-07-25 LAB — LIPASE, BLOOD: Lipase: 28 U/L (ref 11–51)

## 2017-07-25 LAB — TROPONIN I: TROPONIN I: 0.1 ng/mL — AB (ref <0.03)

## 2017-07-25 LAB — PROTIME-INR
INR: 2.76
PROTHROMBIN TIME: 29 s — AB (ref 11.4–15.2)

## 2017-07-25 LAB — BRAIN NATRIURETIC PEPTIDE: B Natriuretic Peptide: 1864.1 pg/mL — ABNORMAL HIGH (ref 0.0–100.0)

## 2017-07-25 MED ORDER — SODIUM CHLORIDE 0.9% FLUSH: 3.0000 mL | INTRAVENOUS | Status: DC | PRN

## 2017-07-25 MED ORDER — WARFARIN SODIUM 2.5 MG PO TABS: 2.5000 mg | ORAL_TABLET | ORAL | Status: DC

## 2017-07-25 MED ORDER — WARFARIN - PHARMACIST DOSING INPATIENT
Freq: Every day | Status: DC
  Administered 2017-07-25: 1
  Administered 2017-07-26 – 2017-07-28 (×3)
  Administered 2017-07-29: 2.5
  Administered 2017-07-31 – 2017-08-03 (×2)

## 2017-07-25 MED ORDER — VITAMIN D 1000 UNITS PO TABS
2000.0000 [IU] | ORAL_TABLET | Freq: Two times a day (BID) | ORAL | Status: DC
  Administered 2017-07-25 – 2017-08-05 (×21): 2000 [IU] via ORAL
  Filled 2017-07-25 (×22): qty 2

## 2017-07-25 MED ORDER — ACETAMINOPHEN 325 MG PO TABS: 650.0000 mg | ORAL_TABLET | ORAL | Status: DC | PRN

## 2017-07-25 MED ORDER — LOSARTAN POTASSIUM 25 MG PO TABS
25.0000 mg | ORAL_TABLET | Freq: Every day | ORAL | Status: DC
  Administered 2017-07-25 – 2017-07-26 (×2): 25 mg via ORAL
  Filled 2017-07-25 (×2): qty 1

## 2017-07-25 MED ORDER — FUROSEMIDE 10 MG/ML IJ SOLN
40.0000 mg | Freq: Two times a day (BID) | INTRAMUSCULAR | Status: DC
  Administered 2017-07-25 – 2017-07-26 (×2): 40 mg via INTRAVENOUS
  Filled 2017-07-25 (×4): qty 4

## 2017-07-25 MED ORDER — WARFARIN SODIUM 5 MG PO TABS
5.0000 mg | ORAL_TABLET | Freq: Once | ORAL | Status: AC
  Administered 2017-07-25: 5 mg via ORAL
  Filled 2017-07-25 (×2): qty 1

## 2017-07-25 MED ORDER — SALINE SPRAY 0.65 % NA SOLN
1.0000 | NASAL | Status: DC | PRN
  Filled 2017-07-25: qty 44

## 2017-07-25 MED ORDER — SODIUM CHLORIDE 0.9% FLUSH
3.0000 mL | Freq: Two times a day (BID) | INTRAVENOUS | Status: DC
  Administered 2017-07-25 – 2017-08-05 (×11): 3 mL via INTRAVENOUS

## 2017-07-25 MED ORDER — ONDANSETRON HCL 4 MG/2ML IJ SOLN: 4.0000 mg | Freq: Four times a day (QID) | INTRAMUSCULAR | Status: DC | PRN

## 2017-07-25 MED ORDER — SODIUM CHLORIDE 0.9 % IV SOLN
250.0000 mL | INTRAVENOUS | Status: DC | PRN
  Administered 2017-07-28: 250 mL via INTRAVENOUS

## 2017-07-25 NOTE — ED Notes (Signed)
Attempted IV left wrist and right wrist, unable to obtain.

## 2017-07-25 NOTE — ED Notes (Signed)
Admitting team at bedside.

## 2017-07-25 NOTE — ED Triage Notes (Signed)
Pt. Stated, Michael Randolph been SOB since Dec. 22, and I cant walk hardly any distance and they have increased the lasix and no better.

## 2017-07-25 NOTE — Progress Notes (Addendum)
ANTICOAGULATION CONSULT NOTE - Initial Consult  Pharmacy Consult for warfarin Indication: DVT  Allergies  Allergen Reactions  . Actos [Pioglitazone] Other (See Comments)    Slow HR and pain in face   . Anesthetics, Halogenated     Slept too long  . Crestor [Rosuvastatin Calcium]     Pain   . Sulfa Antibiotics Other (See Comments)    hallucinations  . Dye Fdc Red [Red Dye] Rash    # 40    Patient Measurements: Height: 5\' 7"  (170.2 cm) Weight: 215 lb (97.5 kg) IBW/kg (Calculated) : 66.1  Vital Signs: Temp: 97.6 F (36.4 C) (01/12 1206) Temp Source: Oral (01/12 1206) BP: 123/89 (01/12 1430) Pulse Rate: 103 (01/12 1430)  Labs: Recent Labs    07/25/17 1208  HGB 13.2  HCT 38.5*  PLT 258  LABPROT 29.0*  INR 2.76  CREATININE 1.19    Estimated Creatinine Clearance: 55.1 mL/min (by C-G formula based on SCr of 1.19 mg/dL).   Medical History: Past Medical History:  Diagnosis Date  . Congestive heart failure (CHF) ([place])   . Diabetes mellitus without complication (HCC)    no medications  . Diverticulosis   . DVT (deep venous thrombosis) ([place])   . Heart disease   . Hyperlipemia   . Kidney stone   . NICM (nonischemic cardiomyopathy) ([place])   . OSA (obstructive sleep apnea)   . Stroke ([place])   . TIA (transient ischemic attack) 2008   was seen on CT   . Toe infection    Assessment: 31 yom being evaluated for acute on chronic CHF, on warfarin PTA for chronic DVTs. Home regimen is 5 mg daily except 2.5 mg on MWF. Following with Dr Oretha Ellis at [facility]. Last dose was 2.5 mg on 1/11.  INR today on admission 2.76. Hgb is 13.2 and platelets are 258. No signs/symptoms of bleeding.   Goal of Therapy:  INR 2-3 Monitor platelets by anticoagulation protocol: Yes   Plan:  Order warfarin 5 mg tonight Monitor daily INR and CBC  Doylene Canard, PharmD Clinical Pharmacist  Pager: [phone] Phone: [phone] 07/25/2017,5:16 PM

## 2017-07-25 NOTE — H&P (Addendum)
Cardiology Consultation:   Patient ID: Michael Randolph; [id number]; 02-09-1935   Admit date: 07/25/2017 Date of Consult: 07/25/2017  Primary Care Provider: Shon Baton, MD Primary Cardiologist: Dr Acie Fredrickson    Patient Profile:   Michael Randolph is a 83 y.o. male with a hx of nonischemic cardiomyopathy who is being seen today for the evaluation of acute on chronic systolic congestive heart failure.  History of Present Illness:   Patient has a history of nonischemic cardiomyopathy.  Cardiac catheterization May 2016 showed mild nonobstructive coronary disease and ejection fraction 20-25%.  Last echocardiogram November 2017 showed severe LV dysfunction with ejection fraction 25-30%.  There was grade 2 diastolic dysfunction.  Mild mitral regurgitation and severe left atrial enlargement.  Patient has not been taking ARB or beta-blocker since August.  Over the past 3 weeks he has noticed increasing dyspnea on exertion, orthopnea, PND and in the past 2 days worsening pedal edema bilaterally.  He denies chest pain, fevers, chills, productive cough or hemoptysis.  He did start taking Lasix 20 mg daily 2 weeks ago and then increase to 40 mg daily without improvement.  He now presents for further evaluation.  Past Medical History:  Diagnosis Date  . Congestive heart failure (CHF) ([place])   . Diabetes mellitus without complication (HCC)    no medications  . Diverticulosis   . DVT (deep venous thrombosis) ([place])   . Heart disease   . Hyperlipemia   . Kidney stone   . NICM (nonischemic cardiomyopathy) ([place])   . OSA (obstructive sleep apnea)   . Stroke ([place])   . TIA (transient ischemic attack) 2008   was seen on CT   . Toe infection     Past Surgical History:  Procedure Laterality Date  . CARDIAC CATHETERIZATION N/A 12/01/2014   Procedure: Right/Left Heart Cath and Coronary Angiography;  Surgeon: Jettie Booze, MD;  Location: [facility];  Service: Cardiovascular;  Laterality: N/A;  .  COLONOSCOPY  2013  . ROTATOR CUFF REPAIR Right      Inpatient Medications: Scheduled Meds:  Continuous Infusions:  PRN Meds:   Allergies:    Allergies  Allergen Reactions  . Actos [Pioglitazone] Other (See Comments)    Slow HR and pain in face   . Anesthetics, Halogenated     Slept too long  . Crestor [Rosuvastatin Calcium]     Pain   . Sulfa Antibiotics Other (See Comments)    hallucinations  . Dye Fdc Red [Red Dye] Rash    # 40    Social History:   Social History   Socioeconomic History  . Marital status: Married    Spouse name: Not on file  . Number of children: 3  . Years of education: Associates  . Highest education level: Not on file  Social Needs  . Financial resource strain: Not on file  . Food insecurity - worry: Not on file  . Food insecurity - inability: Not on file  . Transportation needs - medical: Not on file  . Transportation needs - non-medical: Not on file  Occupational History  . Occupation: Retired   Tobacco Use  . Smoking status: Never Smoker  . Smokeless tobacco: Never Used  Substance and Sexual Activity  . Alcohol use: No    Alcohol/week: 0.0 oz  . Drug use: No  . Sexual activity: Not on file  Other Topics Concern  . Not on file  Social History Narrative   Drinks 1-2 cups of coffee a day  Family History:    Family History  Problem Relation Age of Onset  . Stroke Mother   . Hypertension Mother   . Heart attack Father   . Deep vein thrombosis Father   . Crohn's disease Brother   . Schizophrenia Sister      ROS:  Please see the history of present illness.  ROS  Patient denies fevers, chills, productive cough, hemoptysis, melena, hematochezia.  All remaining systems are negative.  Physical Exam/Data:   Vitals:   07/25/17 1345 07/25/17 1400 07/25/17 1415 07/25/17 1430  BP: 100/64 102/78 99/70 123/89  Pulse: 95 96 94 (!) 103  Resp: (!) 27 (!) 25 (!) 21 (!) 34  Temp:      TempSrc:      SpO2: 95% 95% 97% 97%  Weight:       Height:       No intake or output data in the 24 hours ending 07/25/17 1625 Filed Weights   07/25/17 1207  Weight: 215 lb (97.5 kg)   Body mass index is 33.67 kg/m.  General:  Well nourished, obese in no acute distress HEENT: normal Lymph: no adenopathy Neck: JVP 10 cm Endocrine:  No thryomegaly Vascular: No carotid bruits; FA pulses 2+ bilaterally without bruits  Cardiac:  normal S1, S2; RRR; 2/6 systolic murmur apex Lungs:  Mild diffuse expiratory wheeze Abd: soft, nontender, no hepatomegaly  Ext: 1+ edema Musculoskeletal:  No deformities, BUE and BLE strength normal and equal Skin: warm and dry  Neuro:  CNs 2-12 intact, no focal abnormalities noted Psych:  Normal affect   EKG:  The EKG was personally reviewed and demonstrates: Sinus tachycardia, frequent PVCs, nonspecific ST changes.   Laboratory Data:  Chemistry Recent Labs  Lab 07/25/17 1208  NA 128*  K 5.2*  CL 95*  CO2 21*  GLUCOSE 187*  BUN 30*  CREATININE 1.19  CALCIUM 8.9  GFRNONAA 56*  GFRAA >60  ANIONGAP 12    Recent Labs  Lab 07/25/17 1208  PROT 6.6  ALBUMIN 3.5  AST 66*  ALT 63  ALKPHOS 58  BILITOT 1.2   Hematology Recent Labs  Lab 07/25/17 1208  WBC 12.0*  RBC 4.78  HGB 13.2  HCT 38.5*  MCV 80.5  MCH 27.6  MCHC 34.3  RDW 13.2  PLT 258   Recent Labs  Lab 07/25/17 1224  TROPIPOC 0.10*    BNP Recent Labs  Lab 07/25/17 1208  BNP 1,864.1*    Radiology/Studies:  Dg Chest 2 View  Result Date: 07/25/2017 CLINICAL DATA:  Shortness of Breath EXAM: CHEST  2 VIEW COMPARISON:  05/27/2016 FINDINGS: Low lung volumes with bibasilar atelectasis. Heart is mildly enlarged. No significant effusions. No acute bony abnormality. IMPRESSION: Borderline cardiomegaly. Low lung volumes with bibasilar atelectasis. Electronically Signed   By: Rolm Baptise M.D.   On: 07/25/2017 13:05    Assessment and Plan:   1. Acute on chronic systolic congestive heart failure-patient presents with  symptoms of congestive heart failure.  He is volume overloaded on examination.  BNP elevated.  Begin Lasix 40 mg IV twice daily.  Follow renal function closely.  Patient will need low-sodium diet.  Follow I's and O's and daily weights.  Repeat echocardiogram. 2. Nonischemic cardiomyopathy-will add losartan 25 mg daily.  No beta-blocker at this point given acute congestive heart failure.  We will add low-dose carvedilol as blood pressure and pulse tolerate once CHF improves.  Blood pressure will likely not allow Entresto. 3. Hyponatremia-likely from congestive heart failure.  Fluid restrict to 1500 cc daily. 4. Hyperkalemia-we will recheck tomorrow morning. 5. Mildly elevated troponin-likely secondary to congestive heart failure.  Will cycle and if no trend no further ischemia evaluation.  Prior catheterization revealed no coronary disease. 6. History of DVT-continue Coumadin.   For questions or updates, please contact [place] Please consult [email or web address] for contact info under Cardiology/STEMI.   Signed, Kirk Ruths, MD  07/25/2017 4:25 PM

## 2017-07-25 NOTE — ED Provider Notes (Signed)
[facility] EMERGENCY DEPARTMENT Provider Note   CSN: [id number] Arrival date & time: 07/25/17  1152     History   Chief Complaint Chief Complaint  Patient presents with  . Shortness of Breath    HPI Michael Randolph is a 83 y.o. male.  The history is provided by the patient, medical records and a relative.  Shortness of Breath  This is a new problem. The average episode lasts 1 week. The problem occurs continuously.The current episode started more than 1 week ago. The problem has been gradually worsening. Associated symptoms include leg swelling. Pertinent negatives include no fever, no headaches, no coryza, no rhinorrhea, no neck pain, no cough, no sputum production, no hemoptysis, no wheezing, no chest pain, no syncope, no vomiting, no abdominal pain, no rash and no leg pain. He has tried nothing for the symptoms. The treatment provided no relief. Associated medical issues include heart failure and DVT.    Past Medical History:  Diagnosis Date  . Congestive heart failure (CHF) ([place])   . Diabetes mellitus without complication (HCC)    no medications  . Diverticulosis   . DVT (deep venous thrombosis) ([place])   . Dyspnea   . Dysuria   . Heart disease   . Hyperlipemia   . Kidney stone   . Stroke ([place])   . TIA (transient ischemic attack) 2008   was seen on CT   . Toe infection     Patient Active Problem List   Diagnosis Date Noted  . Mixed hyperlipidemia 03/11/2017  . NSVT (nonsustained ventricular tachycardia) ([place]) 05/19/2016  . Chronic systolic CHF (congestive heart failure) ([place]) 05/19/2016  . Sepsis ([place]) 05/16/2016  . CAP (community acquired pneumonia) 05/16/2016  . AKI (acute kidney injury) ([place]) 05/16/2016  . Elevated troponin   . NSTEMI (non-ST elevated myocardial infarction) ([place])   . Diabetes mellitus without complication ([place]) 37/16/9678  . DVT (deep venous thrombosis) ([place])   . TIA (transient ischemic attack)     Past Surgical  History:  Procedure Laterality Date  . CARDIAC CATHETERIZATION N/A 12/01/2014   Procedure: Right/Left Heart Cath and Coronary Angiography;  Surgeon: Jettie Booze, MD;  Location: [facility];  Service: Cardiovascular;  Laterality: N/A;  . COLONOSCOPY  2013  . ROTATOR CUFF REPAIR Right        Home Medications    Prior to Admission medications   Medication Sig Start Date End Date Taking? Authorizing Provider  aspirin 81 MG tablet Take 81 mg by mouth at bedtime.     [provider]  Cholecalciferol 2000 UNITS CAPS Take 1 capsule by mouth 2 (two) times daily.     [provider]  fenofibrate (TRICOR) 145 MG tablet Take 1 tablet (145 mg total) by mouth daily. 03/11/17   Nahser, Wonda Cheng, MD  furosemide (LASIX) 20 MG tablet Take 1 tablet (20 mg total) by mouth daily as needed for fluid or edema. May use extra prn weight gain 06/13/16   Nahser, Wonda Cheng, MD  GARLIC PO Take 1 tablet by mouth daily.    [provider]  losartan (COZAAR) 25 MG tablet Take 1 tablet (25 mg total) by mouth daily. 06/13/16   Nahser, Wonda Cheng, MD  Magnesium 250 MG TABS Take 1 tablet by mouth 2 (two) times daily.     [provider]  Methylsulfonylmethane (MSM) 1000 MG CAPS Take 1,000 mg by mouth daily.     [provider]  metoprolol succinate (TOPROL-XL)  25 MG 24 hr tablet Take 0.5 tablets (12.5 mg total) by mouth daily. Take with or immediately following a meal. 06/13/16 03/11/17  Nahser, Wonda Cheng, MD  warfarin (COUMADIN) 5 MG tablet Take 2.5-5 mg by mouth at bedtime. Takes 5mg  everyday except on Monday's takes 2.5mg     [provider]    Family History Family History  Problem Relation Age of Onset  . Stroke Mother   . Hypertension Mother   . Heart attack Father   . Deep vein thrombosis Father   . Crohn's disease Brother   . Schizophrenia Sister     Social History Social History   Tobacco Use  . Smoking status: Never Smoker  . Smokeless  tobacco: Never Used  Substance Use Topics  . Alcohol use: No    Alcohol/week: 0.0 oz  . Drug use: No     Allergies   Actos [pioglitazone]; Anesthetics, halogenated; Crestor [rosuvastatin calcium]; Sulfa antibiotics; and Dye fdc red [red dye]   Review of Systems Review of Systems  Constitutional: Positive for fatigue. Negative for chills, diaphoresis and fever.  HENT: Negative for rhinorrhea.   Eyes: Negative for visual disturbance.  Respiratory: Positive for chest tightness and shortness of breath. Negative for cough, hemoptysis, sputum production, wheezing and stridor.   Cardiovascular: Positive for leg swelling. Negative for chest pain and syncope.  Gastrointestinal: Negative for abdominal pain, constipation, diarrhea, nausea and vomiting.  Genitourinary: Negative for dysuria and flank pain.  Musculoskeletal: Negative for back pain, neck pain and neck stiffness.  Skin: Negative for rash and wound.  Neurological: Positive for light-headedness. Negative for dizziness, weakness and headaches.  Psychiatric/Behavioral: Negative for agitation.  All other systems reviewed and are negative.    Physical Exam Updated Vital Signs BP 115/83   Pulse 91   Temp 97.6 F (36.4 C) (Oral)   Resp 18   Ht 5\' 7"  (1.702 m)   Wt 97.5 kg (215 lb)   SpO2 95%   BMI 33.67 kg/m   Physical Exam  Constitutional: He is oriented to person, place, and time. He appears well-developed and well-nourished.  Non-toxic appearance. He appears ill. No distress.  HENT:  Head: Normocephalic and atraumatic.  Mouth/Throat: Oropharynx is clear and moist. No oropharyngeal exudate.  Eyes: Conjunctivae and EOM are normal. Pupils are equal, round, and reactive to light.  Neck: Normal range of motion.  Cardiovascular: Normal rate and intact distal pulses.  No murmur heard. Pulmonary/Chest: Effort normal. Tachypnea noted. No respiratory distress. He has no decreased breath sounds. He has no wheezes. He has no  rhonchi. He has rales. He exhibits no tenderness.  Abdominal: Soft. Bowel sounds are normal. There is no tenderness.  Musculoskeletal: He exhibits edema. He exhibits no tenderness.       Right lower leg: He exhibits edema.       Left lower leg: He exhibits edema.  Neurological: He is alert and oriented to person, place, and time. No sensory deficit. He exhibits normal muscle tone.  Skin: Capillary refill takes less than 2 seconds. He is not diaphoretic. No erythema. No pallor.  Psychiatric: He has a normal mood and affect.  Nursing note and vitals reviewed.    ED Treatments / Results  Labs (all labs ordered are listed, but only abnormal results are displayed) Labs Reviewed  BASIC METABOLIC PANEL - Abnormal; Notable for the following components:      Result Value   Sodium 128 (*)    Potassium 5.2 (*)    Chloride 95 (*)  CO2 21 (*)    Glucose, Bld 187 (*)    BUN 30 (*)    GFR calc non Af Amer 56 (*)    All other components within normal limits  CBC - Abnormal; Notable for the following components:   WBC 12.0 (*)    HCT 38.5 (*)    All other components within normal limits  BRAIN NATRIURETIC PEPTIDE - Abnormal; Notable for the following components:   B Natriuretic Peptide 1,864.1 (*)    All other components within normal limits  HEPATIC FUNCTION PANEL - Abnormal; Notable for the following components:   AST 66 (*)    All other components within normal limits  PROTIME-INR - Abnormal; Notable for the following components:   Prothrombin Time 29.0 (*)    All other components within normal limits  I-STAT TROPONIN, ED - Abnormal; Notable for the following components:   Troponin i, poc 0.10 (*)    All other components within normal limits  LIPASE, BLOOD  PROTIME-INR  CBC  BASIC METABOLIC PANEL  TROPONIN I  TROPONIN I  TROPONIN I    EKG  EKG Interpretation  Date/Time:  Saturday July 25 2017 11:56:58 EST Ventricular Rate:  104 PR Interval:  174 QRS Duration: 102 QT  Interval:  404 QTC Calculation: 531 R Axis:   82 Text Interpretation:  Sinus tachycardia with occasional Premature ventricular complexes T wave abnormality, consider inferior ischemia Abnormal ECG When compared to prior, no significant changes seen.  No STEMI Confirmed by Antony Blackbird [phone]) on 07/25/2017 12:46:17 PM       Radiology Dg Chest 2 View  Result Date: 07/25/2017 CLINICAL DATA:  Shortness of Breath EXAM: CHEST  2 VIEW COMPARISON:  05/27/2016 FINDINGS: Low lung volumes with bibasilar atelectasis. Heart is mildly enlarged. No significant effusions. No acute bony abnormality. IMPRESSION: Borderline cardiomegaly. Low lung volumes with bibasilar atelectasis. Electronically Signed   By: Rolm Baptise M.D.   On: 07/25/2017 13:05    Procedures Procedures (including critical care time)  Medications Ordered in ED Medications  losartan (COZAAR) tablet 25 mg (not administered)  cholecalciferol (VITAMIN D) tablet 2,000 Units (not administered)  sodium chloride (OCEAN) 0.65 % nasal spray 1 spray (not administered)  warfarin (COUMADIN) tablet 2.5-5 mg (not administered)  sodium chloride flush (NS) 0.9 % injection 3 mL (not administered)  sodium chloride flush (NS) 0.9 % injection 3 mL (not administered)  0.9 %  sodium chloride infusion (not administered)  acetaminophen (TYLENOL) tablet 650 mg (not administered)  ondansetron (ZOFRAN) injection 4 mg (not administered)  furosemide (LASIX) injection 40 mg (40 mg Intravenous Given 07/25/17 1831)  Warfarin - Pharmacist Dosing Inpatient (1 each Does not apply Given 07/25/17 1831)  warfarin (COUMADIN) tablet 5 mg (5 mg Oral Given 07/25/17 1831)     Initial Impression / Assessment and Plan / ED Course  I have reviewed the triage vital signs and the nursing notes.  Pertinent labs & imaging results that were available during my care of the patient were reviewed by me and considered in my medical decision making (see chart for details).      Michael Randolph is a 83 y.o. male with a past medical history significant for CHF, DVT on Coumadin therapy, prior TIA, diabetes, and prior NSTEMI who presents for worsening shortness of breath and generalized edema.  Patient reports that for the last week or 2 he has had worsening shortness of breath and generalized swelling.  He reports that he called his PCP who instructed  him to take more Lasix however family is concerned that this is not helping.  Patient reports that he can barely move without being winded.  He has not been able to lay flat or sleep at night due to the shortness of breath.  He reports he has had some cough but denies fevers or chills.  He denies chest pain but does report some chest tightness.  He denies unilateral leg pain or leg swelling.  He denies recent trauma.  He denies nausea, vomiting, diaphoresis, or palpitations.  On exam, patient had significant rales in his lungs.  Patient had no wheezing on my examination.  Chest was nontender and abdomen was nontender.  Patient had pitting edema in both legs going up bilaterally.  No tenderness of the legs.  No focal neurologic deficits and patient had pulses in all extremities.  Clinically, patient has a presentation concerning for fluid overload in the setting of CHF exacerbation.  Initial EKG did not show evidence of STEMI.  Patient was noted to have worsening ectopy with PVCs and occasional bigeminy and trigeminy.  Initial troponin was positive.  BNP found to be elevated at greater than 1800.  Chest x-ray showed cardiomegaly with some atelectasis however clinically there is significant concern for fluid.  Cardiology was called who will admit the patient for further management.  Patient admitted in stable condition.     Final Clinical Impressions(s) / ED Diagnoses   Final diagnoses:  Shortness of breath  Acute on chronic congestive heart failure, unspecified heart failure type (HCC)    Clinical Impression: 1.  Shortness of breath   2. Acute on chronic congestive heart failure, unspecified heart failure type ([place])     Disposition: Admit  This note was prepared with assistance of Dragon voice recognition software. Occasional wrong-word or sound-a-like substitutions may have occurred due to the inherent limitations of voice recognition software.      Toleen Lachapelle, Gwenyth Allegra, MD 07/25/17 2019

## 2017-07-26 ENCOUNTER — Inpatient Hospital Stay (HOSPITAL_COMMUNITY): Payer: Medicare Other

## 2017-07-26 DIAGNOSIS — I509 Heart failure, unspecified: Secondary | ICD-10-CM

## 2017-07-26 LAB — ECHOCARDIOGRAM COMPLETE
HEIGHTINCHES: 66.5 in
Weight: 3516.8 oz

## 2017-07-26 LAB — BASIC METABOLIC PANEL
ANION GAP: 13 (ref 5–15)
BUN: 33 mg/dL — ABNORMAL HIGH (ref 6–20)
CHLORIDE: 94 mmol/L — AB (ref 101–111)
CO2: 20 mmol/L — ABNORMAL LOW (ref 22–32)
CREATININE: 1.19 mg/dL (ref 0.61–1.24)
Calcium: 9 mg/dL (ref 8.9–10.3)
GFR calc non Af Amer: 56 mL/min — ABNORMAL LOW (ref >60)
Glucose, Bld: 131 mg/dL — ABNORMAL HIGH (ref 65–99)
POTASSIUM: 4.2 mmol/L (ref 3.5–5.1)
Sodium: 127 mmol/L — ABNORMAL LOW (ref 135–145)

## 2017-07-26 LAB — CBC
HEMATOCRIT: 37.2 % — AB (ref 39.0–52.0)
HEMOGLOBIN: 12.5 g/dL — AB (ref 13.0–17.0)
MCH: 26.9 pg (ref 26.0–34.0)
MCHC: 33.6 g/dL (ref 30.0–36.0)
MCV: 80.2 fL (ref 78.0–100.0)
Platelets: 256 10*3/uL (ref 150–400)
RBC: 4.64 MIL/uL (ref 4.22–5.81)
RDW: 13.4 % (ref 11.5–15.5)
WBC: 10.9 10*3/uL — ABNORMAL HIGH (ref 4.0–10.5)

## 2017-07-26 LAB — MAGNESIUM: MAGNESIUM: 1.9 mg/dL (ref 1.7–2.4)

## 2017-07-26 LAB — PROTIME-INR
INR: 2.38
PROTHROMBIN TIME: 25.8 s — AB (ref 11.4–15.2)

## 2017-07-26 LAB — TROPONIN I
TROPONIN I: 0.11 ng/mL — AB (ref <0.03)
TROPONIN I: 0.13 ng/mL — AB (ref <0.03)

## 2017-07-26 MED ORDER — LOSARTAN POTASSIUM 25 MG PO TABS: 12.5000 mg | ORAL_TABLET | Freq: Every day | ORAL | Status: DC

## 2017-07-26 MED ORDER — WARFARIN SODIUM 5 MG PO TABS
5.0000 mg | ORAL_TABLET | Freq: Once | ORAL | Status: AC
  Administered 2017-07-26: 5 mg via ORAL
  Filled 2017-07-26: qty 1

## 2017-07-26 MED ORDER — PERFLUTREN LIPID MICROSPHERE
1.0000 mL | INTRAVENOUS | Status: AC | PRN
  Administered 2017-07-26: 2 mL via INTRAVENOUS
  Filled 2017-07-26: qty 10

## 2017-07-26 NOTE — Progress Notes (Signed)
  Echocardiogram 2D Echocardiogram has been performed.  Ethylene Reznick T [place] Hair 07/26/2017, 2:18 PM

## 2017-07-26 NOTE — Progress Notes (Addendum)
Progress Note  Patient Name: Michael Randolph Date of Encounter: 07/26/2017  Primary Cardiologist: Dr Acie Fredrickson  Subjective   Still SOB  No CP    Inpatient Medications    Scheduled Meds: . cholecalciferol  2,000 Units Oral BID  . furosemide  40 mg Intravenous BID  . losartan  25 mg Oral Daily  . sodium chloride flush  3 mL Intravenous Q12H  . warfarin  2.5-5 mg Oral See admin instructions  . Warfarin - Pharmacist Dosing Inpatient   Does not apply q1800   Continuous Infusions: . sodium chloride     PRN Meds: sodium chloride, acetaminophen, ondansetron (ZOFRAN) IV, sodium chloride, sodium chloride flush   Vital Signs    Vitals:   07/25/17 1753 07/25/17 1935 07/26/17 0005 07/26/17 0410  BP: 114/77 116/64 97/82 96/74   Pulse: (!) 101 76 86 92  Resp: 20 20 20 20   Temp: 97.8 F (36.6 C) (!) 97.5 F (36.4 C) (!) 97.3 F (36.3 C) 98 F (36.7 C)  TempSrc: Oral Oral Oral Oral  SpO2: 97% 93% 94% 95%  Weight: 220 lb 14.4 oz (100.2 kg)   219 lb 12.8 oz (99.7 kg)  Height: 5' 6.5" (1.689 m)       Intake/Output Summary (Last 24 hours) at 07/26/2017 0800 Last data filed at 07/26/2017 0500 Gross per 24 hour  Intake 240 ml  Output 1410 ml  Net -1170 ml   Filed Weights   07/25/17 1207 07/25/17 1753 07/26/17 0410  Weight: 215 lb (97.5 kg) 220 lb 14.4 oz (100.2 kg) 219 lb 12.8 oz (99.7 kg)    Telemetry    Frequent PVCs  SR - Personally Reviewed   Physical Exam   GEN: No acute distress.   Neck: No JVD Cardiac: RRR, II/VI systolic murmur apex Respiratory: MIld wheeze   GI: Soft, nontender, non-distended  MS: 1+ edema Neuro:  Nonfocal  Psych: Normal affect   Labs    Chemistry Recent Labs  Lab 07/25/17 1208  NA 128*  K 5.2*  CL 95*  CO2 21*  GLUCOSE 187*  BUN 30*  CREATININE 1.19  CALCIUM 8.9  PROT 6.6  ALBUMIN 3.5  AST 66*  ALT 63  ALKPHOS 58  BILITOT 1.2  GFRNONAA 56*  GFRAA >60  ANIONGAP 12     Hematology Recent Labs  Lab 07/25/17 1208  WBC  12.0*  RBC 4.78  HGB 13.2  HCT 38.5*  MCV 80.5  MCH 27.6  MCHC 34.3  RDW 13.2  PLT 258    Cardiac Enzymes Recent Labs  Lab 07/25/17 1824 07/26/17 0032  TROPONINI 0.10* 0.13*    Recent Labs  Lab 07/25/17 1224  TROPIPOC 0.10*     BNP Recent Labs  Lab 07/25/17 1208  BNP 1,864.1*     Radiology    Dg Chest 2 View  Result Date: 07/25/2017 CLINICAL DATA:  Shortness of Breath EXAM: CHEST  2 VIEW COMPARISON:  05/27/2016 FINDINGS: Low lung volumes with bibasilar atelectasis. Heart is mildly enlarged. No significant effusions. No acute bony abnormality. IMPRESSION: Borderline cardiomegaly. Low lung volumes with bibasilar atelectasis. Electronically Signed   By: Rolm Baptise M.D.   On: 07/25/2017 13:05    Patient Profile     83 y.o. male with past medical history of nonischemic cardiomyopathy admitted with acute on chronic systolic congestive heart failure.  Assessment & Plan  1.  Acute on chronic systolic congestive heart failure-symptoms mildly improved; I/O-1170. Continue Lasix 40 mg IV twice daily.  Follow  renal function closely.  continue low-sodium diet.  Follow I's and O's and daily weights. Await repeat echo. 2. Nonischemic cardiomyopathy-BP borderline; continue losartan 25 mg daily.  No beta-blocker at this point given acute congestive heart failure.  We will add low-dose carvedilol as blood pressure and pulse tolerate once CHF improves.  Blood pressure will likely not allow Entresto. 3. Hyponatremia-likely from CHF.  Fluid restrict to 1500 cc daily. BMET this AM pending. 4. Hyperkalemia-await fu results 5. Mildly elevated troponin-likely secondary to congestive heart failure.  No chest pain. No clear trend. Previous cath with no significant CAD; no further ischemia eval.  6. History of DVT-continue Coumadin.     For questions or updates, please contact [place] Please consult [email or web address] for contact info under Cardiology/STEMI.      Signed, Kirk Ruths, MD  07/26/2017, 8:00 AM

## 2017-07-26 NOTE — Progress Notes (Signed)
[facility] for warfarin Indication: DVT  Allergies  Allergen Reactions  . Actos [Pioglitazone] Other (See Comments)    Slow HR and pain in face   . Anesthetics, Halogenated     Slept too long  . Crestor [Rosuvastatin Calcium]     Pain   . Sulfa Antibiotics Other (See Comments)    hallucinations  . Dye Fdc Red [Red Dye] Rash    # 40    Patient Measurements: Height: 5' 6.5" (168.9 cm) Weight: 219 lb 12.8 oz (99.7 kg)(scale a) IBW/kg (Calculated) : 64.95  Vital Signs: Temp: 97.3 F (36.3 C) (01/13 1125) Temp Source: Oral (01/13 1125) BP: 81/61 (01/13 1254) Pulse Rate: 102 (01/13 1254)  Labs: Recent Labs    07/25/17 1208 07/25/17 1824 07/26/17 0032 07/26/17 0737  HGB 13.2  --   --  12.5*  HCT 38.5*  --   --  37.2*  PLT 258  --   --  256  LABPROT 29.0*  --   --  25.8*  INR 2.76  --   --  2.38  CREATININE 1.19  --   --  1.19  TROPONINI  --  0.10* 0.13* 0.11*    Estimated Creatinine Clearance: 55.3 mL/min (by C-G formula based on SCr of 1.19 mg/dL).   Medical History: Past Medical History:  Diagnosis Date  . Congestive heart failure (CHF) ([place])   . Diabetes mellitus without complication (HCC)    no medications  . Diverticulosis   . DVT (deep venous thrombosis) ([place])   . Heart disease   . Hyperlipemia   . Kidney stone   . NICM (nonischemic cardiomyopathy) ([place])   . OSA (obstructive sleep apnea)   . Stroke ([place])   . TIA (transient ischemic attack) 2008   was seen on CT   . Toe infection    Assessment: 11 yom being evaluated for acute on chronic CHF, on warfarin PTA for chronic DVTs. Home regimen is 5 mg daily except 2.5 mg on MWF. Following with Dr Oretha Ellis at [facility]. Last dose was 2.5 mg on 1/11.  INR on admission 2.76. INR decreased to 2.38. Hgb is down slightly to 12.5 and platelets are stable at 256. No signs/symptoms of bleeding.   Goal of Therapy:  INR 2-3 Monitor platelets by  anticoagulation protocol: Yes   Plan:  Order warfarin 5 mg tonight Monitor daily INR and CBC  Doylene Canard, PharmD Clinical Pharmacist  Pager: [phone] Clinical Phone for 07/26/2017 until 3:30pm: [phone] If after 3:30pm, please call main pharmacy at [phone] 07/26/2017,1:08 PM

## 2017-07-26 NOTE — Progress Notes (Signed)
  Pt troponin 0.13. No signs distress noted. MD on call notified.

## 2017-07-26 NOTE — Progress Notes (Signed)
Patient with low BP in the 80's. Pt complains of slight dizziness. Cardiology notified. Order given to give a cup of water and monitor BP and notify if worsening symptoms occur.

## 2017-07-27 DIAGNOSIS — E871 Hypo-osmolality and hyponatremia: Secondary | ICD-10-CM

## 2017-07-27 DIAGNOSIS — R748 Abnormal levels of other serum enzymes: Secondary | ICD-10-CM

## 2017-07-27 LAB — COOXEMETRY PANEL
CARBOXYHEMOGLOBIN: 1.3 % (ref 0.5–1.5)
Methemoglobin: 0.8 % (ref 0.0–1.5)
O2 SAT: 52.5 %
TOTAL HEMOGLOBIN: 12.5 g/dL (ref 12.0–16.0)

## 2017-07-27 LAB — BASIC METABOLIC PANEL
ANION GAP: 11 (ref 5–15)
BUN: 36 mg/dL — ABNORMAL HIGH (ref 6–20)
CALCIUM: 9 mg/dL (ref 8.9–10.3)
CO2: 21 mmol/L — ABNORMAL LOW (ref 22–32)
CREATININE: 1.11 mg/dL (ref 0.61–1.24)
Chloride: 94 mmol/L — ABNORMAL LOW (ref 101–111)
GFR calc non Af Amer: >60 mL/min (ref >60)
Glucose, Bld: 121 mg/dL — ABNORMAL HIGH (ref 65–99)
Potassium: 3.9 mmol/L (ref 3.5–5.1)
SODIUM: 126 mmol/L — AB (ref 135–145)

## 2017-07-27 LAB — CBC
HCT: 35.9 % — ABNORMAL LOW (ref 39.0–52.0)
Hemoglobin: 12.2 g/dL — ABNORMAL LOW (ref 13.0–17.0)
MCH: 26.9 pg (ref 26.0–34.0)
MCHC: 34 g/dL (ref 30.0–36.0)
MCV: 79.1 fL (ref 78.0–100.0)
Platelets: 206 10*3/uL (ref 150–400)
RBC: 4.54 MIL/uL (ref 4.22–5.81)
RDW: 13.6 % (ref 11.5–15.5)
WBC: 9.3 10*3/uL (ref 4.0–10.5)

## 2017-07-27 LAB — PROTIME-INR
INR: 2.31
PROTHROMBIN TIME: 25.2 s — AB (ref 11.4–15.2)

## 2017-07-27 MED ORDER — MILRINONE LACTATE IN DEXTROSE 20-5 MG/100ML-% IV SOLN
0.1250 ug/kg/min | INTRAVENOUS | Status: DC
  Administered 2017-07-27 – 2017-07-30 (×7): 0.25 ug/kg/min via INTRAVENOUS
  Administered 2017-07-31 – 2017-08-02 (×3): 0.125 ug/kg/min via INTRAVENOUS
  Filled 2017-07-27 (×10): qty 100

## 2017-07-27 MED ORDER — FUROSEMIDE 10 MG/ML IJ SOLN
80.0000 mg | Freq: Once | INTRAMUSCULAR | Status: AC
  Administered 2017-07-27: 80 mg via INTRAVENOUS
  Filled 2017-07-27: qty 8

## 2017-07-27 MED ORDER — WARFARIN SODIUM 2.5 MG PO TABS
2.5000 mg | ORAL_TABLET | Freq: Once | ORAL | Status: AC
  Administered 2017-07-27: 2.5 mg via ORAL
  Filled 2017-07-27: qty 1

## 2017-07-27 MED ORDER — SODIUM CHLORIDE 0.9% FLUSH
10.0000 mL | INTRAVENOUS | Status: DC | PRN
  Administered 2017-07-28 – 2017-07-31 (×3): 10 mL
  Filled 2017-07-27 (×3): qty 40

## 2017-07-27 NOTE — Progress Notes (Signed)
MEDICATION RELATED CONSULT NOTE - INITIAL   Pharmacy Consult for milrinone  Indication: acute inotropic support  Allergies  Allergen Reactions  . Actos [Pioglitazone] Other (See Comments)    Slow HR and pain in face   . Anesthetics, Halogenated     Slept too long  . Crestor [Rosuvastatin Calcium]     Pain   . Sulfa Antibiotics Other (See Comments)    hallucinations  . Dye Fdc Red [Red Dye] Rash    # 40    Patient Measurements: Height: 5' 6.5" (168.9 cm) Weight: 221 lb 8 oz (100.5 kg)(scale a) IBW/kg (Calculated) : 64.95  Vital Signs: Temp: 97.7 F (36.5 C) (01/14 0451) Temp Source: Oral (01/14 0451) BP: 120/60 (01/14 1532) Pulse Rate: 93 (01/14 1532) Intake/Output from previous day: 01/13 0701 - 01/14 0700 In: 960 [P.O.:960] Out: 771 [Urine:770; Stool:1] Intake/Output from this shift: Total I/O In: 840 [P.O.:840] Out: -   Labs: Recent Labs    07/25/17 1208 07/26/17 0032 07/26/17 0737 07/27/17 0556  WBC 12.0*  --  10.9* 9.3  HGB 13.2  --  12.5* 12.2*  HCT 38.5*  --  37.2* 35.9*  PLT 258  --  256 206  CREATININE 1.19  --  1.19 1.11  MG  --  1.9  --   --   ALBUMIN 3.5  --   --   --   PROT 6.6  --   --   --   AST 66*  --   --   --   ALT 63  --   --   --   ALKPHOS 58  --   --   --   BILITOT 1.2  --   --   --   BILIDIR 0.3  --   --   --   IBILI 0.9  --   --   --    Estimated Creatinine Clearance: 59.5 mL/min (by C-G formula based on SCr of 1.11 mg/dL).   Microbiology: No results found for this or any previous visit (from the past 720 hour(s)).  Medical History: Past Medical History:  Diagnosis Date  . Congestive heart failure (CHF) ([place])   . Diabetes mellitus without complication (HCC)    no medications  . Diverticulosis   . DVT (deep venous thrombosis) ([place])   . Heart disease   . Hyperlipemia   . Kidney stone   . NICM (nonischemic cardiomyopathy) ([place])   . OSA (obstructive sleep apnea)   . Stroke ([place])   . TIA (transient ischemic attack)  2008   was seen on CT   . Toe infection     Assessment: 56 yom with advancing heart failure, EF 25%, coox 52%. New orders to start milrinone. Renal function normal at 1.1, bp was low earlier but last reading was within normal limits.   Will follow vitals and renal function closely for adjustments.   Plan:  Start Milrinone 0.13mcg/kg/min MD to make further adjustments as needed  Erin Hearing PharmD., BCPS Clinical Pharmacist 07/27/2017 4:39 PM

## 2017-07-27 NOTE — Progress Notes (Signed)
  Patient had a quiet night no signs distress to report.  Patient denies any periods of dizziness during night.

## 2017-07-27 NOTE — Progress Notes (Signed)
IV team RN has re-collected coox and is walking the blood sample down to respiratory lab to be read.

## 2017-07-27 NOTE — Progress Notes (Signed)
Cardiology notified that coox results are in.

## 2017-07-27 NOTE — Progress Notes (Addendum)
RN attempting to figure out why coox blood draw has not been read yet. Received phone call that blood has been sitting in lab tray in respiratory lab and staff was unaware of blood waiting to be tested. Respiratory lab staff informed me that blood would have to be redrawn. STAT IV team consult replaced for another lab draw from PICC line.

## 2017-07-27 NOTE — Plan of Care (Signed)
  Progressing Pain Managment: General experience of comfort will improve 07/27/2017 0315 - Progressing by Ardine Eng, RN

## 2017-07-27 NOTE — Progress Notes (Signed)
[facility] for warfarin Indication: DVT  Allergies  Allergen Reactions  . Actos [Pioglitazone] Other (See Comments)    Slow HR and pain in face   . Anesthetics, Halogenated     Slept too long  . Crestor [Rosuvastatin Calcium]     Pain   . Sulfa Antibiotics Other (See Comments)    hallucinations  . Dye Fdc Red [Red Dye] Rash    # 40    Patient Measurements: Height: 5' 6.5" (168.9 cm) Weight: 221 lb 8 oz (100.5 kg)(scale a) IBW/kg (Calculated) : 64.95  Vital Signs: Temp: 97.7 F (36.5 C) (01/14 0451) Temp Source: Oral (01/14 0451) BP: 86/46 (01/14 0731) Pulse Rate: 90 (01/14 0451)  Labs: Recent Labs    07/25/17 1208 07/25/17 1824 07/26/17 0032 07/26/17 0737 07/27/17 0556  HGB 13.2  --   --  12.5* 12.2*  HCT 38.5*  --   --  37.2* 35.9*  PLT 258  --   --  256 206  LABPROT 29.0*  --   --  25.8* 25.2*  INR 2.76  --   --  2.38 2.31  CREATININE 1.19  --   --  1.19 1.11  TROPONINI  --  0.10* 0.13* 0.11*  --     Estimated Creatinine Clearance: 59.5 mL/min (by C-G formula based on SCr of 1.11 mg/dL).   Medical History: Past Medical History:  Diagnosis Date  . Congestive heart failure (CHF) ([place])   . Diabetes mellitus without complication (HCC)    no medications  . Diverticulosis   . DVT (deep venous thrombosis) ([place])   . Heart disease   . Hyperlipemia   . Kidney stone   . NICM (nonischemic cardiomyopathy) ([place])   . OSA (obstructive sleep apnea)   . Stroke ([place])   . TIA (transient ischemic attack) 2008   was seen on CT   . Toe infection    Assessment: 89 yom being evaluated for acute on chronic CHF, on warfarin PTA for chronic DVTs. Home regimen is 5 mg daily except 2.5 mg on MWF. Following with Dr Oretha Ellis at [facility]. Last dose was 2.5 mg on 1/11 PTA.  INR remains therapeutic at 2.31. CBC stable. No signs/symptoms of bleeding.   Goal of Therapy:  INR 2-3 Monitor platelets by anticoagulation  protocol: Yes   Plan:  Warfarin 2.5 mg PO x 1 - continue home dose for now Monitor daily INR, CBC, s/sx bleeding  Elicia Lamp, PharmD, BCPS Clinical Pharmacist Clinical phone for 07/27/2017 until 3:30pm: [phone] If after 3:30pm, please call main pharmacy at: [phone] 07/27/2017 10:04 AM

## 2017-07-27 NOTE — Progress Notes (Signed)
Progress Note  Patient Name: Michael Randolph Date of Encounter: 07/27/2017  Primary Cardiologist: Dr Acie Fredrickson  Subjective   Still SOB   NO CP    Inpatient Medications    Scheduled Meds: . cholecalciferol  2,000 Units Oral BID  . furosemide  40 mg Intravenous BID  . losartan  12.5 mg Oral Daily  . sodium chloride flush  3 mL Intravenous Q12H  . Warfarin - Pharmacist Dosing Inpatient   Does not apply q1800   Continuous Infusions: . sodium chloride     PRN Meds: sodium chloride, acetaminophen, ondansetron (ZOFRAN) IV, sodium chloride, sodium chloride flush   Vital Signs    Vitals:   07/26/17 1935 07/27/17 0032 07/27/17 0451 07/27/17 0731  BP: 96/70 98/69 135/88 (!) 86/46  Pulse: 94 74 90   Resp: 18 18 18    Temp: 98.1 F (36.7 C) 97.6 F (36.4 C) 97.7 F (36.5 C)   TempSrc: Oral Oral Oral   SpO2: 99% 98% 97%   Weight:   221 lb 8 oz (100.5 kg)   Height:        Intake/Output Summary (Last 24 hours) at 07/27/2017 0813 Last data filed at 07/27/2017 0454 Gross per 24 hour  Intake 960 ml  Output 771 ml  Net 189 ml   Filed Weights   07/25/17 1753 07/26/17 0410 07/27/17 0451  Weight: 220 lb 14.4 oz (100.2 kg) 219 lb 12.8 oz (99.7 kg) 221 lb 8 oz (100.5 kg)    Telemetry    Sinus with frequent PVCS and 9 beats NSVT- Personally Reviewed   Physical Exam   GEN: No acute distress.   Neck: Neck full Cardiac: RRR, 2/6 systolic murmur apex Respiratory:   GI: Soft, nontender, non-distended  MS: 1+ edema Neuro:  Nonfocal  Psych: Normal affect   Labs    Chemistry Recent Labs  Lab 07/25/17 1208 07/26/17 0737 07/27/17 0556  NA 128* 127* 126*  K 5.2* 4.2 3.9  CL 95* 94* 94*  CO2 21* 20* 21*  GLUCOSE 187* 131* 121*  BUN 30* 33* 36*  CREATININE 1.19 1.19 1.11  CALCIUM 8.9 9.0 9.0  PROT 6.6  --   --   ALBUMIN 3.5  --   --   AST 66*  --   --   ALT 63  --   --   ALKPHOS 58  --   --   BILITOT 1.2  --   --   GFRNONAA 56* 56* >60  GFRAA >60 >60 >60    ANIONGAP 12 13 11      Hematology Recent Labs  Lab 07/25/17 1208 07/26/17 0737 07/27/17 0556  WBC 12.0* 10.9* 9.3  RBC 4.78 4.64 4.54  HGB 13.2 12.5* 12.2*  HCT 38.5* 37.2* 35.9*  MCV 80.5 80.2 79.1  MCH 27.6 26.9 26.9  MCHC 34.3 33.6 34.0  RDW 13.2 13.4 13.6  PLT 258 256 206    Cardiac Enzymes Recent Labs  Lab 07/25/17 1824 07/26/17 0032 07/26/17 0737  TROPONINI 0.10* 0.13* 0.11*    Recent Labs  Lab 07/25/17 1224  TROPIPOC 0.10*     BNP Recent Labs  Lab 07/25/17 1208  BNP 1,864.1*     Radiology    Dg Chest 2 View  Result Date: 07/25/2017 CLINICAL DATA:  Shortness of Breath EXAM: CHEST  2 VIEW COMPARISON:  05/27/2016 FINDINGS: Low lung volumes with bibasilar atelectasis. Heart is mildly enlarged. No significant effusions. No acute bony abnormality. IMPRESSION: Borderline cardiomegaly. Low lung volumes with bibasilar  atelectasis. Electronically Signed   By: Rolm Baptise M.D.   On: 07/25/2017 13:05    Patient Profile     83 y.o. male with past medical history of nonischemic cardiomyopathy admitted with acute on chronic systolic congestive heart failure.  Assessment & Plan    1. Acute on chronic systolic congestive heart failure- NICM  VOlume status is still up  Pt hypotensive (? If PVCs leading to inaccurate measurements)  On minimal meds  Would recomm PICC placement to eval COOX  May benefit from milrinone.  Hold losartan and lasix until placed  2. 2  PVCs  Consider amio to suppress (would be good to have holter to get burden before committing)    3  Moderate MR    3. Hyperkalemia  Improved   4. Hyponatremia-likely from CHF.  Fluid restrict to 1500 cc daily. BMET this AM pending. 5. Hyperkalemia-await fu results 6. Mildly elevated troponin-likely secondary to congestive heart failure.  No chest pain. No clear trend. Previous cath with no significant CAD; no further ischemia eval.  7. History of DVT-continue Coumadin.  Should not preclude  PICC     For questions or updates, please contact [place] Please consult [email or web address] for contact info under Cardiology/STEMI.      Signed, Dorris Carnes, MD  07/27/2017, 8:13 AM

## 2017-07-27 NOTE — Progress Notes (Signed)
Peripherally Inserted Central Catheter/Midline Placement  The IV Nurse has discussed with the patient and/or persons authorized to consent for the patient, the purpose of this procedure and the potential benefits and risks involved with this procedure.  The benefits include less needle sticks, lab draws from the catheter, and the patient may be discharged home with the catheter. Risks include, but not limited to, infection, bleeding, blood clot (thrombus formation), and puncture of an artery; nerve damage and irregular heartbeat and possibility to perform a PICC exchange if needed/ordered by physician.  Alternatives to this procedure were also discussed.  Bard Power PICC patient education guide, fact sheet on infection prevention and patient information card has been provided to patient /or left at bedside.    PICC/Midline Placement Documentation  PICC Double Lumen 70/96/28 PICC Right Basilic 41 cm 1 cm (Active)  Indication for Insertion or Continuance of Line Vasoactive infusions 07/27/2017 11:00 AM  Exposed Catheter (cm) 1 cm 07/27/2017 11:00 AM  Site Assessment Clean;Dry;Intact 07/27/2017 11:00 AM  Lumen #1 Status Flushed;Blood return noted 07/27/2017 11:00 AM  Lumen #2 Status Flushed;Blood return noted 07/27/2017 11:00 AM  Dressing Type Transparent 07/27/2017 11:00 AM  Dressing Status Clean;Dry;Intact;Antimicrobial disc in place 07/27/2017 11:00 AM  Dressing Change Due 08/03/17 07/27/2017 11:00 AM       Jule Economy [place] 07/27/2017, 11:12 AM

## 2017-07-28 DIAGNOSIS — I493 Ventricular premature depolarization: Secondary | ICD-10-CM

## 2017-07-28 LAB — BASIC METABOLIC PANEL
Anion gap: 12 (ref 5–15)
BUN: 32 mg/dL — AB (ref 6–20)
CHLORIDE: 94 mmol/L — AB (ref 101–111)
CO2: 23 mmol/L (ref 22–32)
Calcium: 8.7 mg/dL — ABNORMAL LOW (ref 8.9–10.3)
Creatinine, Ser: 1.26 mg/dL — ABNORMAL HIGH (ref 0.61–1.24)
GFR calc Af Amer: >60 mL/min (ref >60)
GFR calc non Af Amer: 52 mL/min — ABNORMAL LOW (ref >60)
GLUCOSE: 116 mg/dL — AB (ref 65–99)
POTASSIUM: 3.4 mmol/L — AB (ref 3.5–5.1)
Sodium: 129 mmol/L — ABNORMAL LOW (ref 135–145)

## 2017-07-28 LAB — COOXEMETRY PANEL
CARBOXYHEMOGLOBIN: 1.2 % (ref 0.5–1.5)
METHEMOGLOBIN: 1.1 % (ref 0.0–1.5)
O2 Saturation: 57.4 %
Total hemoglobin: 12.1 g/dL (ref 12.0–16.0)

## 2017-07-28 LAB — PROTIME-INR
INR: 2.5
Prothrombin Time: 26.8 seconds — ABNORMAL HIGH (ref 11.4–15.2)

## 2017-07-28 LAB — CBC
HCT: 34.7 % — ABNORMAL LOW (ref 39.0–52.0)
HEMOGLOBIN: 11.6 g/dL — AB (ref 13.0–17.0)
MCH: 26.4 pg (ref 26.0–34.0)
MCHC: 33.4 g/dL (ref 30.0–36.0)
MCV: 79 fL (ref 78.0–100.0)
Platelets: 229 10*3/uL (ref 150–400)
RBC: 4.39 MIL/uL (ref 4.22–5.81)
RDW: 13.4 % (ref 11.5–15.5)
WBC: 9.8 10*3/uL (ref 4.0–10.5)

## 2017-07-28 LAB — MAGNESIUM: MAGNESIUM: 1.9 mg/dL (ref 1.7–2.4)

## 2017-07-28 MED ORDER — DIGOXIN 125 MCG PO TABS
0.1250 mg | ORAL_TABLET | Freq: Every day | ORAL | Status: DC
  Administered 2017-07-28 – 2017-08-03 (×7): 0.125 mg via ORAL
  Filled 2017-07-28 (×7): qty 1

## 2017-07-28 MED ORDER — POTASSIUM CHLORIDE CRYS ER 20 MEQ PO TBCR
40.0000 meq | EXTENDED_RELEASE_TABLET | Freq: Once | ORAL | Status: AC
Start: 2017-07-28 — End: 2017-07-28
  Administered 2017-07-28: 40 meq via ORAL
  Filled 2017-07-28: qty 2

## 2017-07-28 MED ORDER — POTASSIUM CHLORIDE CRYS ER 20 MEQ PO TBCR
40.0000 meq | EXTENDED_RELEASE_TABLET | Freq: Two times a day (BID) | ORAL | Status: DC
  Administered 2017-07-28 – 2017-08-01 (×8): 40 meq via ORAL
  Filled 2017-07-28 (×8): qty 2

## 2017-07-28 MED ORDER — AMIODARONE LOAD VIA INFUSION
150.0000 mg | Freq: Once | INTRAVENOUS | Status: AC
  Administered 2017-07-28: 150 mg via INTRAVENOUS
  Filled 2017-07-28: qty 83.34

## 2017-07-28 MED ORDER — AMIODARONE HCL IN DEXTROSE 360-4.14 MG/200ML-% IV SOLN
60.0000 mg/h | INTRAVENOUS | Status: AC
  Administered 2017-07-28 (×2): 60 mg/h via INTRAVENOUS
  Filled 2017-07-28 (×2): qty 200

## 2017-07-28 MED ORDER — WARFARIN SODIUM 5 MG PO TABS
5.0000 mg | ORAL_TABLET | Freq: Once | ORAL | Status: AC
  Administered 2017-07-28: 5 mg via ORAL
  Filled 2017-07-28: qty 1

## 2017-07-28 MED ORDER — SPIRONOLACTONE 12.5 MG HALF TABLET
12.5000 mg | ORAL_TABLET | Freq: Every day | ORAL | Status: DC
  Administered 2017-07-28 – 2017-08-01 (×5): 12.5 mg via ORAL
  Filled 2017-07-28 (×5): qty 1

## 2017-07-28 MED ORDER — AMIODARONE HCL IN DEXTROSE 360-4.14 MG/200ML-% IV SOLN
30.0000 mg/h | INTRAVENOUS | Status: DC
  Administered 2017-07-28 – 2017-08-04 (×11): 30 mg/h via INTRAVENOUS
  Filled 2017-07-28 (×14): qty 200

## 2017-07-28 MED ORDER — FUROSEMIDE 10 MG/ML IJ SOLN
80.0000 mg | Freq: Two times a day (BID) | INTRAMUSCULAR | Status: DC
  Administered 2017-07-28 – 2017-07-31 (×7): 80 mg via INTRAVENOUS
  Filled 2017-07-28 (×7): qty 8

## 2017-07-28 NOTE — Progress Notes (Signed)
Pt still awaiting stepdown bed. Night shift RN made aware.

## 2017-07-28 NOTE — Progress Notes (Addendum)
Progress Note  Patient Name: Michael Randolph Date of Encounter: 07/28/2017  Primary Cardiologist: Dr Acie Fredrickson  Subjective   Thinks SOB a little better, willing to wear compression socks  Inpatient Medications    Scheduled Meds: . cholecalciferol  2,000 Units Oral BID  . sodium chloride flush  3 mL Intravenous Q12H  . Warfarin - Pharmacist Dosing Inpatient   Does not apply q1800   Continuous Infusions: . sodium chloride    . milrinone 0.25 mcg/kg/min (07/28/17 0242)   PRN Meds: sodium chloride, acetaminophen, ondansetron (ZOFRAN) IV, sodium chloride, sodium chloride flush, sodium chloride flush   Vital Signs    Vitals:   07/27/17 1715 07/27/17 2011 07/28/17 0233 07/28/17 0431  BP: (!) 88/69 (!) 112/97 110/75 103/68  Pulse: 92 100 90 68  Resp:   20 20  Temp:  98 F (36.7 C) 98.3 F (36.8 C) 97.6 F (36.4 C)  TempSrc:  Oral Oral Oral  SpO2: 98% 96% 97% 95%  Weight:    218 lb (98.9 kg)  Height:        Intake/Output Summary (Last 24 hours) at 07/28/2017 0756 Last data filed at 07/28/2017 4742 Gross per 24 hour  Intake 1336.13 ml  Output 1725 ml  Net -388.87 ml   Filed Weights   07/26/17 0410 07/27/17 0451 07/28/17 0431  Weight: 219 lb 12.8 oz (99.7 kg) 221 lb 8 oz (100.5 kg) 218 lb (98.9 kg)    Telemetry    SR, PVCs are frequently > 30/min, also w/ runs NSVT - Personally Reviewed   Physical Exam   GEN: WD, elderly male, NAD.   Neck: JVD 10-12 cm Cardiac: Irreg R&R, 2/6 SEM Respiratory:  Decreased BS bases, some rales and rhonchi, slight wheeze GI: soft, NT, ND MS:1- 2+ LE edema Neuro:  no focal deficits Psych: nl affect  Labs    Chemistry Recent Labs  Lab 07/25/17 1208 07/26/17 0737 07/27/17 0556 07/28/17 0501  NA 128* 127* 126* 129*  K 5.2* 4.2 3.9 3.4*  CL 95* 94* 94* 94*  CO2 21* 20* 21* 23  GLUCOSE 187* 131* 121* 116*  BUN 30* 33* 36* 32*  CREATININE 1.19 1.19 1.11 1.26*  CALCIUM 8.9 9.0 9.0 8.7*  PROT 6.6  --   --   --   ALBUMIN  3.5  --   --   --   AST 66*  --   --   --   ALT 63  --   --   --   ALKPHOS 58  --   --   --   BILITOT 1.2  --   --   --   GFRNONAA 56* 56* >60 52*  GFRAA >60 >60 >60 >60  ANIONGAP 12 13 11 12      Hematology Recent Labs  Lab 07/26/17 0737 07/27/17 0556 07/28/17 0501  WBC 10.9* 9.3 9.8  RBC 4.64 4.54 4.39  HGB 12.5* 12.2* 11.6*  HCT 37.2* 35.9* 34.7*  MCV 80.2 79.1 79.0  MCH 26.9 26.9 26.4  MCHC 33.6 34.0 33.4  RDW 13.4 13.6 13.4  PLT 256 206 229    Cardiac Enzymes Recent Labs  Lab 07/25/17 1824 07/26/17 0032 07/26/17 0737  TROPONINI 0.10* 0.13* 0.11*    Recent Labs  Lab 07/25/17 1224  TROPIPOC 0.10*     BNP Recent Labs  Lab 07/25/17 1208  BNP 1,864.1*    Lab Results  Component Value Date   INR 2.50 07/28/2017   INR 2.31 07/27/2017  INR 2.38 07/26/2017    Cardiac studies    ECHO: 07/26/2017 - Left ventricle: The cavity size was mildly dilated. Wall   thickness was increased in a pattern of mild LVH. The estimated   ejection fraction was 25%. Diffuse hypokinesis with inferolateral   akinesis. Features are consistent with a pseudonormal left   ventricular filling pattern, with concomitant abnormal relaxation   and increased filling pressure (grade 2 diastolic dysfunction). - Aortic valve: There was no stenosis. There was trivial   regurgitation. - Mitral valve: Mildly to moderately calcified annulus. Mildly   calcified leaflets . There was at least moderate mitral   regurgitation with tethering/restriction of the posterior mitral   leaflet. - Left atrium: The atrium was severely dilated. - Right ventricle: The cavity size was mildly dilated. Systolic   function was moderately reduced. - Tricuspid valve: Peak RV-RA gradient (S): 27 mm Hg. - Pulmonary arteries: PA peak pressure: 35 mm Hg (S). - Systemic veins: IVC measured 2.1 cm with > 50% respirophasic   variation, suggesting RA pressure 8 mmHg. Impressions: - Mildly dilated LV with mild LV  hypertrophy, EF 25%. Diffuse   hypokinesis with inferolateral akinesis. Moderate diastolic   dysfunction. Mildly dilated RV with moderately decreased systolic   function. Tethering of the posterior mitral leaflet with at least   moderate mitral regurgitation. Borderline pulmonary hypertension.   Radiology    Dg Chest 2 View  Result Date: 07/25/2017 CLINICAL DATA:  Shortness of Breath EXAM: CHEST  2 VIEW COMPARISON:  05/27/2016 FINDINGS: Low lung volumes with bibasilar atelectasis. Heart is mildly enlarged. No significant effusions. No acute bony abnormality. IMPRESSION: Borderline cardiomegaly. Low lung volumes with bibasilar atelectasis. Electronically Signed   By: Rolm Baptise M.D.   On: 07/25/2017 13:05    Patient Profile     83 y.o. male with past medical history of nonischemic cardiomyopathy admitted with acute on chronic systolic congestive heart failure.  Assessment & Plan    1. Acute on chronic systolic congestive heart failure-  - NICM  - Dry weight unclear, he is at least 10 lbs above it - s/p PICC for COOX, last pm, was 52.5%, now on milrinone at 0.25 mcg/kg/hr - BP has improved, SBP was 80s at times 01/14 - Cozaar d/c'd, no BP lowering rx at this time - spoke w/ Dr Harrington Challenger, restart Lasix at 80 mg bid (he got this last pm) - add Kdur 40 meq bid - Cr higher than on admit, no sig change in BUN  2. 2  PVCs   - very frequent and has NSVT as well  Had before milrinone started - supp K+ to 4.0, Mg was 1.9 on 01/13 - K+ 3.4 today, give extra 40 meq - MD advise on adding amio to suppress   3.   Moderate MR   - follow by serial echo   4.   Hyperkalemia   - improved, follow  5.   Hyponatremia- - likely from diuresis - nadir 126, now 129 - 1500 cc fluid restriction  6.    Mildly elevated troponin- - felt 2nd CHF - Hx NICM, no CP - no ischemic eval needed   7.    History of DVT- - on coumadin per pharmacy - therapeutic  For questions or updates, please contact  [place] Please consult [email or web address] for contact info under Cardiology/STEMI.      Signed, Rosaria Ferries, PA-C  07/28/2017, 7:56 AM    Pt seen and examined  I agree with  findings as noted above by R Barrett Pt started on milrinone yesterday  Given IV lasix last night with some  response Cr sl higher this AM Coox pending  On exam:  Lungs with rales and wheezes  Cardiac exam:  RRR with frequent skip  II/VI systolic murmur  Abd Benign  Ext with 1+ edema  Tele with frequent PVCs  Will continue IV meds as above   I would recomm starting amiodarone orally and watch PVC burden  This may have contrib to LV dysfunction and it is not helping output.  PVCs were present before milrinone staerted   May contact  advanced CHF service as I think this may be best for long term  Dorris Carnes

## 2017-07-28 NOTE — Consult Note (Addendum)
Advanced Heart Failure Team Consult Note  Primary Cardiologist:  Dr. Acie Fredrickson  Reason for Consultation: Acute on chronic systolic CHF  HPI:    Michael Randolph is seen today for evaluation of A/C systolic CHF at the request of Dr. Harrington Challenger.   [name] is a 83 y.o. male with h/o NICM by cath 11/2014, mild non-obstructive CAD, h/o left leg DVT, HLD, DM2, and Kidney Stone.  Last seen in [facility] clinic 02/2017 thought to be relatively stable, but questions brought up about compliance.   Pt presented to [facility] 07/25/17 with 3 weeks of worsening DOE and pedal edema. Had been started on lasix 20 mg daily and then 40 mg daily without improvement. Pertinent labs on admission include Cr 1.19, K 5.2, BNP 1864, Troponin peaked at 0.13, and Hgb 13.2. Admitted for further evaluation and treatment. Echo as below with low EF  Pt had been diuresing poorly, so PICC line placed 07/27/17. Initial Coox 52% so started on milrinone 0.25 mcg/kg/min with marked improvement in diuresis.   Pt states in Nov/Sep he felt great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 Baseline weight at home 209-212. Denies chest pain. He does state with exertion he feels palpitations, for about the past year. He "used to" snore heavily, but has improved with "garlic pills". Usually doesn't have peripheral edema, but has been developing over the past week. Feels like it is worse today.   Echo 07/16/2017 LVEF 25%, Grade 2 DD, Trivial AI, Mod MR, Sev LAE,   R/LHC 12/01/2014  Mild, nonobstructive coronary artery disease.  Severe left ventricular dysfunction with ejection fraction 20-25%. Nonischemic cardiomyopathy.  Moderate pulmonary hypertension.  Pulmonary artery saturation 67%. Cardiac output 5.78 L/m. Cardiac index 2.8. RHC Procedural Findings: Hemodynamics (mmHg) RA mean 6 RV 47/3 PA 55/22 PCWP 26 AO 90/60 Cardiac Output (Fick) 5.78 Cardiac Index (Fick) 2.81  Review of  Systems: [y] = yes, [ ]  = no   General: Weight gain [y]; Weight loss [ ] ; Anorexia [ ] ; Fatigue [y]; Fever [ ] ; Chills [ ] ; Weakness [ ]   Cardiac: Chest pain/pressure [ ] ; Resting SOB [ ] ; Exertional SOB [y]; Orthopnea [y]; Pedal Edema [y]; Palpitations [ ] ; Syncope [ ] ; Presyncope [ ] ; Paroxysmal nocturnal dyspnea[ ]   Pulmonary: Cough [ ] ; Wheezing[ ] ; Hemoptysis[ ] ; Sputum [ ] ; Snoring [ ]   GI: Vomiting[ ] ; Dysphagia[ ] ; Melena[ ] ; Hematochezia [ ] ; Heartburn[ ] ; Abdominal pain [ ] ; Constipation [ ] ; Diarrhea [ ] ; BRBPR [ ]   GU: Hematuria[ ] ; Dysuria [ ] ; Nocturia[ ]   Vascular: Pain in legs with walking [ ] ; Pain in feet with lying flat [ ] ; Non-healing sores [ ] ; Stroke [ ] ; TIA [ ] ; Slurred speech [ ] ;  Neuro: Headaches[ ] ; Vertigo[ ] ; Seizures[ ] ; Paresthesias[ ] ;Blurred vision [ ] ; Diplopia [ ] ; Vision changes [ ]   Ortho/Skin: Arthritis [y]; Joint pain [y]; Muscle pain [ ] ; Joint swelling [ ] ; Back Pain [ ] ; Rash [ ]   Psych: Depression[ ] ; Anxiety[ ]   Heme: Bleeding problems [ ] ; Clotting disorders [ ] ; Anemia [ ]   Endocrine: Diabetes [ ] ; Thyroid dysfunction[ ]   Home Medications Prior to Admission medications   Medication Sig Start Date End Date Taking? Authorizing Provider  aspirin 81 MG tablet Take 81 mg by mouth at bedtime.    Yes [provider]  Cholecalciferol 2000 UNITS CAPS Take 1 capsule by mouth 2 (two) times daily.  Yes [provider]  furosemide (LASIX) 20 MG tablet Take 1 tablet (20 mg total) by mouth daily as needed for fluid or edema. May use extra prn weight gain 06/13/16  Yes Nahser, Wonda Cheng, MD  GARLIC PO Take 1 tablet by mouth daily.   Yes [provider]  MAGNESIUM PO Take 1 tablet by mouth 2 (two) times daily.    Yes [provider]  Methylsulfonylmethane (MSM) 1000 MG CAPS Take 1,000 mg by mouth daily.    Yes [provider]  sodium chloride (OCEAN) 0.65 % SOLN nasal spray Place 1 spray into both nostrils as needed  for congestion.   Yes [provider]  warfarin (COUMADIN) 5 MG tablet Take 2.5-5 mg by mouth See admin instructions. Takes 2.5mg  Mon, Wed, Fri; Take 5mg  Tues, Thurs, Sat, Sun   Yes [provider]  fenofibrate (TRICOR) 145 MG tablet Take 1 tablet (145 mg total) by mouth daily. Patient not taking: Reported on 07/25/2017 03/11/17   Nahser, Wonda Cheng, MD  losartan (COZAAR) 25 MG tablet Take 1 tablet (25 mg total) by mouth daily. Patient not taking: Reported on 07/25/2017 06/13/16   Nahser, Wonda Cheng, MD  metoprolol succinate (TOPROL-XL) 25 MG 24 hr tablet Take 0.5 tablets (12.5 mg total) by mouth daily. Take with or immediately following a meal. Patient not taking: Reported on 07/25/2017 06/13/16 03/11/17  Nahser, Wonda Cheng, MD   Past Medical History: Past Medical History:  Diagnosis Date  . Congestive heart failure (CHF) ([place])   . Diabetes mellitus without complication (HCC)    no medications  . Diverticulosis   . DVT (deep venous thrombosis) ([place])   . Heart disease   . Hyperlipemia   . Kidney stone   . NICM (nonischemic cardiomyopathy) ([place])   . OSA (obstructive sleep apnea)   . Stroke ([place])   . TIA (transient ischemic attack) 2008   was seen on CT   . Toe infection    Past Surgical History: Past Surgical History:  Procedure Laterality Date  . CARDIAC CATHETERIZATION N/A 12/01/2014   Procedure: Right/Left Heart Cath and Coronary Angiography;  Surgeon: Jettie Booze, MD;  Location: [facility];  Service: Cardiovascular;  Laterality: N/A;  . COLONOSCOPY  2013  . ROTATOR CUFF REPAIR Right    Family History: Family History  Problem Relation Age of Onset  . Stroke Mother   . Hypertension Mother   . Heart attack Father   . Deep vein thrombosis Father   . Crohn's disease Brother   . Schizophrenia Sister    Social History: Social History   Socioeconomic History  . Marital status: Married    Spouse name: None  . Number of children: 3  . Years of  education: Associates  . Highest education level: None  Social Needs  . Financial resource strain: None  . Food insecurity - worry: None  . Food insecurity - inability: None  . Transportation needs - medical: None  . Transportation needs - non-medical: None  Occupational History  . Occupation: Retired   Tobacco Use  . Smoking status: Never Smoker  . Smokeless tobacco: Never Used  Substance and Sexual Activity  . Alcohol use: No    Alcohol/week: 0.0 oz  . Drug use: No  . Sexual activity: None  Other Topics Concern  . None  Social History Narrative   Drinks 1-2 cups of coffee a day    Allergies:  Allergies  Allergen Reactions  . Actos [Pioglitazone] Other (  See Comments)    Slow HR and pain in face   . Anesthetics, Halogenated     Slept too long  . Crestor [Rosuvastatin Calcium]     Pain   . Sulfa Antibiotics Other (See Comments)    hallucinations  . Dye Fdc Red [Red Dye] Rash    # 40   Objective:    Vital Signs:   Temp:  [97.6 F (36.4 C)-98.3 F (36.8 C)] 97.6 F (36.4 C) (01/15 0431) Pulse Rate:  [45-100] 68 (01/15 0431) Resp:  [20] 20 (01/15 0431) BP: (88-120)/(60-97) 103/68 (01/15 0431) SpO2:  [95 %-98 %] 95 % (01/15 0431) Weight:  [218 lb (98.9 kg)] 218 lb (98.9 kg) (01/15 0431) Last BM Date: 07/27/17  Weight change: Filed Weights   07/26/17 0410 07/27/17 0451 07/28/17 0431  Weight: 219 lb 12.8 oz (99.7 kg) 221 lb 8 oz (100.5 kg) 218 lb (98.9 kg)   Intake/Output:   Intake/Output Summary (Last 24 hours) at 07/28/2017 1149 Last data filed at 07/28/2017 1056 Gross per 24 hour  Intake 986.13 ml  Output 1725 ml  Net -738.87 ml    Physical Exam    General: Elderly appearing. NAD.  HEENT: Normal Neck: Supple. JVP elevated to jaw. Carotids 2+ bilat; no bruits. No lymphadenopathy or thyromegaly appreciated. Cor: PMI lateral. Regular rate & rhythm. +S3 Lungs: Diminished basilar sounds. Abdomen: Soft, nontender, nondistended. No hepatosplenomegaly. No  bruits or masses. Good bowel sounds. Extremities: No cyanosis, clubbing, or rash. 3+ BLE edema. Neuro: Alert & orientedx3, cranial nerves grossly intact. moves all 4 extremities w/o difficulty. Affect pleasant   Telemetry   Sinus tach 110 with very frequent PVCs, upwards of 40 a minute at times. Heavy PVC burden since at least 07/26/17.  EKG    Sinus tach 104 with PVC on admit, Personally reviewed.   Labs   Basic Metabolic Panel: Recent Labs  Lab 07/25/17 1208 07/26/17 0032 07/26/17 0737 07/27/17 0556 07/28/17 0501  NA 128*  --  127* 126* 129*  K 5.2*  --  4.2 3.9 3.4*  CL 95*  --  94* 94* 94*  CO2 21*  --  20* 21* 23  GLUCOSE 187*  --  131* 121* 116*  BUN 30*  --  33* 36* 32*  CREATININE 1.19  --  1.19 1.11 1.26*  CALCIUM 8.9  --  9.0 9.0 8.7*  MG  --  1.9  --   --   --     Liver Function Tests: Recent Labs  Lab 07/25/17 1208  AST 66*  ALT 63  ALKPHOS 58  BILITOT 1.2  PROT 6.6  ALBUMIN 3.5   Recent Labs  Lab 07/25/17 1208  LIPASE 28   No results for input(s): AMMONIA in the last 168 hours.  CBC: Recent Labs  Lab 07/25/17 1208 07/26/17 0737 07/27/17 0556 07/28/17 0501  WBC 12.0* 10.9* 9.3 9.8  HGB 13.2 12.5* 12.2* 11.6*  HCT 38.5* 37.2* 35.9* 34.7*  MCV 80.5 80.2 79.1 79.0  PLT 258 256 206 229    Cardiac Enzymes: Recent Labs  Lab 07/25/17 1824 07/26/17 0032 07/26/17 0737  TROPONINI 0.10* 0.13* 0.11*    BNP: BNP (last 3 results) Recent Labs    07/25/17 1208  BNP 1,864.1*    ProBNP (last 3 results) No results for input(s): PROBNP in the last 8760 hours.   CBG: No results for input(s): GLUCAP in the last 168 hours.  Coagulation Studies: Recent Labs    07/25/17 1208 07/26/17 0737  07/27/17 0556 07/28/17 0501  LABPROT 29.0* 25.8* 25.2* 26.8*  INR 2.76 2.38 2.31 2.50     Imaging    No results found.   Medications:     Current Medications: . cholecalciferol  2,000 Units Oral BID  . furosemide  80 mg Intravenous  BID  . potassium chloride  40 mEq Oral BID  . sodium chloride flush  3 mL Intravenous Q12H  . warfarin  5 mg Oral ONCE-1800  . Warfarin - Pharmacist Dosing Inpatient   Does not apply q1800     Infusions: . sodium chloride    . milrinone 0.25 mcg/kg/min (07/28/17 0242)       Patient Profile   ZAXTON ANGERER is a 83 y.o. male with past medical history of nonischemic cardiomyopathy admitted with acute on chronic systolic congestive heart failure.  Assessment/Plan   1. Acute on chronic systolic congestive heart failure -> NICM by cath 2016. - Echo 07/16/2017 LVEF 25%, Grade 2 DD, Trivial AI, Mod MR, Sev LAE - Unclear etiology. Did have severe Flu episode 2015. ? PVC burden. Consider cMRI. - Volume status markedly elevated on exam with ^JVP and 3+ edema - Continue lasix 80 mg IV BID. - Place unna boots. - PICC placed. Initial coox 52.5% -> started on mirlinone 0.25 mcg/kg/min. Move to stepdown for Coox and CVP.  - Coox 57.4% this am on milrinone.  - Off cozaar with soft BPs.  - Start spiro 12.5 mg daily - Start digoxin 0.125 mg daily. Follow level.    2. PVCs   - Unclear chronicity, PTA vs after mirlinone.  - Agree with amiodarone. - Goal K > 4.0 and Mg > 2.0   3. Moderate MR   - By echo this admit.    4. Hypokalemia - Supp with diuresis.   5. Hyponatremia- - 129 this am.  - Continue to follow closely.  - Fluid restrict.    6. History of DVT- - On long term coumadin.  - Dosing per pharm.   7. Left carotid bruit - Carotid dopplers 03/2017 1-39% bilateral ICA stenosis.  Medication concerns reviewed with patient and pharmacy team. Barriers identified: None at this time.  Length of Stay: 3  Annamaria Helling  07/28/2017, 11:49 AM  Advanced Heart Failure Team Pager [phone] (M-F; 7a - 4p)  Please contact [facility] for night-coverage after hours (4p -7a ) and weekends on [email or web address]  Patient seen and examined with the above-signed Advanced  Practice Provider and/or Housestaff. I personally reviewed laboratory data, imaging studies and relevant notes. I independently examined the patient and formulated the important aspects of the plan. I have edited the note to reflect any of my changes or salient points. I have personally discussed the plan with the patient and/or family.  83 y/o with chronic systolic HF and moderate MR due to NICM. (exact etiology unclear). Cath 2016 normal cors. Was doing well until September when he stopped his HF meds. Since Christmas has had increasing volume overload and HF symptoms. Admitted for diuresis. Echo (Personally reviewed shows EF 25-30% with moderate RV dysfunction and moderate MR. Responded poorly to IV lasix. Now on milrinone with co-ox 57%. Remains at least 20 pounds volume overloaded.  On exam  JVP to jaw Cor + s3 irregular Lungs + crackles Ext 2+ edema  Will continue milrinone for now. In reviewing tele has multiple PVCs (which preceded milrinone) and may have PVC CM though echo looks more ischemic. Will wrap legs, diurese with IV lasix  and obtain cMRI. Restart HF meds slowly. Continue amio to suppress PVCs.   Long discussion with patient and his wife and natural course of HF.   Glori Bickers, MD  4:21 PM

## 2017-07-28 NOTE — Progress Notes (Signed)
Orthopedic Tech Progress Note Patient Details:  Michael Randolph 25-Jun-1935 [id number]  Ortho Devices Type of Ortho Device: Louretta Parma boot Ortho Device/Splint Location: bilateral Ortho Device/Splint Interventions: Application   Post Interventions Patient Tolerated: Well Instructions Provided: Care of device   [name] 07/28/2017, 1:40 PM

## 2017-07-28 NOTE — Progress Notes (Signed)
[facility] for warfarin Indication: DVT  Allergies  Allergen Reactions  . Actos [Pioglitazone] Other (See Comments)    Slow HR and pain in face   . Anesthetics, Halogenated     Slept too long  . Crestor [Rosuvastatin Calcium]     Pain   . Sulfa Antibiotics Other (See Comments)    hallucinations  . Dye Fdc Red [Red Dye] Rash    # 40    Patient Measurements: Height: 5' 6.5" (168.9 cm) Weight: 218 lb (98.9 kg) IBW/kg (Calculated) : 64.95  Vital Signs: Temp: 97.6 F (36.4 C) (01/15 0431) Temp Source: Oral (01/15 0431) BP: 103/68 (01/15 0431) Pulse Rate: 68 (01/15 0431)  Labs: Recent Labs    07/25/17 1824 07/26/17 0032 07/26/17 0737 07/27/17 0556 07/28/17 0501  HGB  --   --  12.5* 12.2* 11.6*  HCT  --   --  37.2* 35.9* 34.7*  PLT  --   --  256 206 229  LABPROT  --   --  25.8* 25.2* 26.8*  INR  --   --  2.38 2.31 2.50  CREATININE  --   --  1.19 1.11 1.26*  TROPONINI 0.10* 0.13* 0.11*  --   --     Estimated Creatinine Clearance: 52 mL/min (A) (by C-G formula based on SCr of 1.26 mg/dL (H)).   Assessment: 32 yom being evaluated for acute on chronic CHF, on warfarin PTA for chronic DVTs. Home regimen is 5 mg daily except 2.5 mg on MWF. Following with Dr Oretha Ellis at [facility]. Last dose was 2.5 mg on 1/11 PTA.  INR remains therapeutic at 2.5. CBC stable. No signs/symptoms of bleeding.   Goal of Therapy:  INR 2-3 Monitor platelets by anticoagulation protocol: Yes   Plan:  Warfarin 5 mg PO tonight (home dose) Monitor daily INR, CBC, s/sx bleeding   Renold Genta, PharmD, BCPS Clinical Pharmacist Phone for today - [facility] - [phone] 07/28/2017 9:54 AM

## 2017-07-29 DIAGNOSIS — I493 Ventricular premature depolarization: Secondary | ICD-10-CM

## 2017-07-29 DIAGNOSIS — N179 Acute kidney failure, unspecified: Secondary | ICD-10-CM

## 2017-07-29 LAB — CBC
HCT: 34.9 % — ABNORMAL LOW (ref 39.0–52.0)
Hemoglobin: 11.8 g/dL — ABNORMAL LOW (ref 13.0–17.0)
MCH: 26.8 pg (ref 26.0–34.0)
MCHC: 33.8 g/dL (ref 30.0–36.0)
MCV: 79.3 fL (ref 78.0–100.0)
PLATELETS: 214 10*3/uL (ref 150–400)
RBC: 4.4 MIL/uL (ref 4.22–5.81)
RDW: 13.6 % (ref 11.5–15.5)
WBC: 9.1 10*3/uL (ref 4.0–10.5)

## 2017-07-29 LAB — BASIC METABOLIC PANEL
Anion gap: 11 (ref 5–15)
BUN: 34 mg/dL — ABNORMAL HIGH (ref 6–20)
CALCIUM: 8.7 mg/dL — AB (ref 8.9–10.3)
CO2: 24 mmol/L (ref 22–32)
CREATININE: 1.5 mg/dL — AB (ref 0.61–1.24)
Chloride: 95 mmol/L — ABNORMAL LOW (ref 101–111)
GFR calc non Af Amer: 42 mL/min — ABNORMAL LOW (ref >60)
GFR, EST AFRICAN AMERICAN: 49 mL/min — AB (ref >60)
Glucose, Bld: 136 mg/dL — ABNORMAL HIGH (ref 65–99)
Potassium: 4 mmol/L (ref 3.5–5.1)
SODIUM: 130 mmol/L — AB (ref 135–145)

## 2017-07-29 LAB — COOXEMETRY PANEL
Carboxyhemoglobin: 1.2 % (ref 0.5–1.5)
Methemoglobin: 1 % (ref 0.0–1.5)
O2 SAT: 67.6 %
TOTAL HEMOGLOBIN: 11.5 g/dL — AB (ref 12.0–16.0)

## 2017-07-29 LAB — MRSA PCR SCREENING: MRSA by PCR: NEGATIVE

## 2017-07-29 LAB — PROTIME-INR
INR: 2.43
PROTHROMBIN TIME: 26.2 s — AB (ref 11.4–15.2)

## 2017-07-29 MED ORDER — METOLAZONE 2.5 MG PO TABS
2.5000 mg | ORAL_TABLET | Freq: Once | ORAL | Status: AC
  Administered 2017-07-29: 2.5 mg via ORAL
  Filled 2017-07-29: qty 1

## 2017-07-29 MED ORDER — POLYETHYLENE GLYCOL 3350 17 G PO PACK: 17.0000 g | PACK | Freq: Every day | ORAL | Status: DC

## 2017-07-29 MED ORDER — POLYETHYLENE GLYCOL 3350 17 G PO PACK
17.0000 g | PACK | Freq: Every day | ORAL | Status: DC | PRN
  Administered 2017-07-29 – 2017-07-31 (×2): 17 g via ORAL
  Filled 2017-07-29 (×2): qty 1

## 2017-07-29 MED ORDER — MAGNESIUM SULFATE 2 GM/50ML IV SOLN
2.0000 g | Freq: Once | INTRAVENOUS | Status: AC
  Administered 2017-07-29: 2 g via INTRAVENOUS
  Filled 2017-07-29: qty 50

## 2017-07-29 MED ORDER — WARFARIN SODIUM 2.5 MG PO TABS
2.5000 mg | ORAL_TABLET | ORAL | Status: DC
  Administered 2017-07-29: 2.5 mg via ORAL
  Filled 2017-07-29: qty 1

## 2017-07-29 MED ORDER — WARFARIN SODIUM 5 MG PO TABS: 5.0000 mg | ORAL_TABLET | ORAL | Status: DC

## 2017-07-29 NOTE — Progress Notes (Signed)
[facility] for warfarin Indication: DVT  Allergies  Allergen Reactions  . Actos [Pioglitazone] Other (See Comments)    Slow HR and pain in face   . Anesthetics, Halogenated     Slept too long  . Crestor [Rosuvastatin Calcium]     Pain   . Sulfa Antibiotics Other (See Comments)    hallucinations  . Dye Fdc Red [Red Dye] Rash    # 40    Patient Measurements: Height: 5\' 7"  (170.2 cm) Weight: 221 lb 12.5 oz (100.6 kg) IBW/kg (Calculated) : 66.1  Vital Signs: Temp: 98.3 F (36.8 C) (01/16 1136) Temp Source: Oral (01/16 1136) BP: 120/63 (01/16 1136) Pulse Rate: 54 (01/16 1136)  Labs: Recent Labs    07/27/17 0556 07/28/17 0501 07/29/17 0253  HGB 12.2* 11.6* 11.8*  HCT 35.9* 34.7* 34.9*  PLT 206 229 214  LABPROT 25.2* 26.8* 26.2*  INR 2.31 2.50 2.43  CREATININE 1.11 1.26* 1.50*    Estimated Creatinine Clearance: 44.4 mL/min (A) (by C-G formula based on SCr of 1.5 mg/dL (H)).   Assessment: 42 yom being evaluated for acute on chronic CHF, on warfarin PTA for chronic DVTs. Home regimen is 5 mg daily except 2.5 mg on MWF. Following with Dr Oretha Ellis at [facility]. Last dose was 2.5 mg on 1/11 PTA.  INR remains therapeutic at 2.4. CBC stable. No signs/symptoms of bleeding.  New amiodarone this admit  - may need to back down doses eventually.    Goal of Therapy:  INR 2-3 Monitor platelets by anticoagulation protocol: Yes   Plan:  Warfarin  (home dose) 5mg  TTSS, 2.5mg  MWF Monitor daily INR, CBC, s/sx bleeding  Bonnita Nasuti Pharm.D. CPP, BCPS Clinical Pharmacist [phone] 07/29/2017 1:06 PM

## 2017-07-29 NOTE — Progress Notes (Signed)
Advanced Heart Failure Rounding Note  PCP:  Primary Cardiologist: Dr. Acie Fredrickson  Subjective:    Feeling OK this am. Remains orthopneic. Able to ambulate the room a bit but not more. Denies lightheadedness walking halls. Remains swollen in legs. Wife and son in room.   Coox 67.6% this am on milrinone 0.25 mcg/kg/min.   Negative 720 cc and down 1 lb.   Objective:   Weight Range: 221 lb 12.5 oz (100.6 kg) Body mass index is 34.74 kg/m.   Vital Signs:   Temp:  [97.5 F (36.4 C)-98 F (36.7 C)] 97.6 F (36.4 C) (01/16 0404) Pulse Rate:  [42-109] 69 (01/16 0800) Resp:  [18-23] 23 (01/16 0800) BP: (96-126)/(55-78) 96/63 (01/16 0404) SpO2:  [89 %-100 %] 97 % (01/16 0800) Weight:  [221 lb 12.5 oz (100.6 kg)-222 lb 0.1 oz (100.7 kg)] 221 lb 12.5 oz (100.6 kg) (01/16 0457) Last BM Date: 07/27/17  Weight change: Filed Weights   07/28/17 0431 07/28/17 2337 07/29/17 0457  Weight: 218 lb (98.9 kg) 222 lb 0.1 oz (100.7 kg) 221 lb 12.5 oz (100.6 kg)   Intake/Output:   Intake/Output Summary (Last 24 hours) at 07/29/2017 0900 Last data filed at 07/29/2017 0800 Gross per 24 hour  Intake 2027.02 ml  Output 2750 ml  Net -722.98 ml    Physical Exam    General:  Weak appearing. No resp difficulty HEENT: Normal Neck: Supple. JVP to jaw. Carotids 2+ bilat; no bruits. No lymphadenopathy or thyromegaly appreciated. Cor: PMI laterally displaced. Frequent ectopy. +S3.  Lungs: Slightly diminished basilar sounds.  Abdomen: Soft, nontender, nondistended. No hepatosplenomegaly. No bruits or masses. Good bowel sounds. Extremities: No cyanosis, clubbing, or rash. 2+ edema. Unna boots in place.  Neuro: Alert & orientedx3, cranial nerves grossly intact. moves all 4 extremities w/o difficulty. Affect pleasant  Telemetry   NSR 90-100s with frequent ectopy, personally reviewed. Continues to have upwards of 30-40 PVCs a minute.   EKG    No new tracing.   Labs    CBC Recent Labs     07/28/17 0501 07/29/17 0253  WBC 9.8 9.1  HGB 11.6* 11.8*  HCT 34.7* 34.9*  MCV 79.0 79.3  PLT 229 536   Basic Metabolic Panel Recent Labs    07/28/17 0501 07/28/17 1218 07/29/17 0253  NA 129*  --  130*  K 3.4*  --  4.0  CL 94*  --  95*  CO2 23  --  24  GLUCOSE 116*  --  136*  BUN 32*  --  34*  CREATININE 1.26*  --  1.50*  CALCIUM 8.7*  --  8.7*  MG  --  1.9  --    Liver Function Tests No results for input(s): AST, ALT, ALKPHOS, BILITOT, PROT, ALBUMIN in the last 72 hours. No results for input(s): LIPASE, AMYLASE in the last 72 hours. Cardiac Enzymes No results for input(s): CKTOTAL, CKMB, CKMBINDEX, TROPONINI in the last 72 hours.  BNP: BNP (last 3 results) Recent Labs    07/25/17 1208  BNP 1,864.1*    ProBNP (last 3 results) No results for input(s): PROBNP in the last 8760 hours.   D-Dimer No results for input(s): DDIMER in the last 72 hours. Hemoglobin A1C No results for input(s): HGBA1C in the last 72 hours. Fasting Lipid Panel No results for input(s): CHOL, HDL, LDLCALC, TRIG, CHOLHDL, LDLDIRECT in the last 72 hours. Thyroid Function Tests No results for input(s): TSH, T4TOTAL, T3FREE, THYROIDAB in the last 72 hours.  Invalid  input(s): FREET3  Other results:   Imaging     No results found.   Medications:     Scheduled Medications: . cholecalciferol  2,000 Units Oral BID  . digoxin  0.125 mg Oral Daily  . furosemide  80 mg Intravenous BID  . potassium chloride  40 mEq Oral BID  . sodium chloride flush  3 mL Intravenous Q12H  . spironolactone  12.5 mg Oral Daily  . Warfarin - Pharmacist Dosing Inpatient   Does not apply q1800     Infusions: . sodium chloride Stopped (07/29/17 0400)  . amiodarone 30 mg/hr (07/29/17 0800)  . milrinone 0.25 mcg/kg/min (07/29/17 0800)     PRN Medications:  sodium chloride, acetaminophen, ondansetron (ZOFRAN) IV, sodium chloride, sodium chloride flush, sodium chloride flush    Patient Profile    Michael Randolph is a 83 y.o. male with past medical history of nonischemic cardiomyopathy admitted with acute on chronic systolic congestive heart failure.  HF team consulted 07/28/17 with low output CHF  Assessment/Plan   1. Acute on chronic systolic congestive heart failure -> NICM by cath 2016. - Echo 07/16/2017 LVEF 25%, Grade 2 DD, Trivial AI, Mod MR, Sev LAE - Unclear etiology. Did have severe Flu episode 2015. ? PVC burden. Consider cMRI. - Coox 67.6% this am on milrinone 0.25 mcg/kg/min.  - Volume status remains markedly elevated.  CVP 12-13 - Continue IV lasix 80 mg BID - Give metolazone 2.5 mg x 1. Will give extra K along with.  - Continue unna boots.  -No room for losartan today with soft BPs. Would eventually like to get on Entresto. - Continue spiro 12.5 mg daily - Continue digoxin 0.125 mg daily.   2.PVCs  -Unclear chronicity, PTA vs after mirlinone.  - Continue IV amiodarone while on milrinone.  - Goal K > 4.0 and Mg 2.0. Will supp Mg today.   3. AKI - Creatinine 1.1 -> 1.26 -> 1.50. - Continue to follow closely with diuresis.   4.Moderate MR  - By echo this admit. No change.   5.Hypokalemia - 4.0 this am. Continue to follow.   6.Hyponatremia- - 130 this am.   - Continue to follow closely.  - Continue fluid restrict.   7.History of DVT- - On long term coumadin.  - INR 2.43 this am. Dosing per pharm.   8. Left carotid bruit - Carotid dopplers 03/2017 1-39% bilateral ICA stenosis. No change.   Medication concerns reviewed with patient and pharmacy team. Barriers identified: None at this time.   Length of Stay: [place], [place]  07/29/2017, 9:00 AM  Advanced Heart Failure Team Pager [phone] (M-F; 7a - 4p)  Please contact [facility] for night-coverage after hours (4p -7a ) and weekends on [email or web address]    Patient seen and examined with the above-signed Advanced Practice Provider and/or Housestaff. I personally  reviewed laboratory data, imaging studies and relevant notes. I independently examined the patient and formulated the important aspects of the plan. I have edited the note to reflect any of my changes or salient points. I have personally discussed the plan with the patient and/or family.  He remains very ill. Co-ox improved on milrinone but still markedly volume overloaded with prominent s3. Renal function a bit worse despite inotropic support. Still with frequent PVCs despite IV amio. Sodium dropping.   I am very concerned about him. Will continue IV inotropes and IV diuretics for now. Continue amio for PVC suppression. Options very limited. Although he looks  younger than stated age, his age likely precludes VAD consideration particularly with need for re-do sternotomy.   Glori Bickers, MD  9:43 PM

## 2017-07-29 NOTE — Progress Notes (Signed)
Patient has 12 beat run of VTach, upon assessment, patient asymptomatic.  Cardiology notified. No new orders at this time.  RN will continue to monitor patient and if this reoccurs, will draw AM labs early to assess electrolytes as discussed with MD.

## 2017-07-30 LAB — BASIC METABOLIC PANEL
ANION GAP: 14 (ref 5–15)
BUN: 29 mg/dL — ABNORMAL HIGH (ref 6–20)
CHLORIDE: 91 mmol/L — AB (ref 101–111)
CO2: 24 mmol/L (ref 22–32)
CREATININE: 1.6 mg/dL — AB (ref 0.61–1.24)
Calcium: 8.7 mg/dL — ABNORMAL LOW (ref 8.9–10.3)
GFR calc non Af Amer: 39 mL/min — ABNORMAL LOW (ref >60)
GFR, EST AFRICAN AMERICAN: 45 mL/min — AB (ref >60)
Glucose, Bld: 250 mg/dL — ABNORMAL HIGH (ref 65–99)
POTASSIUM: 3.3 mmol/L — AB (ref 3.5–5.1)
SODIUM: 129 mmol/L — AB (ref 135–145)

## 2017-07-30 LAB — COOXEMETRY PANEL
CARBOXYHEMOGLOBIN: 1.2 % (ref 0.5–1.5)
Methemoglobin: 1.1 % (ref 0.0–1.5)
O2 SAT: 60 %
TOTAL HEMOGLOBIN: 11.7 g/dL — AB (ref 12.0–16.0)

## 2017-07-30 LAB — PROTIME-INR
INR: 3.26
PROTHROMBIN TIME: 33 s — AB (ref 11.4–15.2)

## 2017-07-30 LAB — CBC
HCT: 35.6 % — ABNORMAL LOW (ref 39.0–52.0)
HEMOGLOBIN: 11.8 g/dL — AB (ref 13.0–17.0)
MCH: 26.4 pg (ref 26.0–34.0)
MCHC: 33.1 g/dL (ref 30.0–36.0)
MCV: 79.6 fL (ref 78.0–100.0)
Platelets: 226 10*3/uL (ref 150–400)
RBC: 4.47 MIL/uL (ref 4.22–5.81)
RDW: 13.6 % (ref 11.5–15.5)
WBC: 10.3 10*3/uL (ref 4.0–10.5)

## 2017-07-30 LAB — MAGNESIUM: MAGNESIUM: 2.1 mg/dL (ref 1.7–2.4)

## 2017-07-30 MED ORDER — METOLAZONE 2.5 MG PO TABS
2.5000 mg | ORAL_TABLET | Freq: Once | ORAL | Status: AC
  Administered 2017-07-30: 2.5 mg via ORAL
  Filled 2017-07-30: qty 1

## 2017-07-30 MED ORDER — POTASSIUM CHLORIDE CRYS ER 20 MEQ PO TBCR
40.0000 meq | EXTENDED_RELEASE_TABLET | Freq: Once | ORAL | Status: AC
  Administered 2017-07-30: 40 meq via ORAL
  Filled 2017-07-30: qty 2

## 2017-07-30 NOTE — Progress Notes (Signed)
Advanced Heart Failure Rounding Note  Primary Cardiologist: Dr. Acie Fredrickson  Subjective:    Feeling better today. No SOB getting around room. DOE improving. Asking about sodium intake. Had been aiming for ~ 1500 mg daily at home.   Coox 60% on milrinone 0.25 mcg/kg/min.   Diuresing fairly. BP stable. Negative 1.9L and down 4 lbs. CVP ~11  Objective:   Weight Range: 217 lb 6 oz (98.6 kg) Body mass index is 34.05 kg/m.   Vital Signs:   Temp:  [97.3 F (36.3 C)-98.7 F (37.1 C)] 98.4 F (36.9 C) (01/17 0840) Pulse Rate:  [54-98] 71 (01/17 0840) Resp:  [19-29] 19 (01/17 0840) BP: (94-120)/(60-92) 94/60 (01/17 0840) SpO2:  [93 %-98 %] 93 % (01/17 0840) Weight:  [217 lb 6 oz (98.6 kg)] 217 lb 6 oz (98.6 kg) (01/17 0340) Last BM Date: 07/27/17  Weight change: Filed Weights   07/28/17 2337 07/29/17 0457 07/30/17 0340  Weight: 222 lb 0.1 oz (100.7 kg) 221 lb 12.5 oz (100.6 kg) 217 lb 6 oz (98.6 kg)   Intake/Output:   Intake/Output Summary (Last 24 hours) at 07/30/2017 0843 Last data filed at 07/30/2017 0730 Gross per 24 hour  Intake 1544 ml  Output 3675 ml  Net -2131 ml    Physical Exam    General: Elderly appearing. Weak. HEENT: Normal. Anicteric  Neck: Supple. JVP at least 10 cm. Carotids 2+ bilat; no bruits. No thyromegaly or nodule noted. Cor: PMI lateral. Frequent ectopy. +S3 Lungs: Diminished basilar sounds. Bibasilar crackles L>R Abdomen: Soft, non-tender, non-distended, no HSM. No bruits or masses. +BS  Extremities: No cyanosis, clubbing, or rash. 1-2+ edema. Unna boots in place.  Neuro: Alert & orientedx3, cranial nerves grossly intact. moves all 4 extremities w/o difficulty. Affect pleasant   Telemetry   NSR 90-100s, with frequent ectopy. 20-30 PVCs a minute, personally reviewed.   EKG    No new tracings.    Labs    CBC Recent Labs    07/29/17 0253 07/30/17 0340  WBC 9.1 10.3  HGB 11.8* 11.8*  HCT 34.9* 35.6*  MCV 79.3 79.6  PLT 214 440    Basic Metabolic Panel Recent Labs    07/28/17 1218 07/29/17 0253 07/30/17 0340  NA  --  130* 129*  K  --  4.0 3.3*  CL  --  95* 91*  CO2  --  24 24  GLUCOSE  --  136* 250*  BUN  --  34* 29*  CREATININE  --  1.50* 1.60*  CALCIUM  --  8.7* 8.7*  MG 1.9  --  2.1   Liver Function Tests No results for input(s): AST, ALT, ALKPHOS, BILITOT, PROT, ALBUMIN in the last 72 hours. No results for input(s): LIPASE, AMYLASE in the last 72 hours. Cardiac Enzymes No results for input(s): CKTOTAL, CKMB, CKMBINDEX, TROPONINI in the last 72 hours.  BNP: BNP (last 3 results) Recent Labs    07/25/17 1208  BNP 1,864.1*    ProBNP (last 3 results) No results for input(s): PROBNP in the last 8760 hours.   D-Dimer No results for input(s): DDIMER in the last 72 hours. Hemoglobin A1C No results for input(s): HGBA1C in the last 72 hours. Fasting Lipid Panel No results for input(s): CHOL, HDL, LDLCALC, TRIG, CHOLHDL, LDLDIRECT in the last 72 hours. Thyroid Function Tests No results for input(s): TSH, T4TOTAL, T3FREE, THYROIDAB in the last 72 hours.  Invalid input(s): FREET3  Other results:   Imaging    No results found.  Medications:     Scheduled Medications: . cholecalciferol  2,000 Units Oral BID  . digoxin  0.125 mg Oral Daily  . furosemide  80 mg Intravenous BID  . potassium chloride  40 mEq Oral BID  . sodium chloride flush  3 mL Intravenous Q12H  . spironolactone  12.5 mg Oral Daily  . warfarin  2.5 mg Oral Once per day on Mon Wed Fri  . warfarin  5 mg Oral Once per day on Sun Tue Thu Sat  . Warfarin - Pharmacist Dosing Inpatient   Does not apply q1800    Infusions: . sodium chloride Stopped (07/29/17 0400)  . amiodarone 30 mg/hr (07/30/17 0400)  . milrinone 0.25 mcg/kg/min (07/30/17 0400)    PRN Medications: sodium chloride, acetaminophen, ondansetron (ZOFRAN) IV, polyethylene glycol, sodium chloride, sodium chloride flush, sodium chloride  flush    Patient Profile   Michael Randolph is a 83 y.o. male with past medical history of nonischemic cardiomyopathy admitted with acute on chronic systolic congestive heart failure.  HF team consulted 07/28/17 with low output CHF  Assessment/Plan   1. Acute on chronic systolic congestive heart failure -> NICM by cath 2016. - Echo 07/16/2017 LVEF 25%, Grade 2 DD, Trivial AI, Mod MR, Sev LAE - Unclear etiology. Did have severe Flu episode 2015. ? PVC burden. Consider cMRI. - Coox 60.0% this am on milrinone 0.25 mcg/kg/min.  - Volume status remains elevated but improving. CVP 11 this am - Continue IV lasix 80 mg BID - Repeat metolazone 2.5 mg this am. Will give extra K supp. - Continue unna boots. - No room for losartan today with soft BPs. Would eventually like to get on Entresto. - Continue spiro 12.5 mg daily - Continue digoxin 0.125 mg daily.  2.PVCs  -Unclear chronicity, PTA vs after mirlinone. - Continue IV amiodarone while on milrinone. - Goal K >  4.0 and Mg 2.0. Will supp Mg today.  3. AKI - Creatinine 1.1 -> 1.26 -> 1.50 -> 1.6. - Continue to follow closely with diuresis.  4.Moderate MR  - By echo this admit. No change.  5.Hypokalemia - 3.3 this am. Will supp.  6.Hyponatremia- - 129 this am. - Continue to follow closely. - Continue fluid restrict.  7.History of DVT- - On long term coumadin. - INR 3.26 this am. Dosing per pharm.  8. Left carotid bruit - Carotid dopplers 03/2017 1-39% bilateral ICA stenosis. No change.  Medication concerns reviewed with patient and pharmacy team. Barriers identified: None at this time.  Length of Stay: 5  Annamaria Helling  07/30/2017, 8:43 AM  Advanced Heart Failure Team Pager [phone] (M-F; 7a - 4p)  Please contact [facility] for night-coverage after hours (4p -7a ) and weekends on [email or web address]  Patient seen and examined with the above-signed Advanced Practice Provider and/or Housestaff. I  personally reviewed laboratory data, imaging studies and relevant notes. I independently examined the patient and formulated the important aspects of the plan. I have edited the note to reflect any of my changes or salient points. I have personally discussed the plan with the patient and/or family.  He remains very tenuous. Co-ox marginal despite milrinone. Starting to diurese more. Creatinine climbing slightly. Sodium dropping. Will continue IV lasix and milrinone.  Still with frequent PVCs despite amio.   Will continue diuresis and milrinone. INR remains high at 3.2. Discussed with PharmD.   Glori Bickers, MD  3:34 PM

## 2017-07-30 NOTE — Care Management Note (Signed)
Case Management Note  Patient Details  Name: Michael Randolph MRN: [id number] Date of Birth: August 18, 1934  Subjective/Objective:       Pt admitted with SOB and HF - pt is now on milrinone, amiodarone and IV lasix             Action/Plan:    PTA independent from home with wife.  Pt weighs daily and adheres to low salt diet.  Pt has PCP and denied barriers to obtain/pay for medications   Expected Discharge Date:                  Expected Discharge Plan:  Home/Self Care  In-House Referral:     Discharge planning Services  CM Consult  Post Acute Care Choice:    Choice offered to:     DME Arranged:    DME Agency:     HH Arranged:    HH Agency:     Status of Service:     If discussed at [employer], dates discussed:    Additional Comments:  Maryclare Labrador, RN 07/30/2017, 2:59 PM

## 2017-07-30 NOTE — Progress Notes (Signed)
[place] for warfarin Indication: DVT  Allergies  Allergen Reactions  . Actos [Pioglitazone] Other (See Comments)    Slow HR and pain in face   . Anesthetics, Halogenated     Slept too long  . Crestor [Rosuvastatin Calcium]     Pain   . Sulfa Antibiotics Other (See Comments)    hallucinations  . Dye Fdc Red [Red Dye] Rash    # 40    Patient Measurements: Height: 5\' 7"  (170.2 cm) Weight: 217 lb 6 oz (98.6 kg) IBW/kg (Calculated) : 66.1  Vital Signs: Temp: 98.4 F (36.9 C) (01/17 0840) Temp Source: Oral (01/17 0840) BP: 94/60 (01/17 0840) Pulse Rate: 71 (01/17 0840)  Labs: Recent Labs    07/28/17 0501 07/29/17 0253 07/30/17 0340  HGB 11.6* 11.8* 11.8*  HCT 34.7* 34.9* 35.6*  PLT 229 214 226  LABPROT 26.8* 26.2* 33.0*  INR 2.50 2.43 3.26  CREATININE 1.26* 1.50* 1.60*    Estimated Creatinine Clearance: 41.2 mL/min (A) (by C-G formula based on SCr of 1.6 mg/dL (H)).   Assessment: 71 yom being evaluated for acute on chronic CHF, on warfarin PTA for chronic DVTs. Home regimen is 5 mg daily except 2.5 mg on MWF. Following with Dr Oretha Ellis at [facility]. Last dose was 2.5 mg on 1/11 PTA.  INR jumped 3.2 now supratherapeutic with home dose and new amiodarone. CBC stable. No signs/symptoms of bleeding.      Goal of Therapy:  INR 2-3 Monitor platelets by anticoagulation protocol: Yes   Plan:  Hold warfarin today Monitor daily INR, CBC, s/sx bleeding  Bonnita Nasuti Pharm.D. CPP, BCPS Clinical Pharmacist [phone] 07/30/2017 9:36 AM

## 2017-07-31 LAB — BASIC METABOLIC PANEL
ANION GAP: 11 (ref 5–15)
BUN: 31 mg/dL — ABNORMAL HIGH (ref 6–20)
CALCIUM: 8.8 mg/dL — AB (ref 8.9–10.3)
CO2: 29 mmol/L (ref 22–32)
Chloride: 92 mmol/L — ABNORMAL LOW (ref 101–111)
Creatinine, Ser: 1.71 mg/dL — ABNORMAL HIGH (ref 0.61–1.24)
GFR, EST AFRICAN AMERICAN: 42 mL/min — AB (ref >60)
GFR, EST NON AFRICAN AMERICAN: 36 mL/min — AB (ref >60)
GLUCOSE: 119 mg/dL — AB (ref 65–99)
POTASSIUM: 3.1 mmol/L — AB (ref 3.5–5.1)
Sodium: 132 mmol/L — ABNORMAL LOW (ref 135–145)

## 2017-07-31 LAB — PROTIME-INR
INR: 2.37
PROTHROMBIN TIME: 25.7 s — AB (ref 11.4–15.2)

## 2017-07-31 LAB — COOXEMETRY PANEL
Carboxyhemoglobin: 1.3 % (ref 0.5–1.5)
METHEMOGLOBIN: 1 % (ref 0.0–1.5)
O2 SAT: 82 %
Total hemoglobin: 12 g/dL (ref 12.0–16.0)

## 2017-07-31 MED ORDER — WARFARIN SODIUM 5 MG PO TABS
5.0000 mg | ORAL_TABLET | Freq: Once | ORAL | Status: AC
  Administered 2017-07-31: 5 mg via ORAL
  Filled 2017-07-31: qty 1

## 2017-07-31 MED ORDER — LOSARTAN POTASSIUM 25 MG PO TABS
12.5000 mg | ORAL_TABLET | Freq: Two times a day (BID) | ORAL | Status: DC
  Administered 2017-07-31 – 2017-08-01 (×3): 12.5 mg via ORAL
  Filled 2017-07-31 (×3): qty 1

## 2017-07-31 NOTE — Progress Notes (Signed)
[place] for warfarin Indication: DVT  Allergies  Allergen Reactions  . Actos [Pioglitazone] Other (See Comments)    Slow HR and pain in face   . Anesthetics, Halogenated     Slept too long  . Crestor [Rosuvastatin Calcium]     Pain   . Sulfa Antibiotics Other (See Comments)    hallucinations  . Dye Fdc Red [Red Dye] Rash    # 40    Patient Measurements: Height: 5\' 7"  (170.2 cm) Weight: 213 lb 3.2 oz (96.7 kg) IBW/kg (Calculated) : 66.1  Vital Signs: Temp: 97.9 F (36.6 C) (01/18 0420) Temp Source: Oral (01/18 0420) BP: 99/64 (01/18 0420) Pulse Rate: 82 (01/18 0420)  Labs: Recent Labs    07/29/17 0253 07/30/17 0340 07/31/17 0500  HGB 11.8* 11.8*  --   HCT 34.9* 35.6*  --   PLT 214 226  --   LABPROT 26.2* 33.0* 25.7*  INR 2.43 3.26 2.37  CREATININE 1.50* 1.60* 1.71*    Estimated Creatinine Clearance: 38.2 mL/min (A) (by C-G formula based on SCr of 1.71 mg/dL (H)).   Assessment: 43 yom being evaluated for acute on chronic CHF, on warfarin PTA for chronic DVTs. Home regimen is 5 mg daily except 2.5 mg on MWF. Following with Dr Oretha Ellis at [facility]. Last dose was 2.5 mg on 1/11 PTA.  INR jumped 2.4> 3.2> dose held and back down 2.4 - will restart warfarin today.  CBC stable. No signs/symptoms of bleeding.      Goal of Therapy:  INR 2-3 Monitor platelets by anticoagulation protocol: Yes   Plan:  warfarin 5mg  x1 today Monitor daily INR, CBC, s/sx bleeding  Bonnita Nasuti Pharm.D. CPP, BCPS Clinical Pharmacist [phone] 07/31/2017 9:01 AM

## 2017-07-31 NOTE — Progress Notes (Addendum)
Advanced Heart Failure Rounding Note  Primary Cardiologist: Dr. Acie Fredrickson  Subjective:    Feeling better. Denies SOB getting around room. No lightheadedness or dizziness.   Coox 82% on milrinone 0.25 mcg/kg/min. Still with frequent PVCs despite IV amio.   Negative 2.1 L and down another 4 lbs. CVP 4-5 cm  Creatinine 1.6 -> 1.71.   Objective:   Weight Range: 213 lb 3.2 oz (96.7 kg) Body mass index is 33.39 kg/m.   Vital Signs:   Temp:  [97.6 F (36.4 C)-98.8 F (37.1 C)] 97.9 F (36.6 C) (01/18 0420) Pulse Rate:  [82-97] 82 (01/18 0420) Resp:  [17-30] 30 (01/18 0420) BP: (94-108)/(61-66) 99/64 (01/18 0420) SpO2:  [91 %-96 %] 91 % (01/18 0420) Weight:  [213 lb 3.2 oz (96.7 kg)] 213 lb 3.2 oz (96.7 kg) (01/18 0440) Last BM Date: 07/27/17  Weight change: Filed Weights   07/29/17 0457 07/30/17 0340 07/31/17 0440  Weight: 221 lb 12.5 oz (100.6 kg) 217 lb 6 oz (98.6 kg) 213 lb 3.2 oz (96.7 kg)   Intake/Output:   Intake/Output Summary (Last 24 hours) at 07/31/2017 0853 Last data filed at 07/31/2017 0600 Gross per 24 hour  Intake 1449.2 ml  Output 3375 ml  Net -1925.8 ml    Physical Exam   General: Elderly sitting up on side of bed. No resp difficulty. HEENT: Normal Neck: Supple. JVP does not appear elevated. Carotids 2+ bilat; no bruits. No thyromegaly or nodule noted. Cor: PMI lateral. Frequent ectopy. +S3 Lungs: Diminished basilar sounds.  Abdomen: Soft, non-tender, non-distended, no HSM. No bruits or masses. +BS  Extremities: No cyanosis, clubbing, or rash. Trace ankle edema. Unna boots in place.   Neuro: Alert & orientedx3, cranial nerves grossly intact. moves all 4 extremities w/o difficulty. Affect pleasant   Telemetry   NSR 80-90s, with frequent ectopy. Personally reviewed.   EKG    No new tracings.  Labs    CBC Recent Labs    07/29/17 0253 07/30/17 0340  WBC 9.1 10.3  HGB 11.8* 11.8*  HCT 34.9* 35.6*  MCV 79.3 79.6  PLT 214 540   Basic  Metabolic Panel Recent Labs    07/28/17 1218  07/30/17 0340 07/31/17 0500  NA  --    < > 129* 132*  K  --    < > 3.3* 3.1*  CL  --    < > 91* 92*  CO2  --    < > 24 29  GLUCOSE  --    < > 250* 119*  BUN  --    < > 29* 31*  CREATININE  --    < > 1.60* 1.71*  CALCIUM  --    < > 8.7* 8.8*  MG 1.9  --  2.1  --    < > = values in this interval not displayed.   Liver Function Tests No results for input(s): AST, ALT, ALKPHOS, BILITOT, PROT, ALBUMIN in the last 72 hours. No results for input(s): LIPASE, AMYLASE in the last 72 hours. Cardiac Enzymes No results for input(s): CKTOTAL, CKMB, CKMBINDEX, TROPONINI in the last 72 hours.  BNP: BNP (last 3 results) Recent Labs    07/25/17 1208  BNP 1,864.1*    ProBNP (last 3 results) No results for input(s): PROBNP in the last 8760 hours.   D-Dimer No results for input(s): DDIMER in the last 72 hours. Hemoglobin A1C No results for input(s): HGBA1C in the last 72 hours. Fasting Lipid Panel No results for  input(s): CHOL, HDL, LDLCALC, TRIG, CHOLHDL, LDLDIRECT in the last 72 hours. Thyroid Function Tests No results for input(s): TSH, T4TOTAL, T3FREE, THYROIDAB in the last 72 hours.  Invalid input(s): FREET3  Other results:   Imaging    No results found.   Medications:     Scheduled Medications: . cholecalciferol  2,000 Units Oral BID  . digoxin  0.125 mg Oral Daily  . furosemide  80 mg Intravenous BID  . potassium chloride  40 mEq Oral BID  . sodium chloride flush  3 mL Intravenous Q12H  . spironolactone  12.5 mg Oral Daily  . warfarin  5 mg Oral ONCE-1800  . Warfarin - Pharmacist Dosing Inpatient   Does not apply q1800    Infusions: . sodium chloride Stopped (07/29/17 0400)  . amiodarone 30 mg/hr (07/31/17 0600)  . milrinone 0.25 mcg/kg/min (07/31/17 0600)    PRN Medications: sodium chloride, acetaminophen, ondansetron (ZOFRAN) IV, polyethylene glycol, sodium chloride, sodium chloride flush, sodium chloride  flush  Patient Profile   Michael Randolph is a 83 y.o. male with past medical history of nonischemic cardiomyopathy admitted with acute on chronic systolic congestive heart failure.  HF team consulted 07/28/17 with low output CHF  Assessment/Plan   1. Acute on chronic systolic congestive heart failure -> NICM by cath 2016. - Echo 07/16/2017 LVEF 25%, Grade 2 DD, Trivial AI, Mod MR, Sev LAE - Unclear etiology. Did have severe Flu episode 2015. ? PVC burden. Consider cMRI. - Coox 82.0%. Cut milrinone to 0.125 mcg/kg/min.  - Volume status much improved. CVP 4-5 - Stop IV lasix. Will plan on going back on po lasix tomorrow am pending creatinine.  - Continue unna boots. - Will add losartan 12.5 mg BID today. Would eventually like to get on Entresto. - Continue spiro 12.5 mg daily for now.  - Continue digoxin 0.125 mg daily.  2.PVCs  -Unclear chronicity, PTA vs after mirlinone. - Continue IV amiodarone while on milrinone. - Goal K >  4.0 and Mg 2.0.   3. AKI - Creatinine 1.1 -> 1.26 -> 1.50 -> 1.6 -> 1.71 - Continue to follow closely with diuresis.  4.Moderate MR  - By echo this admit. No change.   5.Hypokalemia - 3.1 this am. Will supp today.   6.Hyponatremia- - 132 this am.  - Continue to follow closely. - Continue fluid restrict.  7.History of DVT- - On long term coumadin. - INR 2.37 this am. Dosing per pharm.   8. Left carotid bruit - Carotid dopplers 03/2017 1-39% bilateral ICA stenosis. No change.   Continues to improve. Start milrinone wean. Home Monday/Tuesday.   Medication concerns reviewed with patient and pharmacy team. Barriers identified: None at this time.  Length of Stay: [address]  Annamaria Helling  07/31/2017, 8:53 AM  Advanced Heart Failure Team Pager [phone] (M-F; 7a - 4p)  Please contact [facility] for night-coverage after hours (4p -7a ) and weekends on [email or web address]

## 2017-08-01 LAB — BASIC METABOLIC PANEL
Anion gap: 13 (ref 5–15)
BUN: 34 mg/dL — AB (ref 6–20)
CO2: 28 mmol/L (ref 22–32)
CREATININE: 1.55 mg/dL — AB (ref 0.61–1.24)
Calcium: 8.6 mg/dL — ABNORMAL LOW (ref 8.9–10.3)
Chloride: 91 mmol/L — ABNORMAL LOW (ref 101–111)
GFR calc Af Amer: 47 mL/min — ABNORMAL LOW (ref >60)
GFR calc non Af Amer: 41 mL/min — ABNORMAL LOW (ref >60)
GLUCOSE: 146 mg/dL — AB (ref 65–99)
POTASSIUM: 3.1 mmol/L — AB (ref 3.5–5.1)
SODIUM: 132 mmol/L — AB (ref 135–145)

## 2017-08-01 LAB — COOXEMETRY PANEL
Carboxyhemoglobin: 1.1 % (ref 0.5–1.5)
Methemoglobin: 1.1 % (ref 0.0–1.5)
O2 SAT: 54.1 %
TOTAL HEMOGLOBIN: 12.1 g/dL (ref 12.0–16.0)

## 2017-08-01 LAB — PROTIME-INR
INR: 2.11
PROTHROMBIN TIME: 23.4 s — AB (ref 11.4–15.2)

## 2017-08-01 LAB — DIGOXIN LEVEL: Digoxin Level: 0.5 ng/mL — ABNORMAL LOW (ref 0.8–2.0)

## 2017-08-01 MED ORDER — WARFARIN SODIUM 5 MG PO TABS
5.0000 mg | ORAL_TABLET | Freq: Once | ORAL | Status: AC
  Administered 2017-08-01: 5 mg via ORAL
  Filled 2017-08-01: qty 1

## 2017-08-01 MED ORDER — POTASSIUM CHLORIDE CRYS ER 20 MEQ PO TBCR
20.0000 meq | EXTENDED_RELEASE_TABLET | Freq: Two times a day (BID) | ORAL | Status: DC
  Administered 2017-08-01 – 2017-08-04 (×6): 20 meq via ORAL
  Filled 2017-08-01 (×6): qty 1

## 2017-08-01 MED ORDER — FUROSEMIDE 80 MG PO TABS
80.0000 mg | ORAL_TABLET | Freq: Every day | ORAL | Status: DC
  Administered 2017-08-01 – 2017-08-02 (×2): 80 mg via ORAL
  Filled 2017-08-01 (×2): qty 1

## 2017-08-01 MED ORDER — SPIRONOLACTONE 25 MG PO TABS
25.0000 mg | ORAL_TABLET | Freq: Every day | ORAL | Status: DC
  Administered 2017-08-02 – 2017-08-05 (×4): 25 mg via ORAL
  Filled 2017-08-01 (×4): qty 1

## 2017-08-01 MED ORDER — POTASSIUM CHLORIDE CRYS ER 20 MEQ PO TBCR
40.0000 meq | EXTENDED_RELEASE_TABLET | Freq: Once | ORAL | Status: AC
  Administered 2017-08-01: 40 meq via ORAL
  Filled 2017-08-01: qty 2

## 2017-08-01 MED ORDER — LOSARTAN POTASSIUM 25 MG PO TABS
25.0000 mg | ORAL_TABLET | Freq: Two times a day (BID) | ORAL | Status: DC
  Administered 2017-08-01 – 2017-08-03 (×4): 25 mg via ORAL
  Filled 2017-08-01 (×4): qty 1

## 2017-08-01 NOTE — Progress Notes (Signed)
Advanced Heart Failure Rounding Note  Primary Cardiologist: Dr. Acie Fredrickson  Subjective:    Milrinone cut back to 0.125 yesterday. Co-ox today 54%. Breathing ok but mild SOB with exertion. No CP.   Back on oral diuretics. Weight up 1 pound. Creatinine improved 1.7-> 1.55. K 3.1 CVP 7-8   Objective:   Weight Range: 97.2 kg (214 lb 4.6 oz) Body mass index is 33.56 kg/m.   Vital Signs:   Temp:  [97.6 F (36.4 C)-98.9 F (37.2 C)] 98.4 F (36.9 C) (01/19 0858) Pulse Rate:  [77-116] 77 (01/19 0858) Resp:  [19-28] 19 (01/19 0858) BP: (94-108)/(57-78) 108/57 (01/19 0858) SpO2:  [96 %-99 %] 97 % (01/19 0858) Weight:  [97.2 kg (214 lb 4.6 oz)] 97.2 kg (214 lb 4.6 oz) (01/19 0525) Last BM Date: 07/27/17  Weight change: Filed Weights   07/30/17 0340 07/31/17 0440 08/01/17 0525  Weight: 98.6 kg (217 lb 6 oz) 96.7 kg (213 lb 3.2 oz) 97.2 kg (214 lb 4.6 oz)   Intake/Output:   Intake/Output Summary (Last 24 hours) at 08/01/2017 0920 Last data filed at 08/01/2017 0858 Gross per 24 hour  Intake 1736.19 ml  Output 1926 ml  Net -189.81 ml    Physical Exam   General:  Sitting in chair No resp difficulty HEENT: normal Neck: supple. JVP 8 Carotids 2+ bilat; no bruits. No lymphadenopathy or thryomegaly appreciated. Cor: PMI laterally displaced. Irr egular rate & rhythm. +s3 Lungs: clear Abdomen: soft, nontender, nondistended. No hepatosplenomegaly. No bruits or masses. Good bowel sounds. Extremities: no cyanosis, clubbing, rash, + UNNA boots trace edema Neuro: alert & orientedx3, cranial nerves grossly intact. moves all 4 extremities w/o difficulty. Affect pleasant   Telemetry   NSR 70-80s, with frequent PVCs. Personally reviewed.   EKG    No new tracings.  Labs    CBC Recent Labs    07/30/17 0340  WBC 10.3  HGB 11.8*  HCT 35.6*  MCV 79.6  PLT 622   Basic Metabolic Panel Recent Labs    07/30/17 0340 07/31/17 0500 08/01/17 0520  NA 129* 132* 132*  K 3.3* 3.1*  3.1*  CL 91* 92* 91*  CO2 24 29 28   GLUCOSE 250* 119* 146*  BUN 29* 31* 34*  CREATININE 1.60* 1.71* 1.55*  CALCIUM 8.7* 8.8* 8.6*  MG 2.1  --   --    Liver Function Tests No results for input(s): AST, ALT, ALKPHOS, BILITOT, PROT, ALBUMIN in the last 72 hours. No results for input(s): LIPASE, AMYLASE in the last 72 hours. Cardiac Enzymes No results for input(s): CKTOTAL, CKMB, CKMBINDEX, TROPONINI in the last 72 hours.  BNP: BNP (last 3 results) Recent Labs    07/25/17 1208  BNP 1,864.1*    ProBNP (last 3 results) No results for input(s): PROBNP in the last 8760 hours.   D-Dimer No results for input(s): DDIMER in the last 72 hours. Hemoglobin A1C No results for input(s): HGBA1C in the last 72 hours. Fasting Lipid Panel No results for input(s): CHOL, HDL, LDLCALC, TRIG, CHOLHDL, LDLDIRECT in the last 72 hours. Thyroid Function Tests No results for input(s): TSH, T4TOTAL, T3FREE, THYROIDAB in the last 72 hours.  Invalid input(s): FREET3  Other results:   Imaging    No results found.   Medications:     Scheduled Medications: . cholecalciferol  2,000 Units Oral BID  . digoxin  0.125 mg Oral Daily  . losartan  12.5 mg Oral BID  . potassium chloride  40 mEq Oral BID  .  sodium chloride flush  3 mL Intravenous Q12H  . spironolactone  12.5 mg Oral Daily  . Warfarin - Pharmacist Dosing Inpatient   Does not apply q1800    Infusions: . sodium chloride Stopped (07/29/17 0400)  . amiodarone 30 mg/hr (08/01/17 0108)  . milrinone 0.125 mcg/kg/min (07/31/17 1434)    PRN Medications: sodium chloride, acetaminophen, ondansetron (ZOFRAN) IV, polyethylene glycol, sodium chloride, sodium chloride flush, sodium chloride flush  Patient Profile   Michael Randolph is a 83 y.o. male with past medical history of nonischemic cardiomyopathy admitted with acute on chronic systolic congestive heart failure.  HF team consulted 07/28/17 with low output CHF  Assessment/Plan    1. Acute on chronic systolic congestive heart failure -> NICM by cath 2016. - Echo 07/16/2017 LVEF 25%, Grade 2 DD, Trivial AI, Mod MR, Sev LAE - Unclear etiology. Did have severe Flu episode 2015. ? PVC burden. Consider cMRI. - Milrinone cut to 0.125 mcg/kg/min yesterday. Co-ox down to 54%. Will continue at current dose. Continue slow wean  - CVP 7-8 - Off IV lasix. Will start po lasix  - Continue unna boots. - Increase losartan to 25 mg BID today. Would eventually like to get on Entresto - possibly in am.  - Increase spiro to 25 mg daily for now.  - Continue digoxin 0.125 mg daily.  2.PVCs, frequent (poymorphic) -Unclear chronicity, PTA vs after mirlinone. Seems to have preceded - Continue IV amiodarone while on milrinone. Hopefully will improve off milrinone  - Goal K >  4.0 and Mg 2.0.   3. AKI - Creatinine 1.1 -> 1.26 -> 1.50 -> 1.6 -> 1.71-> 1.55 - Continue to follow closely with diuresis.  4.Moderate MR  - By echo this admit. No change.   5.Hypokalemia - 3.1 this am. Will supp today. Increase spiro to 25  6.Hyponatremia- - Stable at 132 this am.  - Continue to follow closely. - Continue fluid restrict.  7.History of DVT- - On long term coumadin. - INR 2.11 this am. No bleeding.  Dosing per pharm.   8. Left carotid bruit - Carotid dopplers 03/2017 1-39% bilateral ICA stenosis. No change.   Continues to improve. Start milrinone wean. Home Monday/Tuesday.   Medication concerns reviewed with patient and pharmacy team. Barriers identified: None at this time.  Length of Stay: [place], MD  08/01/2017, 9:20 AM  Advanced Heart Failure Team Pager [phone] (M-F; 7a - 4p)  Please contact [facility] for night-coverage after hours (4p -7a ) and weekends on [email or web address]

## 2017-08-01 NOTE — Progress Notes (Signed)
CARDIAC REHAB PHASE I   PRE:  Rate/Rhythm: 81 SR with PVCs    BP: sitting 93/57    SaO2: 95 RA  MODE:  Ambulation: 680 ft   POST:  Rate/Rhythm: 100 ST (less PVCs)    BP: sitting 112/85     SaO2: 97 RA  Tolerated well, no c/o. He has been walking independently. Reviewed HF, low sodium, walking daily, daily wts. Pt not interested in CRPII as [place]. Left brochure. Encouraged more walking independently. Will f/u as low priority. [place], ACSM 08/01/2017 2:33 PM

## 2017-08-01 NOTE — Progress Notes (Signed)
[place] for warfarin Indication: DVT  Allergies  Allergen Reactions  . Actos [Pioglitazone] Other (See Comments)    Slow HR and pain in face   . Anesthetics, Halogenated     Slept too long  . Crestor [Rosuvastatin Calcium]     Pain   . Sulfa Antibiotics Other (See Comments)    hallucinations  . Dye Fdc Red [Red Dye] Rash    # 40    Patient Measurements: Height: 5\' 7"  (170.2 cm) Weight: 214 lb 4.6 oz (97.2 kg) IBW/kg (Calculated) : 66.1  Vital Signs: Temp: 98.5 F (36.9 C) (01/19 1145) Temp Source: Oral (01/19 1145) BP: 94/74 (01/19 1145) Pulse Rate: 78 (01/19 1145)  Labs: Recent Labs    07/30/17 0340 07/31/17 0500 08/01/17 0520  HGB 11.8*  --   --   HCT 35.6*  --   --   PLT 226  --   --   LABPROT 33.0* 25.7* 23.4*  INR 3.26 2.37 2.11  CREATININE 1.60* 1.71* 1.55*    Estimated Creatinine Clearance: 42.2 mL/min (A) (by C-G formula based on SCr of 1.55 mg/dL (H)).   Assessment: 71 yom being evaluated for acute on chronic CHF, on warfarin PTA for chronic DVTs. Home regimen is 5 mg daily except 2.5 mg on MWF. Following with Dr Oretha Ellis at [facility]. Last dose was 2.5 mg on 1/11 PTA.  INR seems to be stabilizing 2.4> 3.2> 2.3>2.1. Dose held 1/17. Will give 5mg  again today. CBC stable. No signs/symptoms of bleeding.      Goal of Therapy:  INR 2-3 Monitor platelets by anticoagulation protocol: Yes   Plan:  warfarin 5mg  x1 today Monitor daily INR, CBC, s/sx bleeding  Erin Hearing PharmD., BCPS Clinical Pharmacist 08/01/2017 1:14 PM

## 2017-08-02 LAB — BASIC METABOLIC PANEL
Anion gap: 13 (ref 5–15)
BUN: 35 mg/dL — ABNORMAL HIGH (ref 6–20)
CALCIUM: 8.9 mg/dL (ref 8.9–10.3)
CHLORIDE: 93 mmol/L — AB (ref 101–111)
CO2: 27 mmol/L (ref 22–32)
CREATININE: 1.67 mg/dL — AB (ref 0.61–1.24)
GFR calc Af Amer: 43 mL/min — ABNORMAL LOW (ref >60)
GFR calc non Af Amer: 37 mL/min — ABNORMAL LOW (ref >60)
GLUCOSE: 123 mg/dL — AB (ref 65–99)
Potassium: 3.6 mmol/L (ref 3.5–5.1)
Sodium: 133 mmol/L — ABNORMAL LOW (ref 135–145)

## 2017-08-02 LAB — COOXEMETRY PANEL
CARBOXYHEMOGLOBIN: 1.1 % (ref 0.5–1.5)
Methemoglobin: 1.1 % (ref 0.0–1.5)
O2 SAT: 57.2 %
Total hemoglobin: 12.5 g/dL (ref 12.0–16.0)

## 2017-08-02 LAB — PROTIME-INR
INR: 2.01
PROTHROMBIN TIME: 22.6 s — AB (ref 11.4–15.2)

## 2017-08-02 LAB — MAGNESIUM: Magnesium: 2 mg/dL (ref 1.7–2.4)

## 2017-08-02 MED ORDER — METOLAZONE 2.5 MG PO TABS
2.5000 mg | ORAL_TABLET | Freq: Once | ORAL | Status: AC
  Administered 2017-08-02: 2.5 mg via ORAL
  Filled 2017-08-02: qty 1

## 2017-08-02 MED ORDER — TORSEMIDE 20 MG PO TABS
40.0000 mg | ORAL_TABLET | Freq: Two times a day (BID) | ORAL | Status: DC
  Administered 2017-08-02 – 2017-08-03 (×2): 40 mg via ORAL
  Filled 2017-08-02 (×2): qty 2

## 2017-08-02 MED ORDER — WARFARIN SODIUM 5 MG PO TABS
5.0000 mg | ORAL_TABLET | Freq: Once | ORAL | Status: AC
  Administered 2017-08-02: 5 mg via ORAL
  Filled 2017-08-02: qty 1

## 2017-08-02 NOTE — Progress Notes (Signed)
[place] for warfarin Indication: DVT  Allergies  Allergen Reactions  . Actos [Pioglitazone] Other (See Comments)    Slow HR and pain in face   . Anesthetics, Halogenated     Slept too long  . Crestor [Rosuvastatin Calcium]     Pain   . Sulfa Antibiotics Other (See Comments)    hallucinations  . Dye Fdc Red [Red Dye] Rash    # 40    Patient Measurements: Height: 5\' 7"  (170.2 cm) Weight: 215 lb 6.2 oz (97.7 kg) IBW/kg (Calculated) : 66.1  Vital Signs: Temp: 98 F (36.7 C) (01/20 0758) Temp Source: Oral (01/20 0758) BP: 90/68 (01/20 0758) Pulse Rate: 75 (01/20 0758)  Labs: Recent Labs    07/31/17 0500 08/01/17 0520 08/02/17 0600  LABPROT 25.7* 23.4* 22.6*  INR 2.37 2.11 2.01  CREATININE 1.71* 1.55* 1.67*    Estimated Creatinine Clearance: 39.3 mL/min (A) (by C-G formula based on SCr of 1.67 mg/dL (H)).   Assessment: 57 yom being evaluated for acute on chronic CHF, on warfarin PTA for chronic DVTs. Home regimen is 5 mg daily except 2.5 mg on MWF. Following with Dr Oretha Ellis at [facility]. Last dose was 2.5 mg on 1/11 PTA.  INR seems to be stabilizing, 2.0 this morning. Dose held 1/17. Will continue with 5mg  today. CBC stable. No signs/symptoms of bleeding.      Goal of Therapy:  INR 2-3 Monitor platelets by anticoagulation protocol: Yes   Plan:  warfarin 5mg  today Monitor daily INR, CBC, s/sx bleeding  Erin Hearing PharmD., BCPS Clinical Pharmacist 08/02/2017 10:03 AM

## 2017-08-02 NOTE — Progress Notes (Signed)
Advanced Heart Failure Rounding Note  Primary Cardiologist: Dr. Acie Fredrickson  Subjective:    Milrinone cut back to 0.125 on Friday. Co-ox today not drawn   Fatigued bt able to walk halls with CR. Mild dyspnea with exertion. No CP.   Remains on oral diuretics. Weight up another pound. Creatinine stable at 1.6. K 3.6 CVP up to 10   Objective:   Weight Range: 97.7 kg (215 lb 6.2 oz) Body mass index is 33.73 kg/m.   Vital Signs:   Temp:  [97.8 F (36.6 C)-99 F (37.2 C)] 98 F (36.7 C) (01/20 0758) Pulse Rate:  [75-83] 75 (01/20 0758) Resp:  [19-32] 19 (01/20 0758) BP: (90-108)/(57-74) 90/68 (01/20 0758) SpO2:  [92 %-98 %] 92 % (01/20 0758) Weight:  [97.7 kg (215 lb 6.2 oz)] 97.7 kg (215 lb 6.2 oz) (01/20 0324) Last BM Date: 08/02/17  Weight change: Filed Weights   07/31/17 0440 08/01/17 0525 08/02/17 0324  Weight: 96.7 kg (213 lb 3.2 oz) 97.2 kg (214 lb 4.6 oz) 97.7 kg (215 lb 6.2 oz)   Intake/Output:   Intake/Output Summary (Last 24 hours) at 08/02/2017 0846 Last data filed at 08/02/2017 0800 Gross per 24 hour  Intake 1472.5 ml  Output 1453 ml  Net 19.5 ml    Physical Exam   General:  Sitting in chair. No resp difficulty HEENT: normal Neck: supple. JVP 10 Carotids 2+ bilat; no bruits. No lymphadenopathy or thryomegaly appreciated. Cor: PMI laterally displaced. Irregular . No rubs, gallops or murmurs. Lungs: clear with decreased BS at bases  Abdomen: soft, nontender, nondistended. No hepatosplenomegaly. No bruits or masses. Good bowel sounds. Extremities: no cyanosis, clubbing, rash, 2+ edema. + UNNA boots Neuro: alert & orientedx3, cranial nerves grossly intact. moves all 4 extremities w/o difficulty. Affect pleasant   Telemetry   NSR 70-80s, frequent PVCs (but reduced) Personally reviewed   EKG    No new tracings.  Labs    CBC No results for input(s): WBC, NEUTROABS, HGB, HCT, MCV, PLT in the last 72 hours. Basic Metabolic Panel Recent Labs   08/01/17 0520 08/02/17 0600  NA 132* 133*  K 3.1* 3.6  CL 91* 93*  CO2 28 27  GLUCOSE 146* 123*  BUN 34* 35*  CREATININE 1.55* 1.67*  CALCIUM 8.6* 8.9  MG  --  2.0   Liver Function Tests No results for input(s): AST, ALT, ALKPHOS, BILITOT, PROT, ALBUMIN in the last 72 hours. No results for input(s): LIPASE, AMYLASE in the last 72 hours. Cardiac Enzymes No results for input(s): CKTOTAL, CKMB, CKMBINDEX, TROPONINI in the last 72 hours.  BNP: BNP (last 3 results) Recent Labs    07/25/17 1208  BNP 1,864.1*    ProBNP (last 3 results) No results for input(s): PROBNP in the last 8760 hours.   D-Dimer No results for input(s): DDIMER in the last 72 hours. Hemoglobin A1C No results for input(s): HGBA1C in the last 72 hours. Fasting Lipid Panel No results for input(s): CHOL, HDL, LDLCALC, TRIG, CHOLHDL, LDLDIRECT in the last 72 hours. Thyroid Function Tests No results for input(s): TSH, T4TOTAL, T3FREE, THYROIDAB in the last 72 hours.  Invalid input(s): FREET3  Other results:   Imaging    No results found.   Medications:     Scheduled Medications: . cholecalciferol  2,000 Units Oral BID  . digoxin  0.125 mg Oral Daily  . furosemide  80 mg Oral Daily  . losartan  25 mg Oral BID  . potassium chloride  20  mEq Oral BID  . sodium chloride flush  3 mL Intravenous Q12H  . spironolactone  25 mg Oral Daily  . Warfarin - Pharmacist Dosing Inpatient   Does not apply q1800    Infusions: . sodium chloride Stopped (07/29/17 0400)  . amiodarone 30 mg/hr (08/01/17 1900)  . milrinone 0.125 mcg/kg/min (08/01/17 1900)    PRN Medications: sodium chloride, acetaminophen, ondansetron (ZOFRAN) IV, polyethylene glycol, sodium chloride, sodium chloride flush, sodium chloride flush  Patient Profile   Michael Randolph is a 83 y.o. male with past medical history of nonischemic cardiomyopathy admitted with acute on chronic systolic congestive heart failure.  HF team consulted  07/28/17 with low output CHF  Assessment/Plan   1. Acute on chronic systolic congestive heart failure -> NICM by cath 2016. - Echo 07/16/2017 LVEF 25%, Grade 2 DD, Trivial AI, Mod MR, Sev LAE - Unclear etiology. Did have severe Flu episode 2015. ? PVC burden. Consider cMRI. - Milrinone cut to 0.125 mcg/kg/min on Friday. Co-ox pending. Suspect it is down.. Will continue at current dose. Continue slow wean. Not good candidate for home milrinone with severe ectopyon 0.25 - CVP climbing on po lasix. Will give metolazone 2.5 x 1 today - Switch lasix to torsemide 40 bid - Continue unna boots. - Continue losartan to 25 mg BID today. Would eventually like to get on Entresto but BP too low - Continue spiro 25 mg daily  - Continue digoxin 0.125 mg daily. Check level in am  2.PVCs, frequent (poymorphic) -Unclear chronicity, PTA vs after mirlinone. Seems to have preceded - Continue IV amiodarone while on milrinone. PVC decreased with lower milrinone dose - Goal K >  4.0 and Mg 2.0.   3. AKI - Creatinine 1.1 -> 1.26 -> 1.50 -> 1.6 -> 1.71-> 1.55-> 1.67 - Continue to follow closely with diuresis.  4.Moderate MR  - By echo this admit. No change.   5.Hypokalemia - 3.6 this am.   6.Hyponatremia- - Stable at 133 this am.  - Continue to follow closely. - Continue fluid restrict.  7.History of DVT- - On long term coumadin. - INR 2.01 this am. No bleeding.  Dosing per pharm. Discussed dosing with PharmD personally.  8. Left carotid bruit - Carotid dopplers 03/2017 1-39% bilateral ICA stenosis. No change.    Length of Stay: [place], MD  08/02/2017, 8:46 AM  Advanced Heart Failure Team Pager [phone] (M-F; 7a - 4p)  Please contact [facility] for night-coverage after hours (4p -7a ) and weekends on [email or web address]

## 2017-08-03 LAB — BASIC METABOLIC PANEL
Anion gap: 14 (ref 5–15)
Anion gap: 15 (ref 5–15)
BUN: 42 mg/dL — AB (ref 6–20)
BUN: 45 mg/dL — AB (ref 6–20)
CHLORIDE: 93 mmol/L — AB (ref 101–111)
CHLORIDE: 91 mmol/L — AB (ref 101–111)
CO2: 26 mmol/L (ref 22–32)
CO2: 27 mmol/L (ref 22–32)
CREATININE: 1.86 mg/dL — AB (ref 0.61–1.24)
CREATININE: 1.99 mg/dL — AB (ref 0.61–1.24)
Calcium: 8.9 mg/dL (ref 8.9–10.3)
Calcium: 8.9 mg/dL (ref 8.9–10.3)
GFR calc Af Amer: 38 mL/min — ABNORMAL LOW (ref >60)
GFR calc Af Amer: 35 mL/min — ABNORMAL LOW (ref >60)
GFR calc non Af Amer: 33 mL/min — ABNORMAL LOW (ref >60)
GFR, EST NON AFRICAN AMERICAN: 30 mL/min — AB (ref >60)
Glucose, Bld: 156 mg/dL — ABNORMAL HIGH (ref 65–99)
Glucose, Bld: 120 mg/dL — ABNORMAL HIGH (ref 65–99)
Potassium: 3.1 mmol/L — ABNORMAL LOW (ref 3.5–5.1)
Potassium: 3.2 mmol/L — ABNORMAL LOW (ref 3.5–5.1)
SODIUM: 133 mmol/L — AB (ref 135–145)
SODIUM: 133 mmol/L — AB (ref 135–145)

## 2017-08-03 LAB — COOXEMETRY PANEL
Carboxyhemoglobin: 1.2 % (ref 0.5–1.5)
METHEMOGLOBIN: 1 % (ref 0.0–1.5)
O2 Saturation: 58.7 %
TOTAL HEMOGLOBIN: 12.3 g/dL (ref 12.0–16.0)

## 2017-08-03 LAB — PROTIME-INR
INR: 2.21
Prothrombin Time: 24.4 seconds — ABNORMAL HIGH (ref 11.4–15.2)

## 2017-08-03 LAB — DIGOXIN LEVEL: DIGOXIN LVL: 0.9 ng/mL (ref 0.8–2.0)

## 2017-08-03 MED ORDER — WARFARIN SODIUM 2.5 MG PO TABS
2.5000 mg | ORAL_TABLET | Freq: Once | ORAL | Status: AC
  Administered 2017-08-03: 2.5 mg via ORAL
  Filled 2017-08-03: qty 1

## 2017-08-03 MED ORDER — DIGOXIN 125 MCG PO TABS
0.0625 mg | ORAL_TABLET | Freq: Every day | ORAL | Status: DC
  Administered 2017-08-05: 0.0625 mg via ORAL
  Filled 2017-08-03: qty 1

## 2017-08-03 MED ORDER — FUROSEMIDE 10 MG/ML IJ SOLN
80.0000 mg | Freq: Once | INTRAMUSCULAR | Status: AC
  Administered 2017-08-03: 80 mg via INTRAVENOUS
  Filled 2017-08-03: qty 8

## 2017-08-03 NOTE — Progress Notes (Addendum)
Advanced Heart Failure Rounding Note  Primary Cardiologist: Dr. Acie Fredrickson  Subjective:    Remains on milrinone 0.125 mcg + amio 30 mg . Todays CO-OX 53%.   Yesterday diuresed with torsemide  + metolazone. Negative 1.3 liters. Creatinine 1.67->1.86   Denies SOB. CVP 9. Still with PVCs and edema   Objective:   Weight Range: 214 lb 8.1 oz (97.3 kg) Body mass index is 33.6 kg/m.   Vital Signs:   Temp:  [97.5 F (36.4 C)-98 F (36.7 C)] 97.9 F (36.6 C) (01/21 0751) Pulse Rate:  [68-79] 75 (01/21 0751) Resp:  [16-27] 27 (01/21 0428) BP: (82-107)/(51-73) 87/51 (01/21 0428) SpO2:  [94 %-98 %] 95 % (01/21 0428) Weight:  [214 lb 8.1 oz (97.3 kg)] 214 lb 8.1 oz (97.3 kg) (01/21 0428) Last BM Date: 08/02/17  Weight change: Filed Weights   08/01/17 0525 08/02/17 0324 08/03/17 0428  Weight: 214 lb 4.6 oz (97.2 kg) 215 lb 6.2 oz (97.7 kg) 214 lb 8.1 oz (97.3 kg)   Intake/Output:   Intake/Output Summary (Last 24 hours) at 08/03/2017 0943 Last data filed at 08/03/2017 0900 Gross per 24 hour  Intake 1570.5 ml  Output 2315 ml  Net -744.5 ml    Physical Exam   CVP 9-10  General:  Sitting in chair No resp difficulty HEENT: normal Neck: supple. JVP 9-10 Carotids 2+ bilat; no bruits. No lymphadenopathy or thryomegaly appreciated. Cor: PMI laterally displaced. + ectopy Regular rate & rhythm. No rubs, gallops or murmurs. Lungs: clear Abdomen: soft, nontender, nondistended. No hepatosplenomegaly. No bruits or masses. Good bowel sounds. Extremities: no cyanosis, clubbing, rash,  R and L thighs 1-2+ edema. R and LLE unna boots. RUE PICC Neuro: alert & orientedx3, cranial nerves grossly intact. moves all 4 extremities w/o difficulty. Affect pleasant  Telemetry   NSR 70-80s with occasional PVCs. Personally reviewed  EKG    No new tracings.  Labs    CBC No results for input(s): WBC, NEUTROABS, HGB, HCT, MCV, PLT in the last 72 hours. Basic Metabolic Panel Recent Labs   08/02/17 0600 08/03/17 0430  NA 133* 133*  K 3.6 3.1*  CL 93* 93*  CO2 27 26  GLUCOSE 123* 156*  BUN 35* 42*  CREATININE 1.67* 1.86*  CALCIUM 8.9 8.9  MG 2.0  --    Liver Function Tests No results for input(s): AST, ALT, ALKPHOS, BILITOT, PROT, ALBUMIN in the last 72 hours. No results for input(s): LIPASE, AMYLASE in the last 72 hours. Cardiac Enzymes No results for input(s): CKTOTAL, CKMB, CKMBINDEX, TROPONINI in the last 72 hours.  BNP: BNP (last 3 results) Recent Labs    07/25/17 1208  BNP 1,864.1*    ProBNP (last 3 results) No results for input(s): PROBNP in the last 8760 hours.   D-Dimer No results for input(s): DDIMER in the last 72 hours. Hemoglobin A1C No results for input(s): HGBA1C in the last 72 hours. Fasting Lipid Panel No results for input(s): CHOL, HDL, LDLCALC, TRIG, CHOLHDL, LDLDIRECT in the last 72 hours. Thyroid Function Tests No results for input(s): TSH, T4TOTAL, T3FREE, THYROIDAB in the last 72 hours.  Invalid input(s): FREET3  Other results:   Imaging    No results found.   Medications:     Scheduled Medications: . cholecalciferol  2,000 Units Oral BID  . digoxin  0.125 mg Oral Daily  . losartan  25 mg Oral BID  . potassium chloride  20 mEq Oral BID  . sodium chloride flush  3  mL Intravenous Q12H  . spironolactone  25 mg Oral Daily  . torsemide  40 mg Oral BID  . Warfarin - Pharmacist Dosing Inpatient   Does not apply q1800    Infusions: . sodium chloride Stopped (07/29/17 0400)  . amiodarone 30 mg/hr (08/03/17 0900)  . milrinone 0.125 mcg/kg/min (08/03/17 0900)    PRN Medications: sodium chloride, acetaminophen, ondansetron (ZOFRAN) IV, polyethylene glycol, sodium chloride, sodium chloride flush, sodium chloride flush  Patient Profile   Michael Randolph is a 83 y.o. male with past medical history of nonischemic cardiomyopathy admitted with acute on chronic systolic congestive heart failure.  HF team consulted  07/28/17 with low output CHF  Assessment/Plan   1. Acute on chronic systolic congestive heart failure -> NICM by cath 2016. - Echo 07/16/2017 LVEF 25%, Grade 2 DD, Trivial AI, Mod MR, Sev LAE - Unclear etiology. Did have severe Flu episode 2015. ? PVC burden. Consider cMRI. -  Todays 59% on milrinone 0.125 mcg.  - CVP  9-10 . Edema extends to thighs.  - Hold torsemide Give 80 mg IV lasix x1.  - Continue unna boots. - Creatinine trending up. Cut back losartan to 25 mg daily.   - Continue spiro 25 mg daily  - Cut back dig level 0.0625 mg daily. (already had digoxin today) Dig level 0.9 Check level in am  2.PVCs, frequent (poymorphic) -Unclear chronicity, PTA vs after mirlinone. Seems to have preceded - Continue IV amiodarone while on milrinone.  - PVC decreased with lower milrinone dose - Goal K >  4.0 and Mg 2.0.  - Supplement K  3. AKI - Creatinine 1.1 -> 1.26 -> 1.50 -> 1.6 -> 1.71-> 1.55-> 1.67-> 1.86 - BMET daily  4.Moderate MR  - By echo this admit. No change.   5.Hypokalemia - K 3.1  - Supplement K.    6.Hyponatremia- - Stable 133.   - Continue to follow closely. - Continue fluid restrict.  7.History of DVT- - On long term coumadin. - INR 2.21.  No bleeding.  Dosing per pharm. Discussed dosing with PharmD personally.  8. Left carotid bruit - Carotid dopplers 03/2017 1-39% bilateral ICA stenosis. No change.    Length of Stay: [place], NP  08/03/2017, 9:43 AM  Advanced Heart Failure Team Pager [phone] (M-F; 7a - 4p)  Please contact [facility] for night-coverage after hours (4p -7a ) and weekends on [email or web address]   Patient seen and examined with Darrick Grinder, NP. We discussed all aspects of the encounter. I agree with the assessment and plan as stated above.   Remains tenuous. Volume status remains elevated. Creatinine slightly worse. Co-ox marginal but probably ok. Will wean milrinone off. Give one dose IV lasix and the resume torsemide and  follow closely. Would like to avoid home inotoropes if at all possible.  INR 2.2 Discussed dosing with PharmD personally.     Glori Bickers, MD  10:54 AM

## 2017-08-03 NOTE — Progress Notes (Signed)
[place] for warfarin Indication: DVT  Allergies  Allergen Reactions  . Actos [Pioglitazone] Other (See Comments)    Slow HR and pain in face   . Anesthetics, Halogenated     Slept too long  . Crestor [Rosuvastatin Calcium]     Pain   . Sulfa Antibiotics Other (See Comments)    hallucinations  . Dye Fdc Red [Red Dye] Rash    # 40    Patient Measurements: Height: 5\' 7"  (170.2 cm) Weight: 214 lb 8.1 oz (97.3 kg) IBW/kg (Calculated) : 66.1  Vital Signs: Temp: 97.9 F (36.6 C) (01/21 0751) Temp Source: Oral (01/21 0751) BP: 87/51 (01/21 0428) Pulse Rate: 75 (01/21 0751)  Labs: Recent Labs    08/01/17 0520 08/02/17 0600 08/03/17 0430  LABPROT 23.4* 22.6* 24.4*  INR 2.11 2.01 2.21  CREATININE 1.55* 1.67* 1.86*    Estimated Creatinine Clearance: 35.2 mL/min (A) (by C-G formula based on SCr of 1.86 mg/dL (H)).   Assessment: 71 yom being evaluated for acute on chronic CHF, on warfarin PTA for chronic DVTs. Home regimen is 5 mg daily except 2.5 mg on MWF. Following with Dr Oretha Ellis at [facility]. Last dose was 2.5 mg on 1/11 PTA.  INR 2.2 no bleeding noted CBC stable.     Goal of Therapy:  INR 2-3 Monitor platelets by anticoagulation protocol: Yes   Plan:  warfarin 2.5mg  today - probably restart home dosing tomorrow Monitor daily INR, CBC, s/sx bleeding  Bonnita Nasuti Pharm.D. CPP, BCPS Clinical Pharmacist [phone] 08/03/2017 10:11 AM

## 2017-08-04 LAB — COOXEMETRY PANEL
CARBOXYHEMOGLOBIN: 1.4 % (ref 0.5–1.5)
Methemoglobin: 0.8 % (ref 0.0–1.5)
O2 SAT: 53 %
Total hemoglobin: 13.2 g/dL (ref 12.0–16.0)

## 2017-08-04 LAB — BASIC METABOLIC PANEL
ANION GAP: 14 (ref 5–15)
BUN: 48 mg/dL — ABNORMAL HIGH (ref 6–20)
CALCIUM: 9 mg/dL (ref 8.9–10.3)
CO2: 27 mmol/L (ref 22–32)
CREATININE: 1.93 mg/dL — AB (ref 0.61–1.24)
Chloride: 92 mmol/L — ABNORMAL LOW (ref 101–111)
GFR calc non Af Amer: 31 mL/min — ABNORMAL LOW (ref >60)
GFR, EST AFRICAN AMERICAN: 36 mL/min — AB (ref >60)
Glucose, Bld: 154 mg/dL — ABNORMAL HIGH (ref 65–99)
Potassium: 3.2 mmol/L — ABNORMAL LOW (ref 3.5–5.1)
SODIUM: 133 mmol/L — AB (ref 135–145)

## 2017-08-04 LAB — PROTIME-INR
INR: 2.24
PROTHROMBIN TIME: 24.6 s — AB (ref 11.4–15.2)

## 2017-08-04 MED ORDER — LOSARTAN POTASSIUM 25 MG PO TABS
12.5000 mg | ORAL_TABLET | Freq: Every day | ORAL | Status: DC
  Administered 2017-08-04: 12.5 mg via ORAL
  Filled 2017-08-04: qty 1

## 2017-08-04 MED ORDER — WARFARIN SODIUM 2.5 MG PO TABS: 2.5000 mg | ORAL_TABLET | ORAL | Status: DC

## 2017-08-04 MED ORDER — TORSEMIDE 20 MG PO TABS
40.0000 mg | ORAL_TABLET | Freq: Two times a day (BID) | ORAL | Status: DC
  Administered 2017-08-04 – 2017-08-05 (×3): 40 mg via ORAL
  Filled 2017-08-04 (×3): qty 2

## 2017-08-04 MED ORDER — POTASSIUM CHLORIDE CRYS ER 20 MEQ PO TBCR
40.0000 meq | EXTENDED_RELEASE_TABLET | Freq: Two times a day (BID) | ORAL | Status: DC
  Administered 2017-08-04 – 2017-08-05 (×2): 40 meq via ORAL
  Filled 2017-08-04 (×2): qty 2

## 2017-08-04 MED ORDER — AMIODARONE HCL 200 MG PO TABS
200.0000 mg | ORAL_TABLET | Freq: Two times a day (BID) | ORAL | Status: DC
  Administered 2017-08-04 – 2017-08-05 (×3): 200 mg via ORAL
  Filled 2017-08-04 (×3): qty 1

## 2017-08-04 MED ORDER — WARFARIN SODIUM 5 MG PO TABS
5.0000 mg | ORAL_TABLET | ORAL | Status: DC
  Administered 2017-08-04: 5 mg via ORAL
  Filled 2017-08-04: qty 1

## 2017-08-04 MED ORDER — POTASSIUM CHLORIDE CRYS ER 20 MEQ PO TBCR
20.0000 meq | EXTENDED_RELEASE_TABLET | Freq: Once | ORAL | Status: AC
  Administered 2017-08-04: 20 meq via ORAL
  Filled 2017-08-04: qty 1

## 2017-08-04 NOTE — Progress Notes (Signed)
[place] for warfarin Indication: DVT  Allergies  Allergen Reactions  . Actos [Pioglitazone] Other (See Comments)    Slow HR and pain in face   . Anesthetics, Halogenated     Slept too long  . Crestor [Rosuvastatin Calcium]     Pain   . Sulfa Antibiotics Other (See Comments)    hallucinations  . Dye Fdc Red [Red Dye] Rash    # 40    Patient Measurements: Height: 5\' 7"  (170.2 cm) Weight: 211 lb 13.8 oz (96.1 kg) IBW/kg (Calculated) : 66.1  Vital Signs: Temp: 97.7 F (36.5 C) (01/22 0710) Temp Source: Oral (01/22 0710) BP: 88/68 (01/22 0332) Pulse Rate: 68 (01/21 2340)  Labs: Recent Labs    08/02/17 0600 08/03/17 0430 08/03/17 1459 08/04/17 0340  LABPROT 22.6* 24.4*  --  24.6*  INR 2.01 2.21  --  2.24  CREATININE 1.67* 1.86* 1.99* 1.93*    Estimated Creatinine Clearance: 33.7 mL/min (A) (by C-G formula based on SCr of 1.93 mg/dL (H)).   Assessment: 67 yom being evaluated for acute on chronic CHF, on warfarin PTA for chronic DVTs. Home regimen is 5 mg daily except 2.5 mg on MWF. Following with Dr Oretha Ellis at [facility]. Last dose was 2.5 mg on 1/11 PTA.  INR 2.2 no bleeding noted CBC stable.     Goal of Therapy:  INR 2-3 Monitor platelets by anticoagulation protocol: Yes   Plan:  Restart home dose warfarin 2.5mg  MWF / 5mg  TTSS Monitor daily INR, CBC, s/sx bleeding  Bonnita Nasuti Pharm.D. CPP, BCPS Clinical Pharmacist [phone] 08/04/2017 9:57 AM

## 2017-08-04 NOTE — Progress Notes (Signed)
Advanced Heart Failure Rounding Note  Primary Cardiologist: Dr. Acie Fredrickson  Subjective:    Yesterday milrinone stopped. CO-OX 53%. Diuresed with 80 mg IV lasix. Weight down 3 pounds.    Denies SOB/CP.  CVP 9. Walking hall. Weight down 3 pounds after IV lasix yesterday.    Objective:   Weight Range: 211 lb 13.8 oz (96.1 kg) Body mass index is 33.18 kg/m.   Vital Signs:   Temp:  [97.7 F (36.5 C)-97.9 F (36.6 C)] 97.7 F (36.5 C) (01/22 0710) Pulse Rate:  [58-70] 68 (01/21 2340) Resp:  [21-26] 26 (01/22 0332) BP: (87-93)/(53-68) 88/68 (01/22 0332) SpO2:  [98 %-99 %] 98 % (01/22 0332) Weight:  [211 lb 13.8 oz (96.1 kg)] 211 lb 13.8 oz (96.1 kg) (01/22 0439) Last BM Date: 08/03/17  Weight change: Filed Weights   08/02/17 0324 08/03/17 0428 08/04/17 0439  Weight: 215 lb 6.2 oz (97.7 kg) 214 lb 8.1 oz (97.3 kg) 211 lb 13.8 oz (96.1 kg)   Intake/Output:   Intake/Output Summary (Last 24 hours) at 08/04/2017 1000 Last data filed at 08/04/2017 0550 Gross per 24 hour  Intake 555.8 ml  Output 1600 ml  Net -1044.2 ml    Physical Exam  CVP 9-10 sitting in the chair  General:   No resp difficulty. HEENT: normal anicteric  Neck: supple. JVP 9-10 . Carotids 2+ bilat; no bruits. No lymphadenopathy or thryomegaly appreciated. Cor: PMI nondisplaced. Regular rate & rhythm. occasional ectopy. No rubs, gallops or murmurs. Lungs: clear Abdomen: soft, nontender, nondistended. No hepatosplenomegaly. No bruits or masses. Good bowel sounds. Extremities: no cyanosis, clubbing, rash, R and LLE unna boots. 1+ edema  RUE PICC  Neuro: alert & orientedx3, cranial nerves grossly intact. moves all 4 extremities w/o difficulty. Affect pleasant    Telemetry   NSR 70s personally reviewed  EKG    No new tracings.  Labs    CBC No results for input(s): WBC, NEUTROABS, HGB, HCT, MCV, PLT in the last 72 hours. Basic Metabolic Panel Recent Labs    08/02/17 0600  08/03/17 1459  08/04/17 0340  NA 133*   < > 133* 133*  K 3.6   < > 3.2* 3.2*  CL 93*   < > 91* 92*  CO2 27   < > 27 27  GLUCOSE 123*   < > 120* 154*  BUN 35*   < > 45* 48*  CREATININE 1.67*   < > 1.99* 1.93*  CALCIUM 8.9   < > 8.9 9.0  MG 2.0  --   --   --    < > = values in this interval not displayed.   Liver Function Tests No results for input(s): AST, ALT, ALKPHOS, BILITOT, PROT, ALBUMIN in the last 72 hours. No results for input(s): LIPASE, AMYLASE in the last 72 hours. Cardiac Enzymes No results for input(s): CKTOTAL, CKMB, CKMBINDEX, TROPONINI in the last 72 hours.  BNP: BNP (last 3 results) Recent Labs    07/25/17 1208  BNP 1,864.1*    ProBNP (last 3 results) No results for input(s): PROBNP in the last 8760 hours.   D-Dimer No results for input(s): DDIMER in the last 72 hours. Hemoglobin A1C No results for input(s): HGBA1C in the last 72 hours. Fasting Lipid Panel No results for input(s): CHOL, HDL, LDLCALC, TRIG, CHOLHDL, LDLDIRECT in the last 72 hours. Thyroid Function Tests No results for input(s): TSH, T4TOTAL, T3FREE, THYROIDAB in the last 72 hours.  Invalid input(s): FREET3  Other results:  Imaging    No results found.   Medications:     Scheduled Medications: . amiodarone  200 mg Oral BID  . cholecalciferol  2,000 Units Oral BID  . [START ON 08/05/2017] digoxin  0.0625 mg Oral Daily  . potassium chloride  20 mEq Oral Once  . potassium chloride  40 mEq Oral BID  . sodium chloride flush  3 mL Intravenous Q12H  . spironolactone  25 mg Oral Daily  . torsemide  40 mg Oral BID  . [START ON 08/05/2017] warfarin  2.5 mg Oral Once per day on Mon Wed Fri  . warfarin  5 mg Oral Once per day on Sun Tue Thu Sat  . Warfarin - Pharmacist Dosing Inpatient   Does not apply q1800    Infusions: . sodium chloride Stopped (07/29/17 0400)    PRN Medications: sodium chloride, acetaminophen, ondansetron (ZOFRAN) IV, polyethylene glycol, sodium chloride, sodium chloride  flush, sodium chloride flush  Patient Profile   Michael Randolph is a 83 y.o. male with past medical history of nonischemic cardiomyopathy admitted with acute on chronic systolic congestive heart failure.  HF team consulted 07/28/17 with low output CHF  Assessment/Plan   1. Acute on chronic diastolic/systolic congestive heart failure -> NICM by cath 2016. - Echo 07/16/2017 LVEF 25%, Grade 2 DD, Trivial AI, Mod MR, Sev LAE - Unclear etiology. Did have severe Flu episode 2015. ? PVC burden. Consider cMRI. -  CO-OX 53%. Off milrinone.  - CVP 9-10. Stop IV lasix. Start torsemide 40 mg twice a day -Creatinine unchanged 1.9 - Continue  losartan to 25 mg daily.   - Continue spiro 25 mg daily  - Continue lower dose of dig 0.0625 mg daily. Will need dig level at follow up appoinment.   2.PVCs, frequent (poymorphic) -Unclear chronicity, PTA vs after mirlinone. Seems to have preceded - Stop IV amio. Start amio 200 mg twice a day.- PVC decreased with lower milrinone dose - Goal K >  4.0 and Mg 2.0.   3. AKI - Creatinine 1.1 -> 1.26 -> 1.50 -> 1.6 -> 1.71-> 1.55-> 1.67-> 1.86->1.93   4.Moderate MR  - By echo this admit. No change.   5.Hypokalemia - K 3.2  - Supplement K.    6.Hyponatremia- - sodium 133   - Continue to follow closely. - Continue fluid restrict.  7.History of DVT- - On long term coumadin. - INR 2.24.  No bleeding.  Dosing per pharm. Discussed dosing with PharmD personally.  8. Left carotid bruit - Carotid dopplers 03/2017 1-39% bilateral ICA stenosis. No change.   Ambulate today.    Length of Stay: [place], NP  08/04/2017, 10:00 AM  Advanced Heart Failure Team Pager [phone] (M-F; 7a - 4p)  Please contact [facility] for night-coverage after hours (4p -7a ) and weekends on [email or web address]  Patient seen and examined with Darrick Grinder, NP. We discussed all aspects of the encounter. I agree with the assessment and plan as stated above.    Remains  tenuous. Co-ox down to 53% off milrinone. Volume status improved. Will continue to follow. Hopefully co-ox will improve - not good candidate for home inotropes. Volume status ok. Continue torsemide. INR 2.2. Continue coumadin. Supp K. PVCs improved with weaning milrinone. Continue amio.   Glori Bickers, MD  11:37 AM

## 2017-08-04 NOTE — Care Management Note (Signed)
Case Management Note  Patient Details  Name: Michael Randolph MRN: [id number] Date of Birth: 06-21-1935  Subjective/Objective:       Pt admitted with SOB and HF - pt is now on milrinone, amiodarone and IV lasix             Action/Plan:    PTA independent from home with wife.  Pt weighs daily and adheres to low salt diet.  Pt has PCP and denied barriers to obtain/pay for medications   Expected Discharge Date:                  Expected Discharge Plan:  Home/Self Care  In-House Referral:     Discharge planning Services  CM Consult  Post Acute Care Choice:    Choice offered to:     DME Arranged:    DME Agency:     HH Arranged:    HH Agency:     Status of Service:     If discussed at [employer], dates discussed:    Additional Comments: 08/04/2017 Discussed in LOS - LTACH referral given by physician advisor however attending team declined.  Pt has been weaned off of all drips and will discharge home with [facility].   Maryclare Labrador, RN 08/04/2017, 3:44 PM

## 2017-08-04 NOTE — Progress Notes (Signed)
Came to see if pt wanted to walk. He has walked once today (2 laps). Felt well off milrinone. Reviewed HF and walking at home, answered questions. He declines walking again right now, will walk later. We will sign off. [id number] Yves Dill CES, ACSM 3:10 PM 08/04/2017

## 2017-08-05 ENCOUNTER — Other Ambulatory Visit (HOSPITAL_COMMUNITY): Payer: Self-pay | Admitting: *Deleted

## 2017-08-05 LAB — CBC
HCT: 38.9 % — ABNORMAL LOW (ref 39.0–52.0)
Hemoglobin: 12.9 g/dL — ABNORMAL LOW (ref 13.0–17.0)
MCH: 26.5 pg (ref 26.0–34.0)
MCHC: 33.2 g/dL (ref 30.0–36.0)
MCV: 80 fL (ref 78.0–100.0)
PLATELETS: 210 10*3/uL (ref 150–400)
RBC: 4.86 MIL/uL (ref 4.22–5.81)
RDW: 13.6 % (ref 11.5–15.5)
WBC: 8.2 10*3/uL (ref 4.0–10.5)

## 2017-08-05 LAB — BASIC METABOLIC PANEL
ANION GAP: 12 (ref 5–15)
BUN: 54 mg/dL — ABNORMAL HIGH (ref 6–20)
CALCIUM: 9.1 mg/dL (ref 8.9–10.3)
CO2: 29 mmol/L (ref 22–32)
Chloride: 96 mmol/L — ABNORMAL LOW (ref 101–111)
Creatinine, Ser: 2.02 mg/dL — ABNORMAL HIGH (ref 0.61–1.24)
GFR calc Af Amer: 34 mL/min — ABNORMAL LOW (ref >60)
GFR, EST NON AFRICAN AMERICAN: 29 mL/min — AB (ref >60)
Glucose, Bld: 118 mg/dL — ABNORMAL HIGH (ref 65–99)
Potassium: 3.4 mmol/L — ABNORMAL LOW (ref 3.5–5.1)
Sodium: 137 mmol/L (ref 135–145)

## 2017-08-05 LAB — COOXEMETRY PANEL
Carboxyhemoglobin: 1.2 % (ref 0.5–1.5)
Methemoglobin: 1.1 % (ref 0.0–1.5)
O2 SAT: 65.3 %
TOTAL HEMOGLOBIN: 13.2 g/dL (ref 12.0–16.0)

## 2017-08-05 LAB — PROTIME-INR
INR: 2.36
Prothrombin Time: 25.6 seconds — ABNORMAL HIGH (ref 11.4–15.2)

## 2017-08-05 MED ORDER — TORSEMIDE 20 MG PO TABS: 40.0000 mg | ORAL_TABLET | Freq: Every day | ORAL | 6 refills | Status: DC

## 2017-08-05 MED ORDER — DIGOXIN 62.5 MCG PO TABS: 0.0625 mg | ORAL_TABLET | Freq: Every day | ORAL | 6 refills | Status: DC

## 2017-08-05 MED ORDER — POTASSIUM CHLORIDE CRYS ER 20 MEQ PO TBCR: 40.0000 meq | EXTENDED_RELEASE_TABLET | Freq: Two times a day (BID) | ORAL | 6 refills | Status: DC

## 2017-08-05 MED ORDER — LOSARTAN POTASSIUM 25 MG PO TABS: 12.5000 mg | ORAL_TABLET | Freq: Every day | ORAL | 3 refills | Status: DC

## 2017-08-05 MED ORDER — SPIRONOLACTONE 25 MG PO TABS: 25.0000 mg | ORAL_TABLET | Freq: Every day | ORAL | 6 refills | Status: DC

## 2017-08-05 MED ORDER — AMIODARONE HCL 200 MG PO TABS
200.0000 mg | ORAL_TABLET | Freq: Two times a day (BID) | ORAL | 6 refills | Status: DC
Start: 2017-08-05 — End: 2017-08-12

## 2017-08-05 NOTE — Progress Notes (Signed)
Advanced Heart Failure Rounding Note  Primary Cardiologist: Dr. Acie Fredrickson  Subjective:    Doing well .Feels good. Milrinone off. Co-ox 65% Creatinine up slightly  Objective:   Weight Range: 207 lb 8 oz (94.1 kg) Body mass index is 32.5 kg/m.   Vital Signs:   Temp:  [97.5 F (36.4 C)-98.6 F (37 C)] 98.6 F (37 C) (01/23 0352) Pulse Rate:  [62-72] 62 (01/23 0352) Resp:  [21-29] 28 (01/23 0714) BP: (88-112)/(51-78) 88/64 (01/23 0714) SpO2:  [96 %-100 %] 96 % (01/23 0714) Weight:  [207 lb 8 oz (94.1 kg)] 207 lb 8 oz (94.1 kg) (01/23 0638) Last BM Date: 08/04/17  Weight change: Filed Weights   08/03/17 0428 08/04/17 0439 08/05/17 8299  Weight: 214 lb 8.1 oz (97.3 kg) 211 lb 13.8 oz (96.1 kg) 207 lb 8 oz (94.1 kg)   Intake/Output:   Intake/Output Summary (Last 24 hours) at 08/05/2017 0857 Last data filed at 08/05/2017 0700 Gross per 24 hour  Intake 1107.2 ml  Output 1750 ml  Net -642.8 ml    Physical Exam   General:  Sitting in chair Well appearing. No resp difficulty HEENT: normal Neck: supple. JVP 7-8 Carotids 2+ bilat; no bruits. No lymphadenopathy or thryomegaly appreciated. Cor: PMI laterallydisplaced. Regular rate & rhythm. No rubs, gallops or murmurs. Lungs: clear Abdomen: soft, nontender, nondistended. No hepatosplenomegaly. No bruits or masses. Good bowel sounds. Extremities: no cyanosis, clubbing, rash, edema Neuro: alert & orientedx3, cranial nerves grossly intact. moves all 4 extremities w/o difficulty. Affect pleasant     Telemetry   NSR 70s occasional PVCs personally reviewed  EKG    No new tracings.  Labs    CBC Recent Labs    08/05/17 0430  WBC 8.2  HGB 12.9*  HCT 38.9*  MCV 80.0  PLT 371   Basic Metabolic Panel Recent Labs    08/04/17 0340 08/05/17 0430  NA 133* 137  K 3.2* 3.4*  CL 92* 96*  CO2 27 29  GLUCOSE 154* 118*  BUN 48* 54*  CREATININE 1.93* 2.02*  CALCIUM 9.0 9.1   Liver Function Tests No results for  input(s): AST, ALT, ALKPHOS, BILITOT, PROT, ALBUMIN in the last 72 hours. No results for input(s): LIPASE, AMYLASE in the last 72 hours. Cardiac Enzymes No results for input(s): CKTOTAL, CKMB, CKMBINDEX, TROPONINI in the last 72 hours.  BNP: BNP (last 3 results) Recent Labs    07/25/17 1208  BNP 1,864.1*    ProBNP (last 3 results) No results for input(s): PROBNP in the last 8760 hours.   D-Dimer No results for input(s): DDIMER in the last 72 hours. Hemoglobin A1C No results for input(s): HGBA1C in the last 72 hours. Fasting Lipid Panel No results for input(s): CHOL, HDL, LDLCALC, TRIG, CHOLHDL, LDLDIRECT in the last 72 hours. Thyroid Function Tests No results for input(s): TSH, T4TOTAL, T3FREE, THYROIDAB in the last 72 hours.  Invalid input(s): FREET3  Other results:   Imaging    No results found.   Medications:     Scheduled Medications: . amiodarone  200 mg Oral BID  . cholecalciferol  2,000 Units Oral BID  . digoxin  0.0625 mg Oral Daily  . losartan  12.5 mg Oral QHS  . potassium chloride  40 mEq Oral BID  . sodium chloride flush  3 mL Intravenous Q12H  . spironolactone  25 mg Oral Daily  . torsemide  40 mg Oral BID  . warfarin  2.5 mg Oral Once per day on  Mon Wed Fri  . warfarin  5 mg Oral Once per day on Sun Tue Thu Sat  . Warfarin - Pharmacist Dosing Inpatient   Does not apply q1800    Infusions: . sodium chloride Stopped (07/29/17 0400)    PRN Medications: sodium chloride, acetaminophen, ondansetron (ZOFRAN) IV, polyethylene glycol, sodium chloride, sodium chloride flush, sodium chloride flush  Patient Profile   Michael Randolph is a 83 y.o. male with past medical history of nonischemic cardiomyopathy admitted with acute on chronic systolic congestive heart failure.  HF team consulted 07/28/17 with low output CHF  Assessment/Plan   1. Acute on chronic diastolic/systolic congestive heart failure -> NICM by cath 2016. - Echo 07/16/2017 LVEF 25%,  Grade 2 DD, Trivial AI, Mod MR, Sev LAE - Unclear etiology. Did have severe Flu episode 2015. ? PVC burden. Consider cMRI. -  CVP - Volume status and co-ox stable. Creatinine up slightly.  - Ok to go home today off milrinone. Cut torsemide back to 40 daily.  - F/u in clinic next week   2.PVCs, frequent (poymorphic) -Unclear chronicity, PTA vs after mirlinone. Seems to have preceded - Improved with weaning milrinone. Continue amio 200 mg twice a day. - Goal K >  4.0 and Mg 2.0.   3. AKI - Creatinine 1.1 -> 1.26 -> 1.50 -> 1.6 -> 1.71-> 1.55-> 1.67-> 1.86->1.93 -> 2.02 _   4.Moderate MR  - By echo this admit. No change.   5.Hypokalemia - K 3.4  - Supplement K.    6.Hyponatremia- - Resolved.  - Sodium 137.   7.History of DVT- - On long term coumadin. _ INR 2.36 .  No bleeding.  Dosing per pharm. Discussed dosing with PharmD personally.  8. Left carotid bruit - Carotid dopplers 03/2017 1-39% bilateral ICA stenosis. No change.   Length of Stay: [name], NP  08/05/2017, 8:57 AM  Advanced Heart Failure Team Pager [phone] (M-F; 7a - 4p)  Please contact [facility] for night-coverage after hours (4p -7a ) and weekends on [email or web address]  Stable off milrinone. PVCs decreased. Creatinine up slightly. Can go home today. Follow closely in HF Clinic.  Glori Bickers, MD  10:06 AM

## 2017-08-05 NOTE — Discharge Summary (Signed)
Advanced Heart Failure Team  Discharge Summary   Patient ID: Michael Randolph MRN: [id number], DOB/AGE: 1935-04-23 83 y.o. Admit date: 07/25/2017 D/C date:     08/05/2017   Primary Discharge Diagnoses:  1.Acute on chronic diastolic/systolic congestive heart failure-> NICMby cath 2016. - Echo 07/16/2017 LVEF 25%, Grade 2 DD, Trivial AI, Mod MR, Sev LAE 2.PVCs, frequent (poymorphic) 3. AKI - Creatinine peaked 2.02 on the day of discharge 4.Moderate MR  -By echo this admit.No change 5.Hypokalemia 6.Hyponatremia - Sodium low 127 but  137 on the day discharge.  7.History of DVT-On long term coumadin. INR managed by Dr Virgina Jock 8. Left carotid bruit   Hospital Course:  [name] Pegramis a 83 y.o.malewith past medical history of nonischemic cardiomyopathy, PVCs, DVT, MR, and left carotid bruit admitted with acute on chronic systolic congestive heart failure.   He had stopped all HF medications September 2018 and started feeling poorly in December 2018. Admitted with marked volume overload and was diuresed IV lasix but had poor response. ECHO completed and showed EF 25% , RV mildly dilated, and grade II DD. Started on milrinone to support RV and diurese. Diuresed with IV lasix + metolazone and later transitioned to torsemide 40 mg daily. Renal function was followed closely with diuretics adjusted. He was not placed on ARB due to elevated creatinine. He was not placed on bb due to low output.   He had PVCs prior to admit but with increasing frequency after starting milrinone so IV amio was started. Once milrinone was stopped he transitioned to amiodarone 200 mg twice daily. He was on coumadin prior to admit for history of DVT. INR will continue to be managed in the community by Dr Virgina Jock.   Plan to follow closely in the HF clinic and will check BMET and EKG next week. Plan to repeat ECHO in the 3 months after HF meds optimized. Discharge weight 207 pounds.    Discharge Weight :  207 pounds   Discharge Vitals: Blood pressure (!) 88/64, pulse 62, temperature 98.6 F (37 C), temperature source Oral, resp. rate (!) 28, height 5\' 7"  (1.702 m), weight 207 lb 8 oz (94.1 kg), SpO2 96 %.  Labs: Lab Results  Component Value Date   WBC 8.2 08/05/2017   HGB 12.9 (L) 08/05/2017   HCT 38.9 (L) 08/05/2017   MCV 80.0 08/05/2017   PLT 210 08/05/2017    Recent Labs  Lab 08/05/17 0430  NA 137  K 3.4*  CL 96*  CO2 29  BUN 54*  CREATININE 2.02*  CALCIUM 9.1  GLUCOSE 118*   Lab Results  Component Value Date   CHOL 214 (H) 03/04/2017   HDL 28 (L) 03/04/2017   LDLCALC 111 (H) 03/04/2017   TRIG 374 (H) 03/04/2017   BNP (last 3 results) Recent Labs    07/25/17 1208  BNP 1,864.1*    ProBNP (last 3 results) No results for input(s): PROBNP in the last 8760 hours.   Diagnostic Studies/Procedures   No results found.  Discharge Medications   Allergies as of 08/05/2017      Reactions   Actos [pioglitazone] Other (See Comments)   Slow HR and pain in face    Anesthetics, Halogenated    Slept too long   Crestor [rosuvastatin Calcium]    Pain    Sulfa Antibiotics Other (See Comments)   hallucinations   Dye Fdc Red [red Dye] Rash   # 40      Medication List    STOP  taking these medications   furosemide 20 MG tablet Commonly known as:  LASIX   metoprolol succinate 25 MG 24 hr tablet Commonly known as:  TOPROL-XL     TAKE these medications   amiodarone 200 MG tablet Commonly known as:  PACERONE Take 1 tablet (200 mg total) by mouth 2 (two) times daily.   aspirin 81 MG tablet Take 81 mg by mouth at bedtime.   Cholecalciferol 2000 units Caps Take 1 capsule by mouth 2 (two) times daily.   Digoxin 62.5 MCG Tabs Take 0.0625 mg by mouth daily. Start taking on:  08/06/2017   fenofibrate 145 MG tablet Commonly known as:  TRICOR Take 1 tablet (145 mg total) by mouth daily.   GARLIC PO Take 1 tablet by mouth daily.   losartan 25 MG tablet Commonly  known as:  COZAAR Take 0.5 tablets (12.5 mg total) by mouth daily. What changed:  how much to take   MAGNESIUM PO Take 1 tablet by mouth 2 (two) times daily.   MSM 1000 MG Caps Take 1,000 mg by mouth daily.   potassium chloride SA 20 MEQ tablet Commonly known as:  K-DUR,KLOR-CON Take 2 tablets (40 mEq total) by mouth 2 (two) times daily.   sodium chloride 0.65 % Soln nasal spray Commonly known as:  OCEAN Place 1 spray into both nostrils as needed for congestion.   spironolactone 25 MG tablet Commonly known as:  ALDACTONE Take 1 tablet (25 mg total) by mouth daily. Start taking on:  08/06/2017   torsemide 20 MG tablet Commonly known as:  DEMADEX Take 2 tablets (40 mg total) by mouth daily.   warfarin 5 MG tablet Commonly known as:  COUMADIN Take 2.5-5 mg by mouth See admin instructions. Takes 2.5mg  Mon, Wed, Fri; Take 5mg  Tues, Thurs, Sat, Sun       Disposition   The patient will be discharged in stable condition to home. Discharge Instructions    (HEART FAILURE PATIENTS) Call MD:  Anytime you have any of the following symptoms: 1) 3 pound weight gain in 24 hours or 5 pounds in 1 week 2) shortness of breath, with or without a dry hacking cough 3) swelling in the hands, feet or stomach 4) if you have to sleep on extra pillows at night in order to breathe.   Complete by:  As directed    Diet - low sodium heart healthy   Complete by:  As directed    Heart Failure patients record your daily weight using the same scale at the same time of day   Complete by:  As directed    Increase activity slowly   Complete by:  As directed      Follow-up Information    [facility] Follow up on 08/12/2017.   Specialty:  Cardiology Why:  at  2:00 Garage Code 9000 Contact information: [address] [id number] [place] [phone]            Duration of Discharge Encounter: Greater than 35 minutes    Jeanmarie Hubert  NP-C  08/05/2017, 9:43 AM   Patient seen and examined with Darrick Grinder, NP. We discussed all aspects of the encounter. I agree with the assessment and plan as stated above.   He has advanced HF but is improved. Now weaned off milrinone. PVC burden improved with amio. Will d/c home. F/u closely in HF Clinic. Stressed need to be complaint with meds.   Quillian Quince  Bensimhon, MD  5:57 PM

## 2017-08-05 NOTE — Progress Notes (Signed)
Pt discharged per MD order, all discharge instructions reviewed and all questions answered. Wife present for discharge instructions.

## 2017-08-05 NOTE — Progress Notes (Signed)
[place] for warfarin Indication: DVT  Allergies  Allergen Reactions  . Actos [Pioglitazone] Other (See Comments)    Slow HR and pain in face   . Anesthetics, Halogenated     Slept too long  . Crestor [Rosuvastatin Calcium]     Pain   . Sulfa Antibiotics Other (See Comments)    hallucinations  . Dye Fdc Red [Red Dye] Rash    # 40    Patient Measurements: Height: 5\' 7"  (170.2 cm) Weight: 207 lb 8 oz (94.1 kg) IBW/kg (Calculated) : 66.1  Vital Signs: Temp: 98.6 F (37 C) (01/23 0352) Temp Source: Oral (01/23 0352) BP: 88/64 (01/23 0714) Pulse Rate: 62 (01/23 0352)  Labs: Recent Labs    08/03/17 0430 08/03/17 1459 08/04/17 0340 08/05/17 0430  HGB  --   --   --  12.9*  HCT  --   --   --  38.9*  PLT  --   --   --  210  LABPROT 24.4*  --  24.6* 25.6*  INR 2.21  --  2.24 2.36  CREATININE 1.86* 1.99* 1.93* 2.02*    Estimated Creatinine Clearance: 31.9 mL/min (A) (by C-G formula based on SCr of 2.02 mg/dL (H)).   Assessment: 36 yom being evaluated for acute on chronic CHF, on warfarin PTA for chronic DVTs.  Home regimen is 5 mg daily except 2.5 mg on MWF. Following with Dr Oretha Ellis at [facility] Last dose was 2.5 mg on 1/11 PTA.  INR 2.36 no bleeding noted CBC stable.     Goal of Therapy:  INR 2-3 Monitor platelets by anticoagulation protocol: Yes   Plan:  Restart home dose warfarin 2.5mg  MWF / 5mg  TTSS Monitor daily INR, CBC, s/sx bleeding  Bonnita Nasuti Pharm.D. CPP, BCPS Clinical Pharmacist [phone] 08/05/2017 8:58 AM

## 2017-08-05 NOTE — Care Management Note (Signed)
Case Management Note  Patient Details  Name: BERND CROM MRN: [id number] Date of Birth: 15-Jan-1935  Subjective/Objective:       Pt admitted with SOB and HF - pt is now on milrinone, amiodarone and IV lasix             Action/Plan:    PTA independent from home with wife.  Pt weighs daily and adheres to low salt diet.  Pt has PCP and denied barriers to obtain/pay for medications   Expected Discharge Date:  08/05/17               Expected Discharge Plan:  Home/Self Care  In-House Referral:     Discharge planning Services  CM Consult  Post Acute Care Choice:    Choice offered to:     DME Arranged:    DME Agency:     HH Arranged:    HH Agency:     Status of Service:     If discussed at [employer], dates discussed:    Additional Comments: 08/05/2017  CM recommended RN for disease management however per HF team pt declined. Pt will discharge home today.  NO CM interventions neccessary  08/04/17 Discussed in LOS - LTACH referral given by physician advisor however attending team declined.  Pt has been weaned off of all drips and will discharge home with [facility].   Maryclare Labrador, RN 08/05/2017, 10:12 AM

## 2017-08-09 ENCOUNTER — Emergency Department (HOSPITAL_COMMUNITY): Payer: Medicare Other

## 2017-08-09 ENCOUNTER — Observation Stay (HOSPITAL_COMMUNITY)
Admission: EM | Admit: 2017-08-09 | Discharge: 2017-08-10 | Disposition: A | Discharge status: Home / Self Care | Payer: Medicare Other | Attending: Family Medicine | Admitting: Family Medicine

## 2017-08-09 ENCOUNTER — Encounter (HOSPITAL_COMMUNITY): Payer: Self-pay

## 2017-08-09 ENCOUNTER — Other Ambulatory Visit: Payer: Self-pay

## 2017-08-09 DIAGNOSIS — Z79899 Other long term (current) drug therapy: Secondary | ICD-10-CM | POA: Insufficient documentation

## 2017-08-09 DIAGNOSIS — Z87442 Personal history of urinary calculi: Secondary | ICD-10-CM | POA: Insufficient documentation

## 2017-08-09 DIAGNOSIS — I82503 Chronic embolism and thrombosis of unspecified deep veins of lower extremity, bilateral: Secondary | ICD-10-CM

## 2017-08-09 DIAGNOSIS — R4182 Altered mental status, unspecified: Secondary | ICD-10-CM | POA: Diagnosis not present

## 2017-08-09 DIAGNOSIS — I252 Old myocardial infarction: Secondary | ICD-10-CM | POA: Insufficient documentation

## 2017-08-09 DIAGNOSIS — Z882 Allergy status to sulfonamides status: Secondary | ICD-10-CM | POA: Insufficient documentation

## 2017-08-09 DIAGNOSIS — Z818 Family history of other mental and behavioral disorders: Secondary | ICD-10-CM | POA: Diagnosis not present

## 2017-08-09 DIAGNOSIS — Z823 Family history of stroke: Secondary | ICD-10-CM | POA: Insufficient documentation

## 2017-08-09 DIAGNOSIS — I517 Cardiomegaly: Secondary | ICD-10-CM | POA: Diagnosis not present

## 2017-08-09 DIAGNOSIS — Z7982 Long term (current) use of aspirin: Secondary | ICD-10-CM | POA: Insufficient documentation

## 2017-08-09 DIAGNOSIS — Z8379 Family history of other diseases of the digestive system: Secondary | ICD-10-CM | POA: Insufficient documentation

## 2017-08-09 DIAGNOSIS — Z888 Allergy status to other drugs, medicaments and biological substances status: Secondary | ICD-10-CM | POA: Diagnosis not present

## 2017-08-09 DIAGNOSIS — R410 Disorientation, unspecified: Secondary | ICD-10-CM | POA: Diagnosis not present

## 2017-08-09 DIAGNOSIS — I472 Ventricular tachycardia: Secondary | ICD-10-CM | POA: Diagnosis not present

## 2017-08-09 DIAGNOSIS — N390 Urinary tract infection, site not specified: Secondary | ICD-10-CM | POA: Diagnosis present

## 2017-08-09 DIAGNOSIS — I5042 Chronic combined systolic (congestive) and diastolic (congestive) heart failure: Secondary | ICD-10-CM | POA: Diagnosis present

## 2017-08-09 DIAGNOSIS — N183 Chronic kidney disease, stage 3 (moderate): Secondary | ICD-10-CM | POA: Insufficient documentation

## 2017-08-09 DIAGNOSIS — I82409 Acute embolism and thrombosis of unspecified deep veins of unspecified lower extremity: Secondary | ICD-10-CM | POA: Diagnosis not present

## 2017-08-09 DIAGNOSIS — E1122 Type 2 diabetes mellitus with diabetic chronic kidney disease: Secondary | ICD-10-CM | POA: Insufficient documentation

## 2017-08-09 DIAGNOSIS — I700 Atherosclerosis of aorta: Secondary | ICD-10-CM | POA: Insufficient documentation

## 2017-08-09 DIAGNOSIS — Z86718 Personal history of other venous thrombosis and embolism: Secondary | ICD-10-CM | POA: Diagnosis not present

## 2017-08-09 DIAGNOSIS — Z884 Allergy status to anesthetic agent status: Secondary | ICD-10-CM | POA: Diagnosis not present

## 2017-08-09 DIAGNOSIS — Z8673 Personal history of transient ischemic attack (TIA), and cerebral infarction without residual deficits: Secondary | ICD-10-CM | POA: Insufficient documentation

## 2017-08-09 DIAGNOSIS — I428 Other cardiomyopathies: Secondary | ICD-10-CM

## 2017-08-09 DIAGNOSIS — Z7901 Long term (current) use of anticoagulants: Secondary | ICD-10-CM | POA: Insufficient documentation

## 2017-08-09 DIAGNOSIS — G4733 Obstructive sleep apnea (adult) (pediatric): Secondary | ICD-10-CM | POA: Insufficient documentation

## 2017-08-09 DIAGNOSIS — Z8249 Family history of ischemic heart disease and other diseases of the circulatory system: Secondary | ICD-10-CM | POA: Insufficient documentation

## 2017-08-09 DIAGNOSIS — R0902 Hypoxemia: Secondary | ICD-10-CM | POA: Diagnosis not present

## 2017-08-09 LAB — COMPREHENSIVE METABOLIC PANEL
ALBUMIN: 3.7 g/dL (ref 3.5–5.0)
ALT: 19 U/L (ref 17–63)
AST: 26 U/L (ref 15–41)
Alkaline Phosphatase: 64 U/L (ref 38–126)
Anion gap: 14 (ref 5–15)
BUN: 49 mg/dL — AB (ref 6–20)
CHLORIDE: 100 mmol/L — AB (ref 101–111)
CO2: 25 mmol/L (ref 22–32)
CREATININE: 1.91 mg/dL — AB (ref 0.61–1.24)
Calcium: 10.3 mg/dL (ref 8.9–10.3)
GFR calc Af Amer: 37 mL/min — ABNORMAL LOW (ref >60)
GFR, EST NON AFRICAN AMERICAN: 32 mL/min — AB (ref >60)
GLUCOSE: 133 mg/dL — AB (ref 65–99)
POTASSIUM: 5.1 mmol/L (ref 3.5–5.1)
SODIUM: 139 mmol/L (ref 135–145)
Total Bilirubin: 1 mg/dL (ref 0.3–1.2)
Total Protein: 7.3 g/dL (ref 6.5–8.1)

## 2017-08-09 LAB — URINALYSIS, ROUTINE W REFLEX MICROSCOPIC
BILIRUBIN URINE: NEGATIVE
Glucose, UA: NEGATIVE mg/dL
Ketones, ur: NEGATIVE mg/dL
NITRITE: NEGATIVE
PROTEIN: NEGATIVE mg/dL
Specific Gravity, Urine: 1.009 (ref 1.005–1.030)
Squamous Epithelial / LPF: NONE SEEN
pH: 8 (ref 5.0–8.0)

## 2017-08-09 LAB — CBC WITH DIFFERENTIAL/PLATELET
Basophils Absolute: 0.1 10*3/uL (ref 0.0–0.1)
Basophils Relative: 1 %
EOS ABS: 0.2 10*3/uL (ref 0.0–0.7)
EOS PCT: 2 %
HCT: 44.5 % (ref 39.0–52.0)
Hemoglobin: 14.8 g/dL (ref 13.0–17.0)
LYMPHS ABS: 2.2 10*3/uL (ref 0.7–4.0)
Lymphocytes Relative: 21 %
MCH: 27.3 pg (ref 26.0–34.0)
MCHC: 33.3 g/dL (ref 30.0–36.0)
MCV: 82.1 fL (ref 78.0–100.0)
MONOS PCT: 9 %
Monocytes Absolute: 0.9 10*3/uL (ref 0.1–1.0)
Neutro Abs: 6.9 10*3/uL (ref 1.7–7.7)
Neutrophils Relative %: 67 %
PLATELETS: 258 10*3/uL (ref 150–400)
RBC: 5.42 MIL/uL (ref 4.22–5.81)
RDW: 13.4 % (ref 11.5–15.5)
WBC: 10.2 10*3/uL (ref 4.0–10.5)

## 2017-08-09 LAB — I-STAT CG4 LACTIC ACID, ED: Lactic Acid, Venous: 3.01 mmol/L (ref 0.5–1.9)

## 2017-08-09 LAB — CBG MONITORING, ED: Glucose-Capillary: 115 mg/dL — ABNORMAL HIGH (ref 65–99)

## 2017-08-09 LAB — T4, FREE: Free T4: 0.98 ng/dL (ref 0.61–1.12)

## 2017-08-09 LAB — BRAIN NATRIURETIC PEPTIDE: B NATRIURETIC PEPTIDE 5: 1558 pg/mL — AB (ref 0.0–100.0)

## 2017-08-09 LAB — PROTIME-INR
INR: 3.38
Prothrombin Time: 33.9 seconds — ABNORMAL HIGH (ref 11.4–15.2)

## 2017-08-09 LAB — AMMONIA: AMMONIA: 23 umol/L (ref 9–35)

## 2017-08-09 LAB — TSH: TSH: 6.234 u[IU]/mL — ABNORMAL HIGH (ref 0.350–4.500)

## 2017-08-09 MED ORDER — AMIODARONE HCL 200 MG PO TABS
200.0000 mg | ORAL_TABLET | Freq: Two times a day (BID) | ORAL | Status: DC
  Administered 2017-08-09 – 2017-08-10 (×2): 200 mg via ORAL
  Filled 2017-08-09 (×2): qty 1

## 2017-08-09 MED ORDER — ASPIRIN EC 81 MG PO TBEC: 81.0000 mg | DELAYED_RELEASE_TABLET | Freq: Every day | ORAL | Status: DC

## 2017-08-09 MED ORDER — CEFTRIAXONE SODIUM 1 G IJ SOLR
1.0000 g | Freq: Once | INTRAMUSCULAR | Status: AC
  Administered 2017-08-09: 1 g via INTRAVENOUS
  Filled 2017-08-09: qty 10

## 2017-08-09 MED ORDER — ACETAMINOPHEN 325 MG PO TABS: 650.0000 mg | ORAL_TABLET | Freq: Four times a day (QID) | ORAL | Status: DC | PRN

## 2017-08-09 MED ORDER — ONDANSETRON HCL 4 MG/2ML IJ SOLN: 4.0000 mg | Freq: Four times a day (QID) | INTRAMUSCULAR | Status: DC | PRN

## 2017-08-09 MED ORDER — SALINE SPRAY 0.65 % NA SOLN
1.0000 | NASAL | Status: DC | PRN
  Filled 2017-08-09: qty 44

## 2017-08-09 MED ORDER — LOSARTAN POTASSIUM 25 MG PO TABS
12.5000 mg | ORAL_TABLET | Freq: Every day | ORAL | Status: DC
  Filled 2017-08-09: qty 0.5

## 2017-08-09 MED ORDER — DIGOXIN 125 MCG PO TABS
62.5000 ug | ORAL_TABLET | Freq: Every day | ORAL | Status: DC
  Administered 2017-08-10: 62.5 ug via ORAL
  Filled 2017-08-09: qty 1

## 2017-08-09 MED ORDER — FUROSEMIDE 10 MG/ML IJ SOLN
80.0000 mg | INTRAMUSCULAR | Status: AC
  Administered 2017-08-09: 80 mg via INTRAVENOUS
  Filled 2017-08-09: qty 8

## 2017-08-09 MED ORDER — CEFTRIAXONE SODIUM 1 G IJ SOLR: 1.0000 g | INTRAMUSCULAR | Status: DC

## 2017-08-09 MED ORDER — ACETAMINOPHEN 650 MG RE SUPP: 650.0000 mg | Freq: Four times a day (QID) | RECTAL | Status: DC | PRN

## 2017-08-09 MED ORDER — ONDANSETRON HCL 4 MG PO TABS: 4.0000 mg | ORAL_TABLET | Freq: Four times a day (QID) | ORAL | Status: DC | PRN

## 2017-08-09 MED ORDER — TORSEMIDE 20 MG PO TABS
40.0000 mg | ORAL_TABLET | Freq: Every day | ORAL | Status: DC
  Administered 2017-08-10: 40 mg via ORAL
  Filled 2017-08-09: qty 2

## 2017-08-09 NOTE — ED Notes (Signed)
Critical lab-lactic provided to Dr. Rex Kras. Xray called for transportation.

## 2017-08-09 NOTE — ED Notes (Signed)
Patient transported to X-ray 

## 2017-08-09 NOTE — ED Notes (Signed)
Patient EKG reading trigemeny. Patient alert. No complaints of pain or distress. EDP providers aware. Hopitalist, Grunz MD paged.

## 2017-08-09 NOTE — ED Notes (Signed)
Patient transported to Ultrasound 

## 2017-08-09 NOTE — ED Notes (Signed)
Tried IV start x 1 and unsuccessful. Wife asked for IV team consult due to patient recently being in hospital and having PICC line

## 2017-08-09 NOTE — ED Notes (Signed)
IV at bedside. 

## 2017-08-09 NOTE — ED Notes (Signed)
Phlebotomy contacted for assistance with blood draw and labs.

## 2017-08-09 NOTE — ED Notes (Signed)
IV team unable to establish access. Will attempt when patient returns from xray.

## 2017-08-09 NOTE — ED Notes (Signed)
Per IV team- unable to get IV access, will place order for another consult.

## 2017-08-09 NOTE — ED Provider Notes (Signed)
[facility] EMERGENCY DEPARTMENT Provider Note   CSN: [id number] Arrival date & time: 08/09/17  1421     History   Chief Complaint Chief Complaint  Patient presents with  . Altered Mental Status    HPI Michael Randolph is a 83 y.o. male.  83 year old male with past medical history including CHF, type 2 diabetes mellitus, DVT, CVA, OSA who presents with altered mental status.  The patient was discharged from the hospital a few days ago after a 2-week stay for congestive heart failure.  Wife states that he was doing okay at home until this morning.  After he had gotten up and eaten breakfast, she began noticing that he seemed confused.  She states that he kept trying to do the same things over and over. He denies any complains of pain, SOB, urinary symptoms, vomiting, diarrhea, or extremity weakness.  LEVEL 5 CAVEAT DUE TO AMS   The history is provided by the spouse.  Altered Mental Status      Past Medical History:  Diagnosis Date  . Congestive heart failure (CHF) ([place])   . Diabetes mellitus without complication (HCC)    no medications  . Diverticulosis   . DVT (deep venous thrombosis) ([place])   . Heart disease   . Hyperlipemia   . Kidney stone   . NICM (nonischemic cardiomyopathy) ([place])   . OSA (obstructive sleep apnea)   . Stroke ([place])   . TIA (transient ischemic attack) 2008   was seen on CT   . Toe infection     Patient Active Problem List   Diagnosis Date Noted  . Frequent PVCs   . CHF (congestive heart failure) ([place]) 07/25/2017  . Mixed hyperlipidemia 03/11/2017  . NSVT (nonsustained ventricular tachycardia) ([place]) 05/19/2016  . Acute on chronic systolic (congestive) heart failure ([place]) 05/19/2016  . Sepsis ([place]) 05/16/2016  . CAP (community acquired pneumonia) 05/16/2016  . AKI (acute kidney injury) ([place]) 05/16/2016  . Elevated troponin   . NSTEMI (non-ST elevated myocardial infarction) ([place])   . Diabetes mellitus without complication  ([place]) 56/31/4970  . DVT (deep venous thrombosis) ([place])   . TIA (transient ischemic attack)     Past Surgical History:  Procedure Laterality Date  . CARDIAC CATHETERIZATION N/A 12/01/2014   Procedure: Right/Left Heart Cath and Coronary Angiography;  Surgeon: Jettie Booze, MD;  Location: [facility];  Service: Cardiovascular;  Laterality: N/A;  . COLONOSCOPY  2013  . ROTATOR CUFF REPAIR Right        Home Medications    Prior to Admission medications   Medication Sig Start Date End Date Taking? Authorizing Provider  amiodarone (PACERONE) 200 MG tablet Take 1 tablet (200 mg total) by mouth 2 (two) times daily. 08/05/17   Clegg, Amy D, NP  aspirin 81 MG tablet Take 81 mg by mouth at bedtime.     [provider]  Cholecalciferol 2000 UNITS CAPS Take 1 capsule by mouth 2 (two) times daily.     [provider]  digoxin 62.5 MCG TABS Take 0.0625 mg by mouth daily. 08/06/17   Clegg, Amy D, NP  fenofibrate (TRICOR) 145 MG tablet Take 1 tablet (145 mg total) by mouth daily. Patient not taking: Reported on 07/25/2017 03/11/17   Nahser, Wonda Cheng, MD  GARLIC PO Take 1 tablet by mouth daily.    [provider]  losartan (COZAAR) 25 MG tablet Take 0.5 tablets (12.5 mg total) by mouth daily. 08/05/17   Clegg, Amy  D, NP  MAGNESIUM PO Take 1 tablet by mouth 2 (two) times daily.     [provider]  Methylsulfonylmethane (MSM) 1000 MG CAPS Take 1,000 mg by mouth daily.     [provider]  potassium chloride SA (K-DUR,KLOR-CON) 20 MEQ tablet Take 2 tablets (40 mEq total) by mouth 2 (two) times daily. 08/05/17   Clegg, Amy D, NP  sodium chloride (OCEAN) 0.65 % SOLN nasal spray Place 1 spray into both nostrils as needed for congestion.    [provider]  spironolactone (ALDACTONE) 25 MG tablet Take 1 tablet (25 mg total) by mouth daily. 08/06/17   Clegg, Amy D, NP  torsemide (DEMADEX) 20 MG tablet Take 2 tablets (40 mg total) by mouth daily.  08/05/17   Clegg, Amy D, NP  warfarin (COUMADIN) 5 MG tablet Take 2.5-5 mg by mouth See admin instructions. Takes 2.5mg  Mon, Wed, Fri; Take 5mg  Tues, Thurs, Sat, Sun    [provider]    Family History Family History  Problem Relation Age of Onset  . Stroke Mother   . Hypertension Mother   . Heart attack Father   . Deep vein thrombosis Father   . Crohn's disease Brother   . Schizophrenia Sister     Social History Social History   Tobacco Use  . Smoking status: Never Smoker  . Smokeless tobacco: Never Used  Substance Use Topics  . Alcohol use: No    Alcohol/week: 0.0 oz  . Drug use: No     Allergies   Actos [pioglitazone]; Anesthetics, halogenated; Crestor [rosuvastatin calcium]; Sulfa antibiotics; and Dye fdc red [red dye]   Review of Systems Review of Systems  Unable to perform ROS: Mental status change      Physical Exam Updated Vital Signs BP 137/79   Pulse 66   Temp 98.7 F (37.1 C) (Rectal)   Resp (!) 23   SpO2 93%   Physical Exam  Constitutional: He is oriented to person, place, and time. He appears well-developed and well-nourished. No distress.  HENT:  Head: Normocephalic and atraumatic.  Moist mucous membranes  Eyes: Conjunctivae are normal. Pupils are equal, round, and reactive to light.  Neck: Neck supple.  Cardiovascular: Normal rate and regular rhythm.  Murmur heard. Pulmonary/Chest: Breath sounds normal.  Very mild tachypnea  Abdominal: Soft. Bowel sounds are normal. He exhibits no distension. There is no tenderness.  Musculoskeletal: He exhibits no edema.  Neurological: He is alert and oriented to person, place, and time.  Fluent speech, no facial asymmetry, 5/5 strength and normal sensation x all 4 ext  Skin: Skin is warm and dry.  Psychiatric: He has a normal mood and affect. Judgment normal.  Nursing note and vitals reviewed.    ED Treatments / Results  Labs (all labs ordered are listed, but only abnormal results are  displayed) Labs Reviewed  COMPREHENSIVE METABOLIC PANEL - Abnormal; Notable for the following components:      Result Value   Chloride 100 (*)    Glucose, Bld 133 (*)    BUN 49 (*)    Creatinine, Ser 1.91 (*)    GFR calc non Af Amer 32 (*)    GFR calc Af Amer 37 (*)    All other components within normal limits  URINALYSIS, ROUTINE W REFLEX MICROSCOPIC - Abnormal; Notable for the following components:   Hgb urine dipstick SMALL (*)    Leukocytes, UA LARGE (*)    Bacteria, UA FEW (*)    All  other components within normal limits  BRAIN NATRIURETIC PEPTIDE - Abnormal; Notable for the following components:   B Natriuretic Peptide 1,558.0 (*)    All other components within normal limits  TSH - Abnormal; Notable for the following components:   TSH 6.234 (*)    All other components within normal limits  PROTIME-INR - Abnormal; Notable for the following components:   Prothrombin Time 33.9 (*)    All other components within normal limits  I-STAT CG4 LACTIC ACID, ED - Abnormal; Notable for the following components:   Lactic Acid, Venous 3.01 (*)    All other components within normal limits  CBG MONITORING, ED - Abnormal; Notable for the following components:   Glucose-Capillary 115 (*)    All other components within normal limits  CULTURE, BLOOD (ROUTINE X 2)  CULTURE, BLOOD (ROUTINE X 2)  URINE CULTURE  CBC WITH DIFFERENTIAL/PLATELET  AMMONIA  T4, FREE  I-STAT CG4 LACTIC ACID, ED    EKG  EKG Interpretation  Date/Time:  Sunday August 09 2017 14:41:30 EST Ventricular Rate:  74 PR Interval:    QRS Duration: 116 QT Interval:  414 QTC Calculation: 460 R Axis:   -12 Text Interpretation:  Sinus rhythm Prolonged PR interval Incomplete left bundle branch block frequent PVCs on previous tracing have resolved Confirmed by Theotis Burrow [phone]) on 08/09/2017 3:21:19 PM       Radiology Dg Chest 2 View  Result Date: 08/09/2017 CLINICAL DATA:  Altered mentation.  Patient repeat  things. EXAM: CHEST  2 VIEW COMPARISON:  07/25/2017 FINDINGS: Cardiomegaly is stable with minimal aortic atherosclerosis. Clear lungs without active pulmonary disease. No acute osseous abnormality. Degenerative changes are seen dorsal spine as before. IMPRESSION: Stable cardiomegaly without active pulmonary disease. Aortic atherosclerosis without aneurysm. Electronically Signed   By: Ashley Royalty M.D.   On: 08/09/2017 16:41   Ct Head Wo Contrast  Result Date: 08/09/2017 CLINICAL DATA:  Altered mental status today. EXAM: CT HEAD WITHOUT CONTRAST TECHNIQUE: Contiguous axial images were obtained from the base of the skull through the vertex without intravenous contrast. COMPARISON:  Brain MRI 01/25/2017. FINDINGS: Brain: There is cortical atrophy. Remote lacunar infarctions in the left caudate and right cerebellum are noted. No evidence of acute abnormality including hemorrhage, infarct, mass lesion, mass effect, midline shift or abnormal extra-axial fluid collection. No hydrocephalus or pneumocephalus. Vascular: Atherosclerosis noted. Skull: Intact. Sinuses/Orbits: Negative. Other: None. IMPRESSION: No acute abnormality. Atrophy and chronic microvascular ischemic change. Atherosclerosis. Electronically Signed   By: Inge Rise M.D.   On: 08/09/2017 16:26    Procedures Procedures (including critical care time)  Medications Ordered in ED Medications  cefTRIAXone (ROCEPHIN) 1 g in dextrose 5 % 50 mL IVPB (not administered)  furosemide (LASIX) injection 80 mg (not administered)     Initial Impression / Assessment and Plan / ED Course  I have reviewed the triage vital signs and the nursing notes.  Pertinent labs & imaging results that were available during my care of the patient were reviewed by me and considered in my medical decision making (see chart for details).     PT w/ AMS today, recent discharge from the hospital for CHF exacerbation.  His vital signs were normal on arrival, he was  oriented to person place and time, denied any complaints.  He had no focal neurologic deficits aside from report of confusion at home.  Lab work shows creatinine near baseline at 1.91, normal CBC, lactate 3.01, BNP 1558, TSH 6.2 with normal free T4, INR  supratherapeutic at 3.38.  UA is consistent with infection.  Urine cultures and blood cultures were sent based on lactate and altered mental status.  It appears that the patient's lactate is often elevated and his rectal temp as well as remainder of vital signs were normal here therefore I doubt sepsis.  His BNP is also similar to previous values.  I have ordered a dose of ceftriaxone as well as Lasix.  His chest x-ray is reassuring against pulmonary edema, although his sats have been 92-96% on RA and he is mildly tachypneic, potentially suggestive of mild volume overload.  Because of altered mental status associated with UTI, discussed admission with [facility] hospitalist, Dr. Bonner Puna, appreciate assistance. PT admitted for further care.  Final Clinical Impressions(s) / ED Diagnoses   Final diagnoses:  Altered mental status, unspecified altered mental status type  Urinary tract infection without hematuria, site unspecified    ED Discharge Orders    None       Nelva Hauk, Wenda Overland, MD 08/09/17 1710

## 2017-08-09 NOTE — ED Notes (Signed)
ED Provider at bedside. Little

## 2017-08-09 NOTE — ED Notes (Signed)
Grunz at bedside

## 2017-08-09 NOTE — ED Notes (Signed)
Patient up to bathroom. Minimal assistance.

## 2017-08-09 NOTE — ED Notes (Signed)
I stat lactic acid reported to Derald Macleod RN

## 2017-08-09 NOTE — ED Notes (Signed)
Received return call from Bonner Puna, MD. Notified of telemetry monitoring. Patient currently normal sinus. No complaints of chest.  Informed to continue monitoring.

## 2017-08-09 NOTE — H&P (Signed)
History and Physical   Michael Randolph XKG:[id number] DOB: 1935/02/02 DOA: 08/09/2017  Referring MD/NP/PA: Rex Kras, MD, EDP PCP: Shon Baton, MD Outpatient Specialists: [facility]  Patient coming from: Home  Chief Complaint: AMS  HPI: Michael Randolph is a 83 y.o. male with a history of chronic CHF, recently discharged from 2-week admission for exacerbation, T2DM, CVA, OSA who was brought by EMS due to an episode of confusion this morning. He had been taking medications regularly, documenting vitals, daily weights, medication administration, down to 202lbs from 207lbs at discharge on 1/23. This morning around 10:30am he became confused, repeatedly adjusting a mechanical chair repeating that he needed to document his blood pressure despite having just completed that task. He grew aggravated by his wife and son's attempts at redirection. His wife denies ever hearing slurred speech or seeing focal weakness, facial droop. When he continued like this for 2 hours they called EMS. On arrival in the ED he was alert, oriented, though but seemingly withdrawn, not volunteering any history, so most history is from wife. Vital signs normal, afebrile oral and rectal temperatures, with the exception of tachypnea without respiratory distress or hypoxia. Creatinine below discharge value at 1.91, K 5.1, glucose 133, BNP 1558 though CXR showed cardiomegaly without pulmonary edema or infiltrate. CBC w/diff normal. Lactic acid 3.01. TSH elevated mildly at 6.234 with normal free T4. Urinalysis of a clean catch specimen showed bacteria and TNTC WBCs. CT head showed atrophy and chronic microvascular ischemic changes without acute changes. Blood cultures were drawn, urine culture sent, and ceftriaxone given for empiric UTI treatment.   Review of Systems: He denies a full range of ROS specifically including fever, chills, changes in vision or hearing, headache, cough, sore throat, chest pain, palpitations, shortness of  breath, abdominal pain, nausea, vomiting, changes in bowel habits, blood in stool, urinary frequency, hesitancy, urgency, or dysuria, myalgias, arthralgias, and rash. All others reviewed and are negative.   Past Medical History:  Diagnosis Date  . Congestive heart failure (CHF) ([place])   . Diabetes mellitus without complication (HCC)    no medications  . Diverticulosis   . DVT (deep venous thrombosis) ([place])   . Heart disease   . Hyperlipemia   . Kidney stone   . NICM (nonischemic cardiomyopathy) ([place])   . OSA (obstructive sleep apnea)   . Stroke ([place])   . TIA (transient ischemic attack) 2008   was seen on CT   . Toe infection    Past Surgical History:  Procedure Laterality Date  . CARDIAC CATHETERIZATION N/A 12/01/2014   Procedure: Right/Left Heart Cath and Coronary Angiography;  Surgeon: Jettie Booze, MD;  Location: [facility];  Service: Cardiovascular;  Laterality: N/A;  . COLONOSCOPY  2013  . ROTATOR CUFF REPAIR Right    - Nonsmoker, no EtOH or other drugs. Lives with wife.   reports that  has never smoked. he has never used smokeless tobacco. He reports that he does not drink alcohol or use drugs. Allergies  Allergen Reactions  . Actos [Pioglitazone] Other (See Comments)    Slow HR and pain in face   . Anesthetics, Halogenated Other (See Comments)    Slept too long  . Crestor [Rosuvastatin Calcium]     Pain   . Sulfa Antibiotics Other (See Comments)    Hallucinations   . Dye Fdc Red [Red Dye] Rash    # 42   Family History  Problem Relation Age of Onset  . Stroke Mother   .  Hypertension Mother   . Heart attack Father   . Deep vein thrombosis Father   . Crohn's disease Brother   . Schizophrenia Sister    - Family history otherwise reviewed and not pertinent.  Prior to Admission medications   Medication Sig Start Date End Date Taking? Authorizing Provider  amiodarone (PACERONE) 200 MG tablet Take 1 tablet (200 mg total) by mouth 2 (two) times daily.  08/05/17   Clegg, Amy D, NP  aspirin 81 MG tablet Take 81 mg by mouth at bedtime.     [provider]  Cholecalciferol 2000 UNITS CAPS Take 1 capsule by mouth 2 (two) times daily.     [provider]  digoxin 62.5 MCG TABS Take 0.0625 mg by mouth daily. 08/06/17   Clegg, Amy D, NP  fenofibrate (TRICOR) 145 MG tablet Take 1 tablet (145 mg total) by mouth daily. Patient not taking: Reported on 07/25/2017 03/11/17   Nahser, Wonda Cheng, MD  GARLIC PO Take 1 tablet by mouth daily.    [provider]  losartan (COZAAR) 25 MG tablet Take 0.5 tablets (12.5 mg total) by mouth daily. 08/05/17   Clegg, Amy D, NP  MAGNESIUM PO Take 1 tablet by mouth 2 (two) times daily.     [provider]  Methylsulfonylmethane (MSM) 1000 MG CAPS Take 1,000 mg by mouth daily.     [provider]  potassium chloride SA (K-DUR,KLOR-CON) 20 MEQ tablet Take 2 tablets (40 mEq total) by mouth 2 (two) times daily. 08/05/17   Clegg, Amy D, NP  sodium chloride (OCEAN) 0.65 % SOLN nasal spray Place 1 spray into both nostrils as needed for congestion.    [provider]  spironolactone (ALDACTONE) 25 MG tablet Take 1 tablet (25 mg total) by mouth daily. 08/06/17   Clegg, Amy D, NP  torsemide (DEMADEX) 20 MG tablet Take 2 tablets (40 mg total) by mouth daily. 08/05/17   Clegg, Amy D, NP  warfarin (COUMADIN) 5 MG tablet Take 2.5-5 mg by mouth See admin instructions. Takes 2.5mg  Mon, Wed, Fri; Take 5mg  Tues, Thurs, Sat, Sun    [provider]    Physical Exam: Vitals:   08/09/17 1647 08/09/17 1647 08/09/17 1715 08/09/17 1730  BP: 137/79  116/74 111/63  Pulse: 66  67 67  Resp: (!) 23  (!) 28 (!) 27  Temp:  98.7 F (37.1 C)    TempSrc:  Rectal    SpO2: 93%  95% 95%   Constitutional: Well-appearing 83 y.o. male in no distress, calm demeanor Eyes: Lids and conjunctivae normal, PERRL ENMT: Mucous membranes are moist. Posterior pharynx clear of any exudate or lesions. Normal  dentition.  Neck: normal, supple, no masses, no thyromegaly Respiratory: Non-labored tachypnea on room air without accessory muscle use. Clear breath sounds to auscultation bilaterally Cardiovascular: Regular rate and rhythm, no murmurs, rubs, or gallops. Faint left carotid bruit. No JVD. Trace LE edema. + pedal pulses. Abdomen: Normoactive bowel sounds. No tenderness, non-distended, and no masses palpated. No hepatosplenomegaly. No CVA tenderness.  GU: No indwelling catheter Musculoskeletal: No clubbing / cyanosis. No joint deformity upper and lower extremities. Good ROM, no contractures. Normal muscle tone.  Skin: Warm, dry. No rashes, wounds, no ulcers. No significant lesions noted.  Neurologic: CN II-XII grossly intact. Speech is sparse but without aphasia/dysphasia. No focal deficits in motor strength or sensation in all extremities.  Psychiatric: Alert, oriented to [facility], thought it was Aug 01, 2017. Perseverated on recording his triglycerides when asked  what he thought the cause of his confusion was. Mood euthymic with flattened affect.   Labs on Admission: I have personally reviewed following labs and imaging studies  CBC: Recent Labs  Lab 08/05/17 0430 08/09/17 1534  WBC 8.2 10.2  NEUTROABS  --  6.9  HGB 12.9* 14.8  HCT 38.9* 44.5  MCV 80.0 82.1  PLT 210 664   Basic Metabolic Panel: Recent Labs  Lab 08/03/17 0430 08/03/17 1459 08/04/17 0340 08/05/17 0430 08/09/17 1534  NA 133* 133* 133* 137 139  K 3.1* 3.2* 3.2* 3.4* 5.1  CL 93* 91* 92* 96* 100*  CO2 26 27 27 29 25   GLUCOSE 156* 120* 154* 118* 133*  BUN 42* 45* 48* 54* 49*  CREATININE 1.86* 1.99* 1.93* 2.02* 1.91*  CALCIUM 8.9 8.9 9.0 9.1 10.3   GFR: Estimated Creatinine Clearance: 33.7 mL/min (A) (by C-G formula based on SCr of 1.91 mg/dL (H)). Liver Function Tests: Recent Labs  Lab 08/09/17 1534  AST 26  ALT 19  ALKPHOS 64  BILITOT 1.0  PROT 7.3  ALBUMIN 3.7   No results for input(s): LIPASE, AMYLASE in  the last 168 hours. Recent Labs  Lab 08/09/17 1534  AMMONIA 23   Coagulation Profile: Recent Labs  Lab 08/03/17 0430 08/04/17 0340 08/05/17 0430 08/09/17 1530  INR 2.21 2.24 2.36 3.38   Cardiac Enzymes: No results for input(s): CKTOTAL, CKMB, CKMBINDEX, TROPONINI in the last 168 hours. BNP (last 3 results) No results for input(s): PROBNP in the last 8760 hours. HbA1C: No results for input(s): HGBA1C in the last 72 hours. CBG: Recent Labs  Lab 08/09/17 1548  GLUCAP 115*   Lipid Profile: No results for input(s): CHOL, HDL, LDLCALC, TRIG, CHOLHDL, LDLDIRECT in the last 72 hours. Thyroid Function Tests: Recent Labs    08/09/17 1534  TSH 6.234*  FREET4 0.98   Anemia Panel: No results for input(s): VITAMINB12, FOLATE, FERRITIN, TIBC, IRON, RETICCTPCT in the last 72 hours. Urine analysis:    Component Value Date/Time   COLORURINE YELLOW 08/09/2017 1451   APPEARANCEUR CLEAR 08/09/2017 1451   LABSPEC 1.009 08/09/2017 1451   PHURINE 8.0 08/09/2017 1451   GLUCOSEU NEGATIVE 08/09/2017 1451   GLUCOSEU NEGATIVE 01/01/2015 1130   HGBUR SMALL (A) 08/09/2017 1451   BILIRUBINUR NEGATIVE 08/09/2017 1451   KETONESUR NEGATIVE 08/09/2017 1451   PROTEINUR NEGATIVE 08/09/2017 1451   UROBILINOGEN 0.2 01/01/2015 1130   NITRITE NEGATIVE 08/09/2017 1451   LEUKOCYTESUR LARGE (A) 08/09/2017 1451    No results found for this or any previous visit (from the past 240 hour(s)).   Radiological Exams on Admission: Dg Chest 2 View  Result Date: 08/09/2017 CLINICAL DATA:  Altered mentation.  Patient repeat things. EXAM: CHEST  2 VIEW COMPARISON:  07/25/2017 FINDINGS: Cardiomegaly is stable with minimal aortic atherosclerosis. Clear lungs without active pulmonary disease. No acute osseous abnormality. Degenerative changes are seen dorsal spine as before. IMPRESSION: Stable cardiomegaly without active pulmonary disease. Aortic atherosclerosis without aneurysm. Electronically Signed   By: Ashley Royalty M.D.   On: 08/09/2017 16:41   Ct Head Wo Contrast  Result Date: 08/09/2017 CLINICAL DATA:  Altered mental status today. EXAM: CT HEAD WITHOUT CONTRAST TECHNIQUE: Contiguous axial images were obtained from the base of the skull through the vertex without intravenous contrast. COMPARISON:  Brain MRI 01/25/2017. FINDINGS: Brain: There is cortical atrophy. Remote lacunar infarctions in the left caudate and right cerebellum are noted. No evidence of acute abnormality including hemorrhage, infarct, mass lesion, mass effect, midline  shift or abnormal extra-axial fluid collection. No hydrocephalus or pneumocephalus. Vascular: Atherosclerosis noted. Skull: Intact. Sinuses/Orbits: Negative. Other: None. IMPRESSION: No acute abnormality. Atrophy and chronic microvascular ischemic change. Atherosclerosis. Electronically Signed   By: Inge Rise M.D.   On: 08/09/2017 16:26   EKG: Independently reviewed. NSR with incomplete LBBB.   Assessment/Plan Principal Problem:   Acute delirium Active Problems:   DVT (deep venous thrombosis) (HCC)   Chronic combined systolic and diastolic CHF (congestive heart failure) (HCC)   NICM (nonischemic cardiomyopathy) ([place])   Acute delirium: Uncertain etiology, though will treat UTI empirically. Also suspect he's at increased risk for early vascular dementia and delirium given age, h/o CVA/atrophy and microvascular damage on CT head, and infection. - Monitor with delirium precautions.   UTI: Significant pyuria w/bacteria on clean catch. Has h/o UTI's without micro data to guide therapy. Lactate elevated, though does not appears septic or hypoxic. Not on metformin.  - Ceftriaxone 1g q24h.  - Monitor urine culture and blood cultures.  Chronic combined CHF, NICM: Echo 07/16/2017 LVEF 25%, Grade 2 DD, Trivial AI, Mod MR, Sev LAE; no CAD on cath 2016. Appears euvolemic, wt down to 202lbs with creatinine stable. No pulmonary edema, crackles.  - Continue home medications.  Will hold PM dose of potassium given it's at the ULN.  - Has appt w/[facility] 1/30 at 2pm.  - Daily weights, I/O  History of DVT on coumadin with supratherapeutic INR:  - Hold coumadin tonight, recheck INR in AM  Stage III CKD: Creatinine at/below baseline.  - Continue medications - Avoid nephrotoxins  History of diabetes: Last HbA1c 6.1% - Check HbA1c, check BMP in AM  DVT prophylaxis: INR supratherapeutic  Code Status: Full  Family Communication: Wife and SIL at bedside Disposition Plan: DC home if delirium resolved Consults called: None  Admission status: Observation    Vance Gather, MD [facility] Pager [phone]  If 7PM-7AM, please contact night-coverage [email or web address] Password [facility] 08/09/2017, 6:09 PM

## 2017-08-09 NOTE — ED Triage Notes (Signed)
Pt brought in by [facility] from home for AMS per family. Family states pt has been having repetitive tendencies this morning, state pt repeated his morning routine several times. Pt d/c last Wednesday after 2 week stay for CHF. Pt is currently A+Ox4 and has no c/o.

## 2017-08-10 DIAGNOSIS — R410 Disorientation, unspecified: Secondary | ICD-10-CM | POA: Diagnosis not present

## 2017-08-10 LAB — BASIC METABOLIC PANEL
Anion gap: 15 (ref 5–15)
BUN: 53 mg/dL — AB (ref 6–20)
CHLORIDE: 97 mmol/L — AB (ref 101–111)
CO2: 27 mmol/L (ref 22–32)
CREATININE: 2.19 mg/dL — AB (ref 0.61–1.24)
Calcium: 9.7 mg/dL (ref 8.9–10.3)
GFR calc non Af Amer: 27 mL/min — ABNORMAL LOW (ref >60)
GFR, EST AFRICAN AMERICAN: 31 mL/min — AB (ref >60)
GLUCOSE: 112 mg/dL — AB (ref 65–99)
Potassium: 4.8 mmol/L (ref 3.5–5.1)
Sodium: 139 mmol/L (ref 135–145)

## 2017-08-10 LAB — LACTIC ACID, PLASMA: LACTIC ACID, VENOUS: 2.8 mmol/L — AB (ref 0.5–1.9)

## 2017-08-10 LAB — PROTIME-INR
INR: 3.72
Prothrombin Time: 36.6 seconds — ABNORMAL HIGH (ref 11.4–15.2)

## 2017-08-10 LAB — HEMOGLOBIN A1C
Hgb A1c MFr Bld: 6.2 % — ABNORMAL HIGH (ref 4.8–5.6)
Mean Plasma Glucose: 131.24 mg/dL

## 2017-08-10 MED ORDER — POTASSIUM CHLORIDE CRYS ER 20 MEQ PO TBCR: 20.0000 meq | EXTENDED_RELEASE_TABLET | Freq: Two times a day (BID) | ORAL | 6 refills | Status: DC

## 2017-08-10 MED ORDER — CEFTRIAXONE SODIUM 1 G IJ SOLR
1.0000 g | Freq: Once | INTRAMUSCULAR | Status: AC
  Administered 2017-08-10: 1 g via INTRAVENOUS
  Filled 2017-08-10: qty 10

## 2017-08-10 MED ORDER — CEPHALEXIN 500 MG PO CAPS: 500.0000 mg | ORAL_CAPSULE | Freq: Two times a day (BID) | ORAL | 0 refills | Status: AC

## 2017-08-10 NOTE — Discharge Summary (Signed)
Physician Discharge Summary  Michael Randolph FBP:[id number] DOB: 11/14/1934 DOA: 08/09/2017  PCP: Shon Baton, MD  Admit date: 08/09/2017 Discharge date: 08/10/2017  Admitted From: Home Disposition: Home   Recommendations for Outpatient Follow-up:  1. Follow up with HF clinic as scheduled 1/30. Weight at DC is 199lbs. 2. Monitor PT/INR, mildly supratherapeutic, so warfarin held 1/27 and 1/28. Consider repeat BMP to monitor high-normal K.  3. Continue keflex 500mg  po BID x5 more days for UTI, urine culture pending.  Home Health: None new Equipment/Devices: None new Discharge Condition: Stable CODE STATUS: Full Diet recommendation: Heart healthy  Brief/Interim Summary: Michael Randolph is a 83 y.o. male with a history of chronic CHF, recently discharged from 2-week admission for exacerbation, T2DM, CVA, OSA who was brought by EMS due to an episode of confusion this morning. He had been taking medications regularly, documenting vitals, daily weights, medication administration, down to 202lbs from 207lbs at discharge on 1/23. This morning around 10:30am he became confused, repeatedly adjusting a mechanical chair repeating that he needed to document his blood pressure despite having just completed that task. He grew aggravated by his wife and son's attempts at redirection. His wife denies ever hearing slurred speech or seeing focal weakness, facial droop. When he continued like this for 2 hours they called EMS. On arrival in the ED he was alert, oriented, though but seemingly withdrawn, not volunteering any history, so most history is from wife. Vital signs normal, afebrile oral and rectal temperatures, with the exception of tachypnea without respiratory distress or hypoxia. Creatinine below discharge value at 1.91, K 5.1, glucose 133, BNP 1558 though CXR showed cardiomegaly without pulmonary edema or infiltrate. CBC w/diff normal. Lactic acid 3.01. TSH elevated mildly at 6.234 with normal free T4.  Urinalysis of a clean catch specimen showed bacteria and TNTC WBCs. CT head showed atrophy and chronic microvascular ischemic changes without acute changes. Blood cultures were drawn, urine culture sent, and ceftriaxone given for empiric UTI treatment.   Mental status returned to baseline for the duration of the observation period. He will be discharged with empiric treatment for UTI and follow up as scheduled in HF clinic. Weight at DC is 199lbs.   Discharge Diagnoses:  Principal Problem:   Acute delirium Active Problems:   DVT (deep venous thrombosis) (HCC)   Chronic combined systolic and diastolic CHF (congestive heart failure) (HCC)   NICM (nonischemic cardiomyopathy) ([place])   Acute lower UTI  Acute delirium: Suspect due to UTI, since resolved. Also suspect he's at increased risk for early vascular dementia and delirium given age, h/o CVA/atrophy and microvascular damage on CT head. - Return to familiar environment, delirium precautions.   UTI: Significant pyuria w/bacteria on clean catch. Has h/o UTI's without micro data to guide therapy. Lactate elevated, though does not appears septic or hypoxic. Not on metformin.  - Ceftriaxone given 1/27 and 1/28, continue keflex x5 more days (1/29 - 2/2) - Monitor urine culture and blood cultures.  Chronic combined CHF, NICM: Echo 07/16/2017 LVEF 25%, Grade 2 DD, Trivial AI, Mod MR, Sev LAE; no CAD on cath 2016. Appears euvolemic, wt down to 202lbs with creatinine stable. No pulmonary edema, crackles.  - Continue home medications. Decrease po potassium to 87mEq twice daily.  - Has appt w/AHF 1/30 at 2pm.  - Daily weights to continue at home.  History of DVT on coumadin with supratherapeutic INR:  - Hold coumadin tonight, recheck INR at follow up  Stage III CKD: Creatinine at baseline.  -  Continue medications - Avoid nephrotoxins  History of diabetes: HbA1c 6.2%  Discharge Instructions Discharge Instructions    Diet - low sodium heart  healthy   Complete by:  As directed    Discharge instructions   Complete by:  As directed    You were admitted for delirium which has improved, suspected to be due to UTI.  - Continue taking keflex twice daily for 5 more days, starting 1/29.  - Hold warfarin tonight, and restart at your usual dose on 1/29.  - Decrease potassium to only 1 tablet twice a day (down from 2 tablets twice daily) - Follow up with your heart failure clinic as scheduled 1/30.  - If confusion returns, or slurred speech, localized weakness/numbness, or facial droop develop, seek medical attention right away.   Increase activity slowly   Complete by:  As directed      Allergies as of 08/10/2017      Reactions   Actos [pioglitazone] Other (See Comments)   Slow HR and pain in face    Anesthetics, Halogenated Other (See Comments)   Slept too long   Crestor [rosuvastatin Calcium]    Pain    Sulfa Antibiotics Other (See Comments)   Hallucinations   Dye Fdc Red [red Dye] Rash   # 40      Medication List    TAKE these medications   amiodarone 200 MG tablet Commonly known as:  PACERONE Take 1 tablet (200 mg total) by mouth 2 (two) times daily.   aspirin 81 MG tablet Take 81 mg by mouth at bedtime.   cephALEXin 500 MG capsule Commonly known as:  KEFLEX Take 1 capsule (500 mg total) by mouth 2 (two) times daily for 5 days. Start taking on:  08/11/2017   Cholecalciferol 2000 units Caps Take 2,000 Units by mouth 2 (two) times daily.   digoxin 0.125 MG tablet Commonly known as:  LANOXIN Take 62.5 mcg by mouth daily. What changed:  Another medication with the same name was removed. Continue taking this medication, and follow the directions you see here.   fenofibrate 145 MG tablet Commonly known as:  TRICOR Take 1 tablet (145 mg total) by mouth daily.   GARLIC PO Take 1 tablet by mouth daily.   losartan 25 MG tablet Commonly known as:  COZAAR Take 0.5 tablets (12.5 mg total) by mouth daily.    MAGNESIUM PO Take 1 tablet by mouth 2 (two) times daily.   MSM 1000 MG Caps Take 1,000 mg by mouth daily.   potassium chloride SA 20 MEQ tablet Commonly known as:  K-DUR,KLOR-CON Take 1 tablet (20 mEq total) by mouth 2 (two) times daily. What changed:  how much to take   sodium chloride 0.65 % Soln nasal spray Commonly known as:  OCEAN Place 1 spray into both nostrils as needed for congestion.   spironolactone 25 MG tablet Commonly known as:  ALDACTONE Take 1 tablet (25 mg total) by mouth daily.   torsemide 20 MG tablet Commonly known as:  DEMADEX Take 2 tablets (40 mg total) by mouth daily.   warfarin 5 MG tablet Commonly known as:  COUMADIN Take 2.5-5 mg by mouth See admin instructions. 5 mg by mouth at bedtime on Sun/Tues/Thurs/Sat and 2.5 mg on Mon/Wed/Fri      Follow-up Information    Shon Baton, MD Follow up.   Specialty:  Internal Medicine Contact information: [address] [place] [address] [phone]          Allergies  Allergen Reactions  . Actos [Pioglitazone] Other (See Comments)    Slow HR and pain in face   . Anesthetics, Halogenated Other (See Comments)    Slept too long  . Crestor [Rosuvastatin Calcium]     Pain   . Sulfa Antibiotics Other (See Comments)    Hallucinations   . Dye Fdc Red [Red Dye] Rash    # 29    Consultations:  HF team notified of admission.   Procedures/Studies: Dg Chest 2 View  Result Date: 08/09/2017 CLINICAL DATA:  Altered mentation.  Patient repeat things. EXAM: CHEST  2 VIEW COMPARISON:  07/25/2017 FINDINGS: Cardiomegaly is stable with minimal aortic atherosclerosis. Clear lungs without active pulmonary disease. No acute osseous abnormality. Degenerative changes are seen dorsal spine as before. IMPRESSION: Stable cardiomegaly without active pulmonary disease. Aortic atherosclerosis without aneurysm. Electronically Signed   By: Ashley Royalty M.D.   On: 08/09/2017 16:41   Dg Chest 2 View  Result Date:  07/25/2017 CLINICAL DATA:  Shortness of Breath EXAM: CHEST  2 VIEW COMPARISON:  05/27/2016 FINDINGS: Low lung volumes with bibasilar atelectasis. Heart is mildly enlarged. No significant effusions. No acute bony abnormality. IMPRESSION: Borderline cardiomegaly. Low lung volumes with bibasilar atelectasis. Electronically Signed   By: Rolm Baptise M.D.   On: 07/25/2017 13:05   Ct Head Wo Contrast  Result Date: 08/09/2017 CLINICAL DATA:  Altered mental status today. EXAM: CT HEAD WITHOUT CONTRAST TECHNIQUE: Contiguous axial images were obtained from the base of the skull through the vertex without intravenous contrast. COMPARISON:  Brain MRI 01/25/2017. FINDINGS: Brain: There is cortical atrophy. Remote lacunar infarctions in the left caudate and right cerebellum are noted. No evidence of acute abnormality including hemorrhage, infarct, mass lesion, mass effect, midline shift or abnormal extra-axial fluid collection. No hydrocephalus or pneumocephalus. Vascular: Atherosclerosis noted. Skull: Intact. Sinuses/Orbits: Negative. Other: None. IMPRESSION: No acute abnormality. Atrophy and chronic microvascular ischemic change. Atherosclerosis. Electronically Signed   By: Inge Rise M.D.   On: 08/09/2017 16:26    Subjective: Feels well, no events overnight. Oriented.   Discharge Exam: Vitals:   08/09/17 2138 08/10/17 0645  BP: 122/71 (!) 99/56  Pulse: 70 (!) 58  Resp: 17 18  Temp: 98.3 F (36.8 C) 97.8 F (36.6 C)  SpO2: 96% 96%   General: Pt is alert, awake, not in acute distress Cardiovascular: RRR, S1/S2 +, no rubs, no gallops. No JVD and only trace LE edema.  Respiratory: CTA bilaterally, no wheezing, no rhonchi Abdominal: Soft, NT, ND, bowel sounds + Neuro: Alert, oriented, no focal deficits.   Labs: BNP (last 3 results) Recent Labs    07/25/17 1208 08/09/17 1534  BNP 1,864.1* 5,361.4*   Basic Metabolic Panel: Recent Labs  Lab 08/03/17 1459 08/04/17 0340 08/05/17 0430  08/09/17 1534 08/10/17 0331  NA 133* 133* 137 139 139  K 3.2* 3.2* 3.4* 5.1 4.8  CL 91* 92* 96* 100* 97*  CO2 27 27 29 25 27   GLUCOSE 120* 154* 118* 133* 112*  BUN 45* 48* 54* 49* 53*  CREATININE 1.99* 1.93* 2.02* 1.91* 2.19*  CALCIUM 8.9 9.0 9.1 10.3 9.7   Liver Function Tests: Recent Labs  Lab 08/09/17 1534  AST 26  ALT 19  ALKPHOS 64  BILITOT 1.0  PROT 7.3  ALBUMIN 3.7   No results for input(s): LIPASE, AMYLASE in the last 168 hours. Recent Labs  Lab 08/09/17 1534  AMMONIA 23   CBC: Recent Labs  Lab 08/05/17 0430 08/09/17 1534  WBC 8.2  10.2  NEUTROABS  --  6.9  HGB 12.9* 14.8  HCT 38.9* 44.5  MCV 80.0 82.1  PLT 210 258   Cardiac Enzymes: No results for input(s): CKTOTAL, CKMB, CKMBINDEX, TROPONINI in the last 168 hours. BNP: Invalid input(s): POCBNP CBG: Recent Labs  Lab 08/09/17 1548  GLUCAP 115*   D-Dimer No results for input(s): DDIMER in the last 72 hours. Hgb A1c Recent Labs    08/10/17 0331  HGBA1C 6.2*   Lipid Profile No results for input(s): CHOL, HDL, LDLCALC, TRIG, CHOLHDL, LDLDIRECT in the last 72 hours. Thyroid function studies Recent Labs    08/09/17 1534  TSH 6.234*   Anemia work up No results for input(s): VITAMINB12, FOLATE, FERRITIN, TIBC, IRON, RETICCTPCT in the last 72 hours. Urinalysis    Component Value Date/Time   COLORURINE YELLOW 08/09/2017 1451   APPEARANCEUR CLEAR 08/09/2017 1451   LABSPEC 1.009 08/09/2017 1451   PHURINE 8.0 08/09/2017 1451   GLUCOSEU NEGATIVE 08/09/2017 1451   GLUCOSEU NEGATIVE 01/01/2015 1130   HGBUR SMALL (A) 08/09/2017 1451   BILIRUBINUR NEGATIVE 08/09/2017 1451   KETONESUR NEGATIVE 08/09/2017 1451   PROTEINUR NEGATIVE 08/09/2017 1451   UROBILINOGEN 0.2 01/01/2015 1130   NITRITE NEGATIVE 08/09/2017 1451   LEUKOCYTESUR LARGE (A) 08/09/2017 1451    Microbiology Recent Results (from the past 240 hour(s))  Culture, blood (routine x 2)     Status: None (Preliminary result)    Collection Time: 08/09/17  4:50 PM  Result Value Ref Range Status   Specimen Description BLOOD LEFT HAND  Final   Special Requests IN PEDIATRIC BOTTLE Blood Culture adequate volume  Final   Culture PENDING  Incomplete   Report Status PENDING  Incomplete  Culture, blood (routine x 2)     Status: None (Preliminary result)   Collection Time: 08/09/17  5:38 PM  Result Value Ref Range Status   Specimen Description BLOOD LEFT FOREARM  Final   Special Requests IN PEDIATRIC BOTTLE Blood Culture adequate volume  Final   Culture PENDING  Incomplete   Report Status PENDING  Incomplete    Time coordinating discharge: Approximately 40 minutes  Vance Gather, MD  [facility] 08/10/2017, 10:02 AM Pager [phone]

## 2017-08-10 NOTE — Progress Notes (Signed)
Pt given discharge instructions, prescriptions, and care notes. Pt verbalized understanding AEB no further questions or concerns at this time. IV was discontinued, no redness, pain, or swelling noted at this time. Pt to leave the floor via wheelchair with staff in stable condition. 

## 2017-08-10 NOTE — Progress Notes (Signed)
CRITICAL VALUE ALERT  Critical Value:  Lactic acid 2.8  Date & Time Notied:  08/10/17 @ 1148  Provider Notified: Dr Bonner Puna paged  Orders Received/Actions taken: Will await orders

## 2017-08-11 LAB — URINE CULTURE: SPECIAL REQUESTS: NORMAL

## 2017-08-12 ENCOUNTER — Encounter (HOSPITAL_COMMUNITY): Payer: Medicare Other

## 2017-08-12 ENCOUNTER — Ambulatory Visit (HOSPITAL_COMMUNITY)
Admission: RE | Admit: 2017-08-12 | Discharge: 2017-08-12 | Disposition: A | Discharge status: Home / Self Care | Payer: Medicare Other | Source: Ambulatory Visit | Attending: Cardiology | Admitting: Cardiology

## 2017-08-12 VITALS — BP 118/72 | HR 71 | Wt 194.4 lb

## 2017-08-12 DIAGNOSIS — I429 Cardiomyopathy, unspecified: Secondary | ICD-10-CM | POA: Diagnosis not present

## 2017-08-12 DIAGNOSIS — I517 Cardiomegaly: Secondary | ICD-10-CM | POA: Diagnosis not present

## 2017-08-12 DIAGNOSIS — Z7982 Long term (current) use of aspirin: Secondary | ICD-10-CM | POA: Insufficient documentation

## 2017-08-12 DIAGNOSIS — I5022 Chronic systolic (congestive) heart failure: Secondary | ICD-10-CM

## 2017-08-12 DIAGNOSIS — G4733 Obstructive sleep apnea (adult) (pediatric): Secondary | ICD-10-CM | POA: Diagnosis not present

## 2017-08-12 DIAGNOSIS — Z8673 Personal history of transient ischemic attack (TIA), and cerebral infarction without residual deficits: Secondary | ICD-10-CM | POA: Diagnosis not present

## 2017-08-12 DIAGNOSIS — R4182 Altered mental status, unspecified: Secondary | ICD-10-CM | POA: Insufficient documentation

## 2017-08-12 DIAGNOSIS — I5042 Chronic combined systolic (congestive) and diastolic (congestive) heart failure: Secondary | ICD-10-CM | POA: Insufficient documentation

## 2017-08-12 DIAGNOSIS — E785 Hyperlipidemia, unspecified: Secondary | ICD-10-CM | POA: Insufficient documentation

## 2017-08-12 DIAGNOSIS — I2510 Atherosclerotic heart disease of native coronary artery without angina pectoris: Secondary | ICD-10-CM | POA: Diagnosis not present

## 2017-08-12 DIAGNOSIS — Z86718 Personal history of other venous thrombosis and embolism: Secondary | ICD-10-CM | POA: Diagnosis not present

## 2017-08-12 DIAGNOSIS — Z7901 Long term (current) use of anticoagulants: Secondary | ICD-10-CM | POA: Insufficient documentation

## 2017-08-12 DIAGNOSIS — R0989 Other specified symptoms and signs involving the circulatory and respiratory systems: Secondary | ICD-10-CM

## 2017-08-12 DIAGNOSIS — Z87442 Personal history of urinary calculi: Secondary | ICD-10-CM | POA: Insufficient documentation

## 2017-08-12 DIAGNOSIS — N390 Urinary tract infection, site not specified: Secondary | ICD-10-CM | POA: Insufficient documentation

## 2017-08-12 DIAGNOSIS — I340 Nonrheumatic mitral (valve) insufficiency: Secondary | ICD-10-CM

## 2017-08-12 DIAGNOSIS — I493 Ventricular premature depolarization: Secondary | ICD-10-CM | POA: Diagnosis not present

## 2017-08-12 DIAGNOSIS — E119 Type 2 diabetes mellitus without complications: Secondary | ICD-10-CM | POA: Diagnosis not present

## 2017-08-12 DIAGNOSIS — Z79899 Other long term (current) drug therapy: Secondary | ICD-10-CM | POA: Diagnosis not present

## 2017-08-12 DIAGNOSIS — I272 Pulmonary hypertension, unspecified: Secondary | ICD-10-CM | POA: Insufficient documentation

## 2017-08-12 DIAGNOSIS — I440 Atrioventricular block, first degree: Secondary | ICD-10-CM | POA: Diagnosis not present

## 2017-08-12 MED ORDER — TORSEMIDE 20 MG PO TABS: 20.0000 mg | ORAL_TABLET | Freq: Every day | ORAL | 11 refills | Status: DC

## 2017-08-12 MED ORDER — POTASSIUM CHLORIDE CRYS ER 20 MEQ PO TBCR: 20.0000 meq | EXTENDED_RELEASE_TABLET | Freq: Every day | ORAL | 11 refills | Status: DC

## 2017-08-12 MED ORDER — AMIODARONE HCL 200 MG PO TABS: 200.0000 mg | ORAL_TABLET | Freq: Every day | ORAL | 11 refills | Status: DC

## 2017-08-12 NOTE — Patient Instructions (Signed)
DECREASE Amiodarone to 200 mg tablet ONCE daily.  DECREASE Torsemide to 20 mg tablet ONCE daily.  DECREASE Potassium to 20 meq tablet once daily.  Follow up with Dr. Haroldine Laws in 3-4 weeks.  Take all medication as prescribed the day of your appointment. Bring all medications with you to your appointment.  Do the following things EVERYDAY: 1) Weigh yourself in the morning before breakfast. Write it down and keep it in a log. 2) Take your medicines as prescribed 3) Eat low salt foods-Limit salt (sodium) to 2000 mg per day.  4) Stay as active as you can everyday 5) Limit all fluids for the day to less than 2 liters

## 2017-08-12 NOTE — Progress Notes (Signed)
PCP: Primary HF Cardiologist: Dr Haroldine Laws   HPI: Michael Randolph a 83 y.o.malewith past medical history of nonischemic cardiomyopathy, PVCs, DVT, MR, DMII, CVA,and left carotid bruit.   He had stopped all HF medications September 2018 and started feeling poorly in December 2018. Admitted 07/25/17 with marked volume overload and was diuresed IV lasix but had poor response. ECHO completed and showed EF 25% , RV mildly dilated, and grade II DD. Started on milrinone to support RV and diurese. Diuresed with IV lasix + metolazone and later transitioned to torsemide 40 mg daily. Renal function was followed closely with diuretics adjusted. He was not placed on ARB due to elevated creatinine. He was not placed on bb due to low output. He had PVCs prior to admit but with increasing frequency after starting milrinone so IV amio was started. Once milrinone was stopped he transitioned to amiodarone 200 mg twice daily. PVCs reduced after milrinone was stopped. He was on coumadin prior to admit for history of DVT. INR will continue to be managed in the community by Dr Virgina Jock. Discharged 08/05/2017-->207 pounds.   Readmitted 1/27 with AMS due to UTI. Treated with antibiotics and discharged the next day. Discharged on keflex x 5 days.   Today he presents for post hospital follow up. Overall feeling fine. Denies SOB/PND/Orthopnea. Appetite ok. No fever or chills. No bleeding problems. Weight at home has been trending down from 206-->198 pounds. Walking in his house multiple times a day. No bleeding problems. Taking all medications.   ECHO 07/2017 Mildly dilated LV with mild LV hypertrophy, EF 25%. Diffuse   hypokinesis with inferolateral akinesis. Moderate diastolic   dysfunction. Mildly dilated RV with moderately decreased systolic   function. Tethering of the posterior mitral leaflet with at least   moderate mitral regurgitation. Borderline pulmonary hypertension  ROS: All systems negative except as listed  in HPI, PMH and Problem List.  SH:  Social History   Socioeconomic History  . Marital status: Married    Spouse name: Not on file  . Number of children: 3  . Years of education: Associates  . Highest education level: Not on file  Social Needs  . Financial resource strain: Not on file  . Food insecurity - worry: Not on file  . Food insecurity - inability: Not on file  . Transportation needs - medical: Not on file  . Transportation needs - non-medical: Not on file  Occupational History  . Occupation: Retired   Tobacco Use  . Smoking status: Never Smoker  . Smokeless tobacco: Never Used  Substance and Sexual Activity  . Alcohol use: No    Alcohol/week: 0.0 oz  . Drug use: No  . Sexual activity: Yes  Other Topics Concern  . Not on file  Social History Narrative   Drinks 1-2 cups of coffee a day     FH:  Family History  Problem Relation Age of Onset  . Stroke Mother   . Hypertension Mother   . Heart attack Father   . Deep vein thrombosis Father   . Crohn's disease Brother   . Schizophrenia Sister     Past Medical History:  Diagnosis Date  . Congestive heart failure (CHF) ([place])   . Diabetes mellitus without complication (HCC)    no medications  . Diverticulosis   . DVT (deep venous thrombosis) ([place])   . Heart disease   . Hyperlipemia   . Kidney stone   . NICM (nonischemic cardiomyopathy) ([place])   . OSA (obstructive  sleep apnea)   . Stroke ([place])   . TIA (transient ischemic attack) 2008   was seen on CT   . Toe infection     Current Outpatient Medications  Medication Sig Dispense Refill  . amiodarone (PACERONE) 200 MG tablet Take 1 tablet (200 mg total) by mouth 2 (two) times daily. 60 tablet 6  . aspirin 81 MG tablet Take 81 mg by mouth at bedtime.     . cephALEXin (KEFLEX) 500 MG capsule Take 1 capsule (500 mg total) by mouth 2 (two) times daily for 5 days. 10 capsule 0  . Cholecalciferol 2000 UNITS CAPS Take 2,000 Units by mouth 2 (two) times daily.     .  digoxin (LANOXIN) 0.125 MG tablet Take 62.5 mcg by mouth daily.    Marland Kitchen GARLIC PO Take 1 tablet by mouth daily.    Marland Kitchen losartan (COZAAR) 25 MG tablet Take 0.5 tablets (12.5 mg total) by mouth daily. 90 tablet 3  . MAGNESIUM PO Take 1 tablet by mouth 2 (two) times daily.     . Methylsulfonylmethane (MSM) 1000 MG CAPS Take 1,000 mg by mouth daily.     . potassium chloride SA (K-DUR,KLOR-CON) 20 MEQ tablet Take 1 tablet (20 mEq total) by mouth 2 (two) times daily. 120 tablet 6  . sodium chloride (OCEAN) 0.65 % SOLN nasal spray Place 1 spray into both nostrils as needed for congestion.    Marland Kitchen spironolactone (ALDACTONE) 25 MG tablet Take 1 tablet (25 mg total) by mouth daily. 30 tablet 6  . torsemide (DEMADEX) 20 MG tablet Take 2 tablets (40 mg total) by mouth daily. 60 tablet 6  . warfarin (COUMADIN) 5 MG tablet Take 2.5-5 mg by mouth See admin instructions. 5 mg by mouth at bedtime on Sun/Tues/Thurs/Sat and 2.5 mg on Mon/Wed/Fri     No current facility-administered medications for this encounter.     Vitals:   08/12/17 1340  BP: 118/72  Pulse: 71  SpO2: 96%  Weight: 194 lb 6 oz (88.2 kg)    PHYSICAL EXAM: General:  Elderly.  No resp difficulty. Walked in the clinic without difficulty.  HEENT: normal Neck: supple. JVP 5-6  flat. Carotids 2+ bilaterally; no bruits. No lymphadenopathy or thryomegaly appreciated.L carotid bruit Cor: PMI normal. Regular rate & rhythm. No rubs, gallops or murmurs. Lungs: clear Abdomen: soft, nontender, nondistended. No hepatosplenomegaly. No bruits or masses. Good bowel sounds. Extremities: no cyanosis, clubbing, rash, edema Neuro: alert & orientedx3, cranial nerves grossly intact. Moves all 4 extremities w/o difficulty. Affect pleasant.   EKG:  NSR 1st degree heart block 66 bpm     ASSESSMENT & PLAN: 1. Chronic Combined Diastolic/Systolic Heart Failure- NICM  -Had LHC 2016 with nonobstructive CAD.  -ECHO 07/2017 EF 25% Grade II DD, Mod MR, Severe LAE. Plan to  repeat ECHO in 3 months after HF meds optimized.  NYHA II.- Volume status low. Cut back torsemide to 20 mg daily .  -Cut back potassium to 20 meq daily.  No bb for now. Can consider at the next visit.  Continue losartan 12.5 mg daily and  25 mg spironolactone daily.  BMEt reviewed from 1/28 . Plan to repeat next visit.   2. PVCs No PVCs on EKG today. Some question about PVCs prior to admit.    Cut back amio to 200 mg daily. Anticipate stopping down the road. May need to place Holter monitor if has recurrence.   3. Moderate MR Repeat ECHO in 3 months  4. L  carotid bruit Carotid US 9/.2018. Repeat in 03/2018  5. H/O DVT On long term anticoagulation.  On coumadin. INR followed by PCP.  He was instructed to follow for INR later this week.   Follow up in 2-3 weeks.  Manali Mcelmurry NP-C  9:17 AM

## 2017-08-14 LAB — CULTURE, BLOOD (ROUTINE X 2)
CULTURE: NO GROWTH
Culture: NO GROWTH
SPECIAL REQUESTS: ADEQUATE
Special Requests: ADEQUATE

## 2017-08-20 ENCOUNTER — Other Ambulatory Visit: Payer: Self-pay | Admitting: Internal Medicine

## 2017-08-20 DIAGNOSIS — I82403 Acute embolism and thrombosis of unspecified deep veins of lower extremity, bilateral: Secondary | ICD-10-CM | POA: Diagnosis not present

## 2017-08-20 DIAGNOSIS — N183 Chronic kidney disease, stage 3 (moderate): Secondary | ICD-10-CM | POA: Diagnosis not present

## 2017-08-20 DIAGNOSIS — Z7901 Long term (current) use of anticoagulants: Secondary | ICD-10-CM | POA: Diagnosis not present

## 2017-08-20 DIAGNOSIS — N390 Urinary tract infection, site not specified: Secondary | ICD-10-CM | POA: Diagnosis not present

## 2017-08-20 DIAGNOSIS — I5021 Acute systolic (congestive) heart failure: Secondary | ICD-10-CM | POA: Diagnosis not present

## 2017-08-20 DIAGNOSIS — R7989 Other specified abnormal findings of blood chemistry: Secondary | ICD-10-CM

## 2017-08-26 DIAGNOSIS — R944 Abnormal results of kidney function studies: Secondary | ICD-10-CM | POA: Diagnosis not present

## 2017-08-27 ENCOUNTER — Ambulatory Visit
Admission: RE | Admit: 2017-08-27 | Discharge: 2017-08-27 | Disposition: A | Discharge status: Home / Self Care | Payer: Medicare Other | Source: Ambulatory Visit | Attending: Internal Medicine | Admitting: Internal Medicine

## 2017-08-27 DIAGNOSIS — N1330 Unspecified hydronephrosis: Secondary | ICD-10-CM | POA: Diagnosis not present

## 2017-08-27 DIAGNOSIS — R7989 Other specified abnormal findings of blood chemistry: Secondary | ICD-10-CM

## 2017-08-31 DIAGNOSIS — R338 Other retention of urine: Secondary | ICD-10-CM | POA: Diagnosis not present

## 2017-08-31 DIAGNOSIS — R3912 Poor urinary stream: Secondary | ICD-10-CM | POA: Diagnosis not present

## 2017-08-31 DIAGNOSIS — N1330 Unspecified hydronephrosis: Secondary | ICD-10-CM | POA: Diagnosis not present

## 2017-08-31 DIAGNOSIS — N401 Enlarged prostate with lower urinary tract symptoms: Secondary | ICD-10-CM | POA: Diagnosis not present

## 2017-08-31 DIAGNOSIS — N132 Hydronephrosis with renal and ureteral calculous obstruction: Secondary | ICD-10-CM | POA: Diagnosis not present

## 2017-09-01 DIAGNOSIS — N1330 Unspecified hydronephrosis: Secondary | ICD-10-CM | POA: Diagnosis not present

## 2017-09-01 DIAGNOSIS — Z86718 Personal history of other venous thrombosis and embolism: Secondary | ICD-10-CM | POA: Diagnosis not present

## 2017-09-01 DIAGNOSIS — Z7901 Long term (current) use of anticoagulants: Secondary | ICD-10-CM | POA: Diagnosis not present

## 2017-09-01 DIAGNOSIS — I82403 Acute embolism and thrombosis of unspecified deep veins of lower extremity, bilateral: Secondary | ICD-10-CM | POA: Diagnosis not present

## 2017-09-01 DIAGNOSIS — R944 Abnormal results of kidney function studies: Secondary | ICD-10-CM | POA: Diagnosis not present

## 2017-09-14 DIAGNOSIS — R339 Retention of urine, unspecified: Secondary | ICD-10-CM | POA: Diagnosis not present

## 2017-09-15 DIAGNOSIS — Z7901 Long term (current) use of anticoagulants: Secondary | ICD-10-CM | POA: Diagnosis not present

## 2017-09-15 DIAGNOSIS — I82403 Acute embolism and thrombosis of unspecified deep veins of lower extremity, bilateral: Secondary | ICD-10-CM | POA: Diagnosis not present

## 2017-09-16 ENCOUNTER — Encounter (HOSPITAL_COMMUNITY): Payer: Self-pay | Admitting: Internal Medicine

## 2017-09-16 ENCOUNTER — Ambulatory Visit (HOSPITAL_COMMUNITY)
Admission: RE | Admit: 2017-09-16 | Discharge: 2017-09-16 | Disposition: A | Discharge status: Home / Self Care | Payer: Medicare Other | Source: Ambulatory Visit | Attending: Internal Medicine | Admitting: Internal Medicine

## 2017-09-16 VITALS — BP 138/68 | HR 62 | Wt 200.0 lb

## 2017-09-16 DIAGNOSIS — Z7901 Long term (current) use of anticoagulants: Secondary | ICD-10-CM | POA: Insufficient documentation

## 2017-09-16 DIAGNOSIS — Z818 Family history of other mental and behavioral disorders: Secondary | ICD-10-CM | POA: Insufficient documentation

## 2017-09-16 DIAGNOSIS — E875 Hyperkalemia: Secondary | ICD-10-CM | POA: Diagnosis not present

## 2017-09-16 DIAGNOSIS — G4733 Obstructive sleep apnea (adult) (pediatric): Secondary | ICD-10-CM | POA: Diagnosis not present

## 2017-09-16 DIAGNOSIS — I5042 Chronic combined systolic (congestive) and diastolic (congestive) heart failure: Secondary | ICD-10-CM | POA: Insufficient documentation

## 2017-09-16 DIAGNOSIS — Z79899 Other long term (current) drug therapy: Secondary | ICD-10-CM | POA: Insufficient documentation

## 2017-09-16 DIAGNOSIS — Z87442 Personal history of urinary calculi: Secondary | ICD-10-CM | POA: Insufficient documentation

## 2017-09-16 DIAGNOSIS — I272 Pulmonary hypertension, unspecified: Secondary | ICD-10-CM | POA: Insufficient documentation

## 2017-09-16 DIAGNOSIS — I2510 Atherosclerotic heart disease of native coronary artery without angina pectoris: Secondary | ICD-10-CM | POA: Insufficient documentation

## 2017-09-16 DIAGNOSIS — Z8673 Personal history of transient ischemic attack (TIA), and cerebral infarction without residual deficits: Secondary | ICD-10-CM | POA: Diagnosis not present

## 2017-09-16 DIAGNOSIS — I493 Ventricular premature depolarization: Secondary | ICD-10-CM | POA: Insufficient documentation

## 2017-09-16 DIAGNOSIS — Z8249 Family history of ischemic heart disease and other diseases of the circulatory system: Secondary | ICD-10-CM | POA: Diagnosis not present

## 2017-09-16 DIAGNOSIS — R42 Dizziness and giddiness: Secondary | ICD-10-CM | POA: Insufficient documentation

## 2017-09-16 DIAGNOSIS — I429 Cardiomyopathy, unspecified: Secondary | ICD-10-CM | POA: Insufficient documentation

## 2017-09-16 DIAGNOSIS — Z7982 Long term (current) use of aspirin: Secondary | ICD-10-CM | POA: Insufficient documentation

## 2017-09-16 DIAGNOSIS — E785 Hyperlipidemia, unspecified: Secondary | ICD-10-CM | POA: Diagnosis not present

## 2017-09-16 DIAGNOSIS — R0989 Other specified symptoms and signs involving the circulatory and respiratory systems: Secondary | ICD-10-CM | POA: Diagnosis not present

## 2017-09-16 DIAGNOSIS — Z823 Family history of stroke: Secondary | ICD-10-CM | POA: Diagnosis not present

## 2017-09-16 DIAGNOSIS — Z86718 Personal history of other venous thrombosis and embolism: Secondary | ICD-10-CM | POA: Diagnosis not present

## 2017-09-16 LAB — BASIC METABOLIC PANEL
Anion gap: 10 (ref 5–15)
BUN: 29 mg/dL — ABNORMAL HIGH (ref 6–20)
CALCIUM: 9.5 mg/dL (ref 8.9–10.3)
CHLORIDE: 99 mmol/L — AB (ref 101–111)
CO2: 25 mmol/L (ref 22–32)
Creatinine, Ser: 1.65 mg/dL — ABNORMAL HIGH (ref 0.61–1.24)
GFR calc non Af Amer: 38 mL/min — ABNORMAL LOW (ref >60)
GFR, EST AFRICAN AMERICAN: 44 mL/min — AB (ref >60)
Glucose, Bld: 115 mg/dL — ABNORMAL HIGH (ref 65–99)
Potassium: 4.4 mmol/L (ref 3.5–5.1)
Sodium: 134 mmol/L — ABNORMAL LOW (ref 135–145)

## 2017-09-16 LAB — DIGOXIN LEVEL: Digoxin Level: 0.5 ng/mL — ABNORMAL LOW (ref 0.8–2.0)

## 2017-09-16 MED ORDER — SPIRONOLACTONE 25 MG PO TABS: 12.5000 mg | ORAL_TABLET | Freq: Every day | ORAL | 6 refills | Status: DC

## 2017-09-16 NOTE — Patient Instructions (Signed)
Decrease spironolactone to 12.5 mg (1/2 tab) daily  Labs today   Your physician recommends that you schedule a follow-up appointment in: 6 weeks

## 2017-09-16 NOTE — Progress Notes (Signed)
PCP: Primary HF Cardiologist: Dr Haroldine Laws   HPI: Michael Knutzen Pegramis a 83 y.o.malewith past medical history of nonischemic cardiomyopathy, PVCs, DVT, MR, DMII, CVA,and left carotid bruit.   He had stopped all HF medications September 2018 and started feeling poorly in December 2018. Admitted 07/25/17 with marked volume overload and was diuresed IV lasix but had poor response. ECHO completed and showed EF 25% , RV mildly dilated, and grade II DD. Started on milrinone to support RV and diurese. Diuresed with IV lasix + metolazone and later transitioned to torsemide 40 mg daily. Renal function was followed closely with diuretics adjusted. He was not placed on ARB due to elevated creatinine. He was not placed on bb due to low output. He had PVCs prior to admit but with increasing frequency after starting milrinone so IV amio was started. Once milrinone was stopped he transitioned to amiodarone 200 mg twice daily. PVCs reduced after milrinone was stopped. He was on coumadin prior to admit for history of DVT. INR will continue to be managed in the community by Dr Virgina Jock. Discharged 08/05/2017-->207 pounds.   Readmitted 1/27 with AMS due to UTI. Treated with antibiotics and discharged the next day. Discharged on keflex x 5 days.   Struggling with urinary retention. Now has Foley in place. Started on proscar and uroxatral. Wife following BPs closely. SBP usually runs 100-110. Over past few days SBP running 85-100. Gets dizzy occasionally. No edeme. Gets around the house without trouble. Weight down 4 pounds to 194.   ECHO 07/2017 Mildly dilated LV with mild LV hypertrophy, EF 25%. Diffuse   hypokinesis with inferolateral akinesis. Moderate diastolic   dysfunction. Mildly dilated RV with moderately decreased systolic   function. Tethering of the posterior mitral leaflet with at least   moderate mitral regurgitation. Borderline pulmonary hypertension  ROS: All systems negative except as listed in HPI,  PMH and Problem List.  SH:  Social History   Socioeconomic History  . Marital status: Married    Spouse name: Not on file  . Number of children: 3  . Years of education: Associates  . Highest education level: Not on file  Social Needs  . Financial resource strain: Not on file  . Food insecurity - worry: Not on file  . Food insecurity - inability: Not on file  . Transportation needs - medical: Not on file  . Transportation needs - non-medical: Not on file  Occupational History  . Occupation: Retired   Tobacco Use  . Smoking status: Never Smoker  . Smokeless tobacco: Never Used  Substance and Sexual Activity  . Alcohol use: No    Alcohol/week: 0.0 oz  . Drug use: No  . Sexual activity: Yes  Other Topics Concern  . Not on file  Social History Narrative   Drinks 1-2 cups of coffee a day     FH:  Family History  Problem Relation Age of Onset  . Stroke Mother   . Hypertension Mother   . Heart attack Father   . Deep vein thrombosis Father   . Crohn's disease Brother   . Schizophrenia Sister     Past Medical History:  Diagnosis Date  . Congestive heart failure (CHF) ([place])   . Diabetes mellitus without complication (HCC)    no medications  . Diverticulosis   . DVT (deep venous thrombosis) ([place])   . Heart disease   . Hyperlipemia   . Kidney stone   . NICM (nonischemic cardiomyopathy) ([place])   . OSA (obstructive  sleep apnea)   . Stroke ([place])   . TIA (transient ischemic attack) 2008   was seen on CT   . Toe infection     Current Outpatient Medications  Medication Sig Dispense Refill  . alfuzosin (UROXATRAL) 10 MG 24 hr tablet Take 10 mg by mouth at bedtime.    Marland Kitchen amiodarone (PACERONE) 200 MG tablet Take 1 tablet (200 mg total) by mouth daily. 30 tablet 11  . aspirin 81 MG tablet Take 81 mg by mouth at bedtime.     . Cholecalciferol 2000 UNITS CAPS Take 2,000 Units by mouth 2 (two) times daily.     . digoxin (LANOXIN) 0.125 MG tablet Take 62.5 mcg by mouth daily.     . finasteride (PROSCAR) 5 MG tablet Take 5 mg by mouth daily.    Marland Kitchen GARLIC PO Take 1 tablet by mouth daily.    Marland Kitchen losartan (COZAAR) 25 MG tablet Take 0.5 tablets (12.5 mg total) by mouth daily. 90 tablet 3  . MAGNESIUM PO Take 1 tablet by mouth 2 (two) times daily.     . Methylsulfonylmethane (MSM) 1000 MG CAPS Take 1,000 mg by mouth daily.     . potassium chloride SA (K-DUR,KLOR-CON) 20 MEQ tablet Take 1 tablet (20 mEq total) by mouth daily. 30 tablet 11  . sodium chloride (OCEAN) 0.65 % SOLN nasal spray Place 1 spray into both nostrils as needed for congestion.    Marland Kitchen spironolactone (ALDACTONE) 25 MG tablet Take 1 tablet (25 mg total) by mouth daily. 30 tablet 6  . torsemide (DEMADEX) 20 MG tablet Take 1 tablet (20 mg total) by mouth daily. 30 tablet 11  . warfarin (COUMADIN) 5 MG tablet Take 2.5-5 mg by mouth See admin instructions. 5 mg by mouth at bedtime on Sun/Tues/Thurs/Sat and 2.5 mg on Mon/Wed/Fri     No current facility-administered medications for this encounter.     Vitals:   09/16/17 1437  BP: 138/68  Pulse: 62  SpO2: 97%  Weight: 200 lb (90.7 kg)    PHYSICAL EXAM: General:  Well appearing. No resp difficulty HEENT: normal Neck: supple. JVP 5-6 Carotids 2+ bilat; no bruits. No lymphadenopathy or thryomegaly appreciated. Cor: PMI nondisplaced. Regular rate & rhythm. No rubs, gallops or murmurs. Lungs: clear Abdomen: obese soft, nontender, nondistended. No hepatosplenomegaly. No bruits or masses. Good bowel sounds. Extremities: no cyanosis, clubbing, rash, edema Neuro: alert & orientedx3, cranial nerves grossly intact. moves all 4 extremities w/o difficulty. Affect pleasant    EKG:  NSR 1st degree heart block 66 bpm    ASSESSMENT & PLAN: 1. Chronic Combined Systolic Heart Failure due to NICM. Possibly due to PVCs. -Had [place] 2016 with nonobstructive CAD. No recent cath due to CKD  -ECHO 07/2017 EF 25% Grade II DD, Mod MR, Severe LAE. Plan to repeat ECHO in 3 months  after HF meds optimized and PVCs suppressed.  - hospitalized 1/19 with cardiogenic shock requiring milrinone - now improving NYHA II-early III - volume status ok. Continue torsemide 20 mg daily .  - Continue losartan 12.5 mg daily. BP too soft for Entresto - With low BP and recent hyperkalemia will cut spiro to 12.5 mg daily. His wife will continue to follow BP closely and call us next week. If SBP still < 100 stop spiro.  - HR too low for b-blocker - Check BMET and digoxin level today - repeat echo in 3 months to see if EF improves with PVC suppression   2. Frequent PVCs -  No PVCs on exam today. Continue amio .     3. Moderate MR Repeat ECHO in 3 months  4. L carotid bruit Carotid US 9/.2018. Repeat in 03/2018  5. H/O DVT -On long term anticoagulation.  -On coumadin. INR followed by PCP.  -He was instructed to follow for INR later this week.  - Consider switching to apixaban 2.5 bid per Amplify EXT trial  6. BPH/urinary retention - continue meds per urology - would avoid prostate surgery for now until HF improves.    Michael Bickers, MD  3:05 PM

## 2017-09-23 ENCOUNTER — Telehealth (HOSPITAL_COMMUNITY): Payer: Self-pay | Admitting: *Deleted

## 2017-09-23 NOTE — Telephone Encounter (Signed)
Looks good

## 2017-09-23 NOTE — Telephone Encounter (Signed)
Pt wife called to report pts weekly bp readings as requested by Dr.Bensimhon.   3/7- 103/61 3/8-108/64 3/9-107/68 3/10-97/57 3/11-102/57 3/12-103/61 3/13-101/64  Message routed to [place]

## 2017-09-24 DIAGNOSIS — R339 Retention of urine, unspecified: Secondary | ICD-10-CM | POA: Diagnosis not present

## 2017-10-06 DIAGNOSIS — N183 Chronic kidney disease, stage 3 (moderate): Secondary | ICD-10-CM | POA: Diagnosis not present

## 2017-10-06 DIAGNOSIS — Z7901 Long term (current) use of anticoagulants: Secondary | ICD-10-CM | POA: Diagnosis not present

## 2017-10-06 DIAGNOSIS — Z6832 Body mass index (BMI) 32.0-32.9, adult: Secondary | ICD-10-CM | POA: Diagnosis not present

## 2017-10-06 DIAGNOSIS — N1330 Unspecified hydronephrosis: Secondary | ICD-10-CM | POA: Diagnosis not present

## 2017-10-06 DIAGNOSIS — E119 Type 2 diabetes mellitus without complications: Secondary | ICD-10-CM | POA: Diagnosis not present

## 2017-10-06 DIAGNOSIS — D692 Other nonthrombocytopenic purpura: Secondary | ICD-10-CM | POA: Diagnosis not present

## 2017-10-06 DIAGNOSIS — I5042 Chronic combined systolic (congestive) and diastolic (congestive) heart failure: Secondary | ICD-10-CM | POA: Diagnosis not present

## 2017-10-06 DIAGNOSIS — I6990 Unspecified sequelae of unspecified cerebrovascular disease: Secondary | ICD-10-CM | POA: Diagnosis not present

## 2017-10-06 DIAGNOSIS — Z86718 Personal history of other venous thrombosis and embolism: Secondary | ICD-10-CM | POA: Diagnosis not present

## 2017-10-06 DIAGNOSIS — N320 Bladder-neck obstruction: Secondary | ICD-10-CM | POA: Diagnosis not present

## 2017-10-06 DIAGNOSIS — I472 Ventricular tachycardia: Secondary | ICD-10-CM | POA: Diagnosis not present

## 2017-10-06 DIAGNOSIS — I2721 Secondary pulmonary arterial hypertension: Secondary | ICD-10-CM | POA: Diagnosis not present

## 2017-10-15 DIAGNOSIS — Z7901 Long term (current) use of anticoagulants: Secondary | ICD-10-CM | POA: Diagnosis not present

## 2017-10-15 DIAGNOSIS — I509 Heart failure, unspecified: Secondary | ICD-10-CM | POA: Diagnosis not present

## 2017-10-15 DIAGNOSIS — I82403 Acute embolism and thrombosis of unspecified deep veins of lower extremity, bilateral: Secondary | ICD-10-CM | POA: Diagnosis not present

## 2017-10-27 NOTE — Progress Notes (Signed)
Advanced Heart Failure Clinic Note  Primary HF Cardiologist: Dr Haroldine Laws   HPI: Michael Randolph is a 83 y.o. malewith past medical history of nonischemic cardiomyopathy, PVCs, DVT, MR, DMII, CVA,and left carotid bruit.   He had stopped all HF medications September 2018 and started feeling poorly in December 2018. Admitted 07/25/17 with marked volume overload and was diuresed IV lasix but had poor response. ECHO completed and showed EF 25% , RV mildly dilated, and grade II DD. Started on milrinone to support RV and diurese. Diuresed with IV lasix + metolazone and later transitioned to torsemide 40 mg daily. Renal function was followed closely with diuretics adjusted. He was not placed on ARB due to elevated creatinine. He was not placed on bb due to low output. He had PVCs prior to admit but with increasing frequency after starting milrinone so IV amio was started. Once milrinone was stopped he transitioned to amiodarone 200 mg twice daily. PVCs reduced after milrinone was stopped. He was on coumadin prior to admit for history of DVT. INR will continue to be managed in the community by Dr Virgina Jock. Discharged 08/05/2017-->207 pounds.   Readmitted 1/27 with AMS due to UTI. Treated with antibiotics and discharged the next day. Discharged on keflex x 5 days.   He presents today for regular follow up. After last visit kept BP log.  Average systolic in 027-253 range. Feeling well overall. Has suffered from allergies and upper airway congestion as spring has started. He wasn't sure what was safe to take OTC.  Fluid has been stable.  BP running between 100-110 at home, consistently. Denies lightheadedness or dizziness. He keeps his salt strictly between 1400-1600 mg daily, but would like to increase. He denies SOB with mild to moderate exertion.   ECHO 07/2017 Mildly dilated LV with mild LV hypertrophy, EF 25%. Diffuse   hypokinesis with inferolateral akinesis. Moderate diastolic   dysfunction. Mildly  dilated RV with moderately decreased systolic   function. Tethering of the posterior mitral leaflet with at least   moderate mitral regurgitation. Borderline pulmonary hypertension  Review of systems complete and found to be negative unless listed in HPI.    SH:  Social History   Socioeconomic History  . Marital status: Married    Spouse name: Not on file  . Number of children: 3  . Years of education: Associates  . Highest education level: Not on file  Occupational History  . Occupation: Retired   Scientific laboratory technician  . Financial resource strain: Not on file  . Food insecurity:    Worry: Not on file    Inability: Not on file  . Transportation needs:    Medical: Not on file    Non-medical: Not on file  Tobacco Use  . Smoking status: Never Smoker  . Smokeless tobacco: Never Used  Substance and Sexual Activity  . Alcohol use: No    Alcohol/week: 0.0 oz  . Drug use: No  . Sexual activity: Yes  Lifestyle  . Physical activity:    Days per week: Not on file    Minutes per session: Not on file  . Stress: Not on file  Relationships  . Social connections:    Talks on phone: Not on file    Gets together: Not on file    Attends religious service: Not on file    Active member of club or organization: Not on file    Attends meetings of clubs or organizations: Not on file    Relationship status: Not  on file  . Intimate partner violence:    Fear of current or ex partner: Not on file    Emotionally abused: Not on file    Physically abused: Not on file    Forced sexual activity: Not on file  Other Topics Concern  . Not on file  Social History Narrative   Drinks 1-2 cups of coffee a day     FH:  Family History  Problem Relation Age of Onset  . Stroke Mother   . Hypertension Mother   . Heart attack Father   . Deep vein thrombosis Father   . Crohn's disease Brother   . Schizophrenia Sister     Past Medical History:  Diagnosis Date  . Congestive heart failure (CHF) ([place])     . Diabetes mellitus without complication (HCC)    no medications  . Diverticulosis   . DVT (deep venous thrombosis) ([place])   . Heart disease   . Hyperlipemia   . Kidney stone   . NICM (nonischemic cardiomyopathy) ([place])   . OSA (obstructive sleep apnea)   . Stroke ([place])   . TIA (transient ischemic attack) 2008   was seen on CT   . Toe infection     Current Outpatient Medications  Medication Sig Dispense Refill  . alfuzosin (UROXATRAL) 10 MG 24 hr tablet Take 10 mg by mouth at bedtime.    Marland Kitchen amiodarone (PACERONE) 200 MG tablet Take 1 tablet (200 mg total) by mouth daily. 30 tablet 11  . aspirin 81 MG tablet Take 81 mg by mouth at bedtime.     . Cholecalciferol 2000 UNITS CAPS Take 2,000 Units by mouth 2 (two) times daily.     . digoxin (LANOXIN) 0.125 MG tablet Take 62.5 mcg by mouth daily.    . finasteride (PROSCAR) 5 MG tablet Take 5 mg by mouth daily.    Marland Kitchen GARLIC PO Take 1 tablet by mouth daily.    Marland Kitchen losartan (COZAAR) 25 MG tablet Take 0.5 tablets (12.5 mg total) by mouth daily. 90 tablet 3  . MAGNESIUM PO Take 1 tablet by mouth 2 (two) times daily.     . Methylsulfonylmethane (MSM) 1000 MG CAPS Take 1,000 mg by mouth daily.     . potassium chloride SA (K-DUR,KLOR-CON) 20 MEQ tablet Take 1 tablet (20 mEq total) by mouth daily. 30 tablet 11  . sodium chloride (OCEAN) 0.65 % SOLN nasal spray Place 1 spray into both nostrils as needed for congestion.    Marland Kitchen spironolactone (ALDACTONE) 25 MG tablet Take 0.5 tablets (12.5 mg total) by mouth daily. 30 tablet 6  . torsemide (DEMADEX) 20 MG tablet Take 1 tablet (20 mg total) by mouth daily. 30 tablet 11  . warfarin (COUMADIN) 5 MG tablet Take 2.5-5 mg by mouth See admin instructions. 5 mg by mouth at bedtime on Sun/Tues/Thurs/Sat and 2.5 mg on Mon/Wed/Fri     No current facility-administered medications for this visit.     Vitals:   10/28/17 1425  BP: 138/76  Pulse: (!) 58  SpO2: 98%  Weight: 201 lb 9.6 oz (91.4 kg)   Wt Readings  from Last 3 Encounters:  10/28/17 201 lb 9.6 oz (91.4 kg)  09/16/17 200 lb (90.7 kg)  08/12/17 194 lb 6 oz (88.2 kg)    PHYSICAL EXAM: General: Well appearing. No resp difficulty. HEENT: Normal Neck: Supple. JVP 5-6. Carotids 2+ bilat; no bruits. No thyromegaly or nodule noted. Cor: PMI nondisplaced. Slightly brady with occasional ectopy, No M/G/R noted  Lungs: CTAB, normal effort. Abdomen: Soft, non-tender, non-distended, no HSM. No bruits or masses. +BS  Extremities: No cyanosis, clubbing, or rash. R and LLE no edema.  Neuro: Alert & orientedx3, cranial nerves grossly intact. moves all 4 extremities w/o difficulty. Affect pleasant   EKG:  NSR 1st degree heart block 66 bpm   ASSESSMENT & PLAN: 1. Chronic Combined Systolic Heart Failure due to NICM. Possibly due to PVCs. -Had [place] 2016 with nonobstructive CAD. No recent cath due to CKD  -ECHO 07/2017 EF 25% Grade II DD, Mod MR, Severe LAE. Plan to repeat ECHO in 3 months after HF meds optimized and PVCs suppressed.  - Hospitalized 1/19 with cardiogenic shock requiring milrinone - NYHA II-III symptoms - Volume status stable on exam.  - Continue torsemide 20 mg daily.  - Continue losartan 12.5 mg daily. BP too soft for Entresto - Continue spiro 12.5 mg daily. Up-titration has been limited by soft BP.  - HR too low for b-blocker - Check BMET and digoxin level today - Repeat Echo in June to see if EF improves with PVC suppression   2. Frequent PVCs  - None noted on recent EKG.  - Continue amiodarone  200 mg daily.  - Recheck Echo in June to see if suppression has improved EF.   3. Moderate MR - Repeat Echo in June.   4. L carotid bruit - Carotid US 9/.2018. Repeat in 03/2018  5. H/O DVT - Continue long term coumadin. INR followed by PCP.  - Could consider switching to apixaban 2.5 bid per Amplify EXT trial  6. BPH/urinary retention - Continue meds per urology - Would avoid prostate surgery for now until HF improves.   RTC  3 months. Echo in June. RTC sooner with any worsening symptoms.    Shirley Friar, PA-C  2:19 PM   Greater than 50% of the 25 minute visit was spent in counseling/coordination of care regarding disease state education, salt/fluid restriction, sliding scale diuretics, and medication compliance.

## 2017-10-28 ENCOUNTER — Encounter (HOSPITAL_COMMUNITY): Payer: Self-pay

## 2017-10-28 ENCOUNTER — Ambulatory Visit (HOSPITAL_COMMUNITY)
Admission: RE | Admit: 2017-10-28 | Discharge: 2017-10-28 | Disposition: A | Discharge status: Home / Self Care | Payer: Medicare Other | Source: Ambulatory Visit | Attending: Internal Medicine | Admitting: Internal Medicine

## 2017-10-28 ENCOUNTER — Telehealth (HOSPITAL_COMMUNITY): Payer: Self-pay | Admitting: Student

## 2017-10-28 VITALS — BP 138/76 | HR 58 | Wt 201.6 lb

## 2017-10-28 DIAGNOSIS — R0989 Other specified symptoms and signs involving the circulatory and respiratory systems: Secondary | ICD-10-CM | POA: Insufficient documentation

## 2017-10-28 DIAGNOSIS — Z79899 Other long term (current) drug therapy: Secondary | ICD-10-CM | POA: Insufficient documentation

## 2017-10-28 DIAGNOSIS — I5023 Acute on chronic systolic (congestive) heart failure: Secondary | ICD-10-CM | POA: Insufficient documentation

## 2017-10-28 DIAGNOSIS — E785 Hyperlipidemia, unspecified: Secondary | ICD-10-CM | POA: Diagnosis not present

## 2017-10-28 DIAGNOSIS — E782 Mixed hyperlipidemia: Secondary | ICD-10-CM

## 2017-10-28 DIAGNOSIS — I2510 Atherosclerotic heart disease of native coronary artery without angina pectoris: Secondary | ICD-10-CM | POA: Insufficient documentation

## 2017-10-28 DIAGNOSIS — I429 Cardiomyopathy, unspecified: Secondary | ICD-10-CM | POA: Insufficient documentation

## 2017-10-28 DIAGNOSIS — I509 Heart failure, unspecified: Secondary | ICD-10-CM | POA: Diagnosis present

## 2017-10-28 DIAGNOSIS — Z87442 Personal history of urinary calculi: Secondary | ICD-10-CM | POA: Insufficient documentation

## 2017-10-28 DIAGNOSIS — I340 Nonrheumatic mitral (valve) insufficiency: Secondary | ICD-10-CM

## 2017-10-28 DIAGNOSIS — Z87440 Personal history of urinary (tract) infections: Secondary | ICD-10-CM | POA: Diagnosis not present

## 2017-10-28 DIAGNOSIS — I272 Pulmonary hypertension, unspecified: Secondary | ICD-10-CM | POA: Diagnosis not present

## 2017-10-28 DIAGNOSIS — Z8673 Personal history of transient ischemic attack (TIA), and cerebral infarction without residual deficits: Secondary | ICD-10-CM | POA: Diagnosis not present

## 2017-10-28 DIAGNOSIS — Z86718 Personal history of other venous thrombosis and embolism: Secondary | ICD-10-CM | POA: Insufficient documentation

## 2017-10-28 DIAGNOSIS — R4182 Altered mental status, unspecified: Secondary | ICD-10-CM | POA: Insufficient documentation

## 2017-10-28 DIAGNOSIS — G4733 Obstructive sleep apnea (adult) (pediatric): Secondary | ICD-10-CM | POA: Insufficient documentation

## 2017-10-28 DIAGNOSIS — I5042 Chronic combined systolic (congestive) and diastolic (congestive) heart failure: Secondary | ICD-10-CM | POA: Diagnosis not present

## 2017-10-28 DIAGNOSIS — Z7982 Long term (current) use of aspirin: Secondary | ICD-10-CM | POA: Diagnosis not present

## 2017-10-28 DIAGNOSIS — I493 Ventricular premature depolarization: Secondary | ICD-10-CM | POA: Insufficient documentation

## 2017-10-28 DIAGNOSIS — Z7901 Long term (current) use of anticoagulants: Secondary | ICD-10-CM | POA: Diagnosis not present

## 2017-10-28 LAB — BASIC METABOLIC PANEL
ANION GAP: 9 (ref 5–15)
BUN: 29 mg/dL — ABNORMAL HIGH (ref 6–20)
CALCIUM: 9.3 mg/dL (ref 8.9–10.3)
CO2: 25 mmol/L (ref 22–32)
Chloride: 101 mmol/L (ref 101–111)
Creatinine, Ser: 1.64 mg/dL — ABNORMAL HIGH (ref 0.61–1.24)
GFR, EST AFRICAN AMERICAN: 44 mL/min — AB (ref >60)
GFR, EST NON AFRICAN AMERICAN: 38 mL/min — AB (ref >60)
Glucose, Bld: 160 mg/dL — ABNORMAL HIGH (ref 65–99)
POTASSIUM: 5.2 mmol/L — AB (ref 3.5–5.1)
SODIUM: 135 mmol/L (ref 135–145)

## 2017-10-28 NOTE — Patient Instructions (Signed)
Routine lab work today. Will notify you of abnormal results, otherwise no news is good news!  Will schedule you for an echocardiogram at [facility]. Address: [address] ([place]), [place], [place] [address]  Phone: [phone]  Follow up with Dr. Haroldine Laws in 3 months.  Take all medication as prescribed the day of your appointment. Bring all medications with you to your appointment.  Do the following things EVERYDAY: 1) Weigh yourself in the morning before breakfast. Write it down and keep it in a log. 2) Take your medicines as prescribed 3) Eat low salt foods-Limit salt (sodium) to 2000 mg per day.  4) Stay as active as you can everyday 5) Limit all fluids for the day to less than 2 liters

## 2017-10-29 NOTE — Telephone Encounter (Signed)
User: Cherie Dark A Date/time: 10/28/17 3:45 PM  Comment: Called pt and lmsg for him to CB to get sch for an echo in June.Vassie Moment  Context:  Outcome: Left Message  Phone number: [phone] Phone Type: Mobile  Comm. type: Telephone Call type: Outgoing  Contact: [name] D Relation to patient: Self

## 2017-10-30 ENCOUNTER — Telehealth (HOSPITAL_COMMUNITY): Payer: Self-pay | Admitting: Cardiology

## 2017-10-30 NOTE — Telephone Encounter (Signed)
-----   Message from Shirley Friar, PA-C sent at 10/29/2017 10:49 AM EDT ----- Stop daily K.    Legrand [place] [address]" [place], PA-C 10/29/2017 10:49 AM

## 2017-10-30 NOTE — Telephone Encounter (Signed)
Pt aware.

## 2017-11-09 DIAGNOSIS — R339 Retention of urine, unspecified: Secondary | ICD-10-CM | POA: Diagnosis not present

## 2017-11-18 DIAGNOSIS — I82403 Acute embolism and thrombosis of unspecified deep veins of lower extremity, bilateral: Secondary | ICD-10-CM | POA: Diagnosis not present

## 2017-11-18 DIAGNOSIS — Z7901 Long term (current) use of anticoagulants: Secondary | ICD-10-CM | POA: Diagnosis not present

## 2017-12-23 DIAGNOSIS — I82403 Acute embolism and thrombosis of unspecified deep veins of lower extremity, bilateral: Secondary | ICD-10-CM | POA: Diagnosis not present

## 2017-12-23 DIAGNOSIS — Z7901 Long term (current) use of anticoagulants: Secondary | ICD-10-CM | POA: Diagnosis not present

## 2018-01-07 ENCOUNTER — Other Ambulatory Visit: Payer: Self-pay

## 2018-01-07 ENCOUNTER — Ambulatory Visit (HOSPITAL_COMMUNITY): Payer: Medicare Other | Attending: Internal Medicine

## 2018-01-07 DIAGNOSIS — G4733 Obstructive sleep apnea (adult) (pediatric): Secondary | ICD-10-CM | POA: Insufficient documentation

## 2018-01-07 DIAGNOSIS — I5042 Chronic combined systolic (congestive) and diastolic (congestive) heart failure: Secondary | ICD-10-CM

## 2018-01-07 DIAGNOSIS — Z86718 Personal history of other venous thrombosis and embolism: Secondary | ICD-10-CM | POA: Insufficient documentation

## 2018-01-07 DIAGNOSIS — I429 Cardiomyopathy, unspecified: Secondary | ICD-10-CM | POA: Insufficient documentation

## 2018-01-07 DIAGNOSIS — Z8673 Personal history of transient ischemic attack (TIA), and cerebral infarction without residual deficits: Secondary | ICD-10-CM | POA: Diagnosis not present

## 2018-01-07 DIAGNOSIS — E785 Hyperlipidemia, unspecified: Secondary | ICD-10-CM | POA: Insufficient documentation

## 2018-01-07 DIAGNOSIS — I110 Hypertensive heart disease with heart failure: Secondary | ICD-10-CM | POA: Insufficient documentation

## 2018-01-07 MED ORDER — PERFLUTREN LIPID MICROSPHERE
1.0000 mL | INTRAVENOUS | Status: AC | PRN
  Administered 2018-01-07: 2 mL via INTRAVENOUS

## 2018-01-15 ENCOUNTER — Telehealth (HOSPITAL_COMMUNITY): Payer: Self-pay

## 2018-01-15 NOTE — Telephone Encounter (Signed)
Result Notes for ECHOCARDIOGRAM COMPLETE   Notes recorded by Effie Berkshire, RN on 01/15/2018 at 11:50 AM EDT Detailed results left on patient's confidential cell phone voicemail. Reminded of upcoming appt and call back number left for patient to return call if necessary. ------  Notes recorded by Shirley Friar, PA-C on 01/08/2018 at 9:40 AM EDT EF improved to 35-40%. MR also improved and read as trivial.

## 2018-01-28 DIAGNOSIS — I82403 Acute embolism and thrombosis of unspecified deep veins of lower extremity, bilateral: Secondary | ICD-10-CM | POA: Diagnosis not present

## 2018-01-28 DIAGNOSIS — Z7901 Long term (current) use of anticoagulants: Secondary | ICD-10-CM | POA: Diagnosis not present

## 2018-02-01 ENCOUNTER — Other Ambulatory Visit: Payer: Self-pay

## 2018-02-01 ENCOUNTER — Encounter (HOSPITAL_COMMUNITY): Payer: Self-pay | Admitting: Internal Medicine

## 2018-02-01 ENCOUNTER — Ambulatory Visit (HOSPITAL_COMMUNITY)
Admission: RE | Admit: 2018-02-01 | Discharge: 2018-02-01 | Disposition: A | Discharge status: Home / Self Care | Payer: Medicare Other | Source: Ambulatory Visit | Attending: Internal Medicine | Admitting: Internal Medicine

## 2018-02-01 VITALS — BP 135/72 | HR 64 | Wt 208.5 lb

## 2018-02-01 DIAGNOSIS — I428 Other cardiomyopathies: Secondary | ICD-10-CM | POA: Diagnosis not present

## 2018-02-01 DIAGNOSIS — Z79899 Other long term (current) drug therapy: Secondary | ICD-10-CM | POA: Insufficient documentation

## 2018-02-01 DIAGNOSIS — N401 Enlarged prostate with lower urinary tract symptoms: Secondary | ICD-10-CM | POA: Insufficient documentation

## 2018-02-01 DIAGNOSIS — I493 Ventricular premature depolarization: Secondary | ICD-10-CM | POA: Insufficient documentation

## 2018-02-01 DIAGNOSIS — E785 Hyperlipidemia, unspecified: Secondary | ICD-10-CM | POA: Diagnosis not present

## 2018-02-01 DIAGNOSIS — I2510 Atherosclerotic heart disease of native coronary artery without angina pectoris: Secondary | ICD-10-CM | POA: Insufficient documentation

## 2018-02-01 DIAGNOSIS — Z7982 Long term (current) use of aspirin: Secondary | ICD-10-CM | POA: Diagnosis not present

## 2018-02-01 DIAGNOSIS — Z8673 Personal history of transient ischemic attack (TIA), and cerebral infarction without residual deficits: Secondary | ICD-10-CM | POA: Diagnosis not present

## 2018-02-01 DIAGNOSIS — I440 Atrioventricular block, first degree: Secondary | ICD-10-CM | POA: Diagnosis not present

## 2018-02-01 DIAGNOSIS — Z86718 Personal history of other venous thrombosis and embolism: Secondary | ICD-10-CM | POA: Insufficient documentation

## 2018-02-01 DIAGNOSIS — G4733 Obstructive sleep apnea (adult) (pediatric): Secondary | ICD-10-CM | POA: Insufficient documentation

## 2018-02-01 DIAGNOSIS — I454 Nonspecific intraventricular block: Secondary | ICD-10-CM | POA: Diagnosis not present

## 2018-02-01 DIAGNOSIS — Z8249 Family history of ischemic heart disease and other diseases of the circulatory system: Secondary | ICD-10-CM | POA: Diagnosis not present

## 2018-02-01 DIAGNOSIS — R338 Other retention of urine: Secondary | ICD-10-CM | POA: Insufficient documentation

## 2018-02-01 DIAGNOSIS — E119 Type 2 diabetes mellitus without complications: Secondary | ICD-10-CM | POA: Diagnosis not present

## 2018-02-01 DIAGNOSIS — R0989 Other specified symptoms and signs involving the circulatory and respiratory systems: Secondary | ICD-10-CM | POA: Diagnosis not present

## 2018-02-01 DIAGNOSIS — Z7901 Long term (current) use of anticoagulants: Secondary | ICD-10-CM | POA: Insufficient documentation

## 2018-02-01 DIAGNOSIS — I5042 Chronic combined systolic (congestive) and diastolic (congestive) heart failure: Secondary | ICD-10-CM

## 2018-02-01 DIAGNOSIS — R001 Bradycardia, unspecified: Secondary | ICD-10-CM

## 2018-02-01 DIAGNOSIS — I340 Nonrheumatic mitral (valve) insufficiency: Secondary | ICD-10-CM | POA: Insufficient documentation

## 2018-02-01 DIAGNOSIS — R42 Dizziness and giddiness: Secondary | ICD-10-CM | POA: Diagnosis present

## 2018-02-01 LAB — COMPREHENSIVE METABOLIC PANEL
ALT: 23 U/L (ref 0–44)
AST: 26 U/L (ref 15–41)
Albumin: 3.8 g/dL (ref 3.5–5.0)
Alkaline Phosphatase: 57 U/L (ref 38–126)
Anion gap: 9 (ref 5–15)
BUN: 21 mg/dL (ref 8–23)
CHLORIDE: 98 mmol/L (ref 98–111)
CO2: 30 mmol/L (ref 22–32)
CREATININE: 1.49 mg/dL — AB (ref 0.61–1.24)
Calcium: 9.5 mg/dL (ref 8.9–10.3)
GFR calc Af Amer: 49 mL/min — ABNORMAL LOW (ref >60)
GFR, EST NON AFRICAN AMERICAN: 42 mL/min — AB (ref >60)
Glucose, Bld: 135 mg/dL — ABNORMAL HIGH (ref 70–99)
Potassium: 4.6 mmol/L (ref 3.5–5.1)
Sodium: 137 mmol/L (ref 135–145)
Total Bilirubin: 0.6 mg/dL (ref 0.3–1.2)
Total Protein: 6.8 g/dL (ref 6.5–8.1)

## 2018-02-01 LAB — T4, FREE: Free T4: 0.83 ng/dL (ref 0.82–1.77)

## 2018-02-01 LAB — TSH: TSH: 12.252 u[IU]/mL — AB (ref 0.350–4.500)

## 2018-02-01 MED ORDER — AMIODARONE HCL 200 MG PO TABS: 100.0000 mg | ORAL_TABLET | Freq: Every day | ORAL | 11 refills | Status: DC

## 2018-02-01 NOTE — Patient Instructions (Addendum)
STOP Aspirin.  STOP Digoxin.  DECREASE Amiodarone to 100mg  daily.  Routine lab work today. Will notify you of abnormal results  Follow up with Dr.Bensimhon in 3-4 months.

## 2018-02-01 NOTE — Progress Notes (Signed)
Advanced Heart Failure Clinic Note  Primary HF Cardiologist: Dr Haroldine Laws   HPI: Michael Randolph is a 83 y.o. malewith past medical history of nonischemic cardiomyopathy, PVCs, DVT, MR, DMII, CVA,and left carotid bruit.   He had stopped all HF medications September 2018 and started feeling poorly in December 2018. Admitted 07/25/17 with marked volume overload and was diuresed IV lasix but had poor response. ECHO completed and showed EF 25% , RV mildly dilated, and grade II DD. Started on milrinone to support RV and diurese. Diuresed with IV lasix + metolazone and later transitioned to torsemide 40 mg daily. Renal function was followed closely with diuretics adjusted. He was not placed on ARB due to elevated creatinine. He was not placed on bb due to low output. He had PVCs prior to admit but with increasing frequency after starting milrinone so IV amio was started. Once milrinone was stopped he transitioned to amiodarone 200 mg twice daily. PVCs reduced after milrinone was stopped. He was on coumadin prior to admit for history of DVT. INR will continue to be managed in the community by Dr Virgina Jock. Discharged 08/05/2017-->207 pounds.   Readmitted 1/27 with AMS due to UTI. Treated with antibiotics and discharged the next day. Discharged on keflex x 5 days.   He presents today for regular follow up. Says he feels pretty good. When he gets up in the morning he feels lightheaded but this goes away quickly. Able to do everything he wants to around the house. Can use weed eater and walk around without problem. Can walk up a flight of steps. SBP 100-115 HRs in 40-50s. Weight stable. No orthopnea or PND.  Still straight cathing 3 times per day.   Echo 6/19 - EF 35-40% mild AS    ECHO 07/2017 Mildly dilated LV with mild LV hypertrophy, EF 25%. Diffuse   hypokinesis with inferolateral akinesis. Moderate diastolic   dysfunction. Mildly dilated RV with moderately decreased systolic   function. Tethering of  the posterior mitral leaflet with at least   moderate mitral regurgitation. Borderline pulmonary hypertension  Review of systems complete and found to be negative unless listed in HPI.    SH:  Social History   Socioeconomic History  . Marital status: Married    Spouse name: Not on file  . Number of children: 3  . Years of education: Associates  . Highest education level: Not on file  Occupational History  . Occupation: Retired   Scientific laboratory technician  . Financial resource strain: Not on file  . Food insecurity:    Worry: Not on file    Inability: Not on file  . Transportation needs:    Medical: Not on file    Non-medical: Not on file  Tobacco Use  . Smoking status: Never Smoker  . Smokeless tobacco: Never Used  Substance and Sexual Activity  . Alcohol use: No    Alcohol/week: 0.0 oz  . Drug use: No  . Sexual activity: Yes  Lifestyle  . Physical activity:    Days per week: Not on file    Minutes per session: Not on file  . Stress: Not on file  Relationships  . Social connections:    Talks on phone: Not on file    Gets together: Not on file    Attends religious service: Not on file    Active member of club or organization: Not on file    Attends meetings of clubs or organizations: Not on file    Relationship status: Not  on file  . Intimate partner violence:    Fear of current or ex partner: Not on file    Emotionally abused: Not on file    Physically abused: Not on file    Forced sexual activity: Not on file  Other Topics Concern  . Not on file  Social History Narrative   Drinks 1-2 cups of coffee a day     FH:  Family History  Problem Relation Age of Onset  . Stroke Mother   . Hypertension Mother   . Heart attack Father   . Deep vein thrombosis Father   . Crohn's disease Brother   . Schizophrenia Sister     Past Medical History:  Diagnosis Date  . Congestive heart failure (CHF) ([place])   . Diabetes mellitus without complication (HCC)    no medications  .  Diverticulosis   . DVT (deep venous thrombosis) ([place])   . Heart disease   . Hyperlipemia   . Kidney stone   . NICM (nonischemic cardiomyopathy) ([place])   . OSA (obstructive sleep apnea)   . Stroke ([place])   . TIA (transient ischemic attack) 2008   was seen on CT   . Toe infection     Current Outpatient Medications  Medication Sig Dispense Refill  . alfuzosin (UROXATRAL) 10 MG 24 hr tablet Take 10 mg by mouth at bedtime.    Marland Kitchen amiodarone (PACERONE) 200 MG tablet Take 1 tablet (200 mg total) by mouth daily. 30 tablet 11  . aspirin 81 MG tablet Take 81 mg by mouth at bedtime.     . Cholecalciferol 2000 UNITS CAPS Take 2,000 Units by mouth 2 (two) times daily.     . digoxin (LANOXIN) 0.125 MG tablet Take 62.5 mcg by mouth daily.    . finasteride (PROSCAR) 5 MG tablet Take 5 mg by mouth daily.    Marland Kitchen GARLIC PO Take 1 tablet by mouth daily.    Marland Kitchen losartan (COZAAR) 25 MG tablet Take 0.5 tablets (12.5 mg total) by mouth daily. 90 tablet 3  . MAGNESIUM PO Take 1 tablet by mouth 2 (two) times daily.     . Methylsulfonylmethane (MSM) 1000 MG CAPS Take 1,000 mg by mouth daily.     . Probiotic Product (PROBIOTIC-10 ULTIMATE PO) Take 1 mg by mouth daily.    . sodium chloride (OCEAN) 0.65 % SOLN nasal spray Place 1 spray into both nostrils as needed for congestion.    Marland Kitchen spironolactone (ALDACTONE) 25 MG tablet Take 0.5 tablets (12.5 mg total) by mouth daily. 30 tablet 6  . torsemide (DEMADEX) 20 MG tablet Take 1 tablet (20 mg total) by mouth daily. 30 tablet 11  . warfarin (COUMADIN) 5 MG tablet Take 2.5-5 mg by mouth See admin instructions. 5 mg by mouth at bedtime on Sun/Tues/Thurs/Sat and 2.5 mg on Mon/Wed/Fri     No current facility-administered medications for this encounter.     Vitals:   02/01/18 1338  BP: 135/72  Pulse: 64  SpO2: 99%  Weight: 208 lb 8 oz (94.6 kg)   Wt Readings from Last 3 Encounters:  02/01/18 208 lb 8 oz (94.6 kg)  10/28/17 201 lb 9.6 oz (91.4 kg)  09/16/17 200 lb (90.7  kg)    PHYSICAL EXAM: General:  Elderly Well appearing. No resp difficulty HEENT: normal Neck: supple. no JVD. Carotids 2+ bilat; no bruits. No lymphadenopathy or thryomegaly appreciated. Cor: PMI nondisplaced. Brady regular 2/6 AS  No MR Lungs: clear Abdomen: soft, nontender, nondistended. No  hepatosplenomegaly. No bruits or masses. Good bowel sounds. Extremities: no cyanosis, clubbing, rash, edema Neuro: alert & orientedx3, cranial nerves grossly intact. moves all 4 extremities w/o difficulty. Affect pleasant   EKG:  Sinus brady 51 1st degree heart block (243ms) Personally reviewed   ASSESSMENT & PLAN: 1. Chronic Combined Systolic Heart Failure due to NICM. Possibly due to PVCs. -Had [place] 2016 with nonobstructive CAD. No recent cath due to CKD  - ECHO 07/2017 EF 25% Grade II DD, Mod MR, Severe LAE. - Hospitalized 1/19 with cardiogenic shock requiring milrinone - Echo 5/19 EF 35-40% -> EF improved with PVC suppression - Improved NYHA II symptoms  - Volume status stable on exam.  - Continue torsemide 20 mg daily.  - Continue losartan 12.5 mg daily. BP too soft for Entresto - Continue spiro 12.5 mg daily. Up-titration has been limited by soft BP.  - HR too low for b-blocker - Stop digoxin with severe bradycardia  - Repeat Echo in June to see if EF improves with PVC suppression   2. Frequent PVCs  - Suppressed with amiodarone but now very bradycardic  - Stop digoxin. Decrease amio to 100 daily   3. Moderate MR - Echo in 6/19 with trivial MR  4. L carotid bruit - Carotid US 03/2017. Repeat in 03/2018  5. H/O DVT - Continue long term coumadin. INR followed by PCP.  - Could consider switching to apixaban 2.5 bid per Amplify EXT trial  6. BPH/urinary retention - Continue meds per urology - Ok to proceed with prostate surgery as needed.     Glori Bickers, MD  1:56 PM

## 2018-02-02 ENCOUNTER — Telehealth (HOSPITAL_COMMUNITY): Payer: Self-pay | Admitting: *Deleted

## 2018-02-02 DIAGNOSIS — I5042 Chronic combined systolic (congestive) and diastolic (congestive) heart failure: Secondary | ICD-10-CM

## 2018-02-02 DIAGNOSIS — R7989 Other specified abnormal findings of blood chemistry: Secondary | ICD-10-CM

## 2018-02-02 NOTE — Telephone Encounter (Signed)
Notes recorded by Scarlette Calico, RN on 02/02/2018 at 4:52 PM EDT Pt and wife are aware and agreeable, repeat lab sch for 8/27

## 2018-02-02 NOTE — Telephone Encounter (Signed)
-----   Message from Jolaine Artist, MD sent at 02/01/2018 10:15 PM EDT ----- TSH is up and T4 on low end. Repeat in 4 weeks. May need synthroid.

## 2018-03-04 DIAGNOSIS — Z7901 Long term (current) use of anticoagulants: Secondary | ICD-10-CM | POA: Diagnosis not present

## 2018-03-04 DIAGNOSIS — I82403 Acute embolism and thrombosis of unspecified deep veins of lower extremity, bilateral: Secondary | ICD-10-CM | POA: Diagnosis not present

## 2018-03-09 ENCOUNTER — Ambulatory Visit (HOSPITAL_COMMUNITY)
Admission: RE | Admit: 2018-03-09 | Discharge: 2018-03-09 | Disposition: A | Discharge status: Home / Self Care | Payer: Medicare Other | Source: Ambulatory Visit | Attending: Cardiology | Admitting: Cardiology

## 2018-03-09 DIAGNOSIS — R7989 Other specified abnormal findings of blood chemistry: Secondary | ICD-10-CM | POA: Insufficient documentation

## 2018-03-09 DIAGNOSIS — I5042 Chronic combined systolic (congestive) and diastolic (congestive) heart failure: Secondary | ICD-10-CM

## 2018-03-09 LAB — T4, FREE: FREE T4: 0.62 ng/dL — AB (ref 0.82–1.77)

## 2018-03-09 LAB — TSH: TSH: 10.609 u[IU]/mL — AB (ref 0.350–4.500)

## 2018-03-10 LAB — T3, FREE: T3, Free: 2.8 pg/mL (ref 2.0–4.4)

## 2018-03-24 DIAGNOSIS — E7849 Other hyperlipidemia: Secondary | ICD-10-CM | POA: Diagnosis not present

## 2018-03-24 DIAGNOSIS — E119 Type 2 diabetes mellitus without complications: Secondary | ICD-10-CM | POA: Diagnosis not present

## 2018-03-24 DIAGNOSIS — Z125 Encounter for screening for malignant neoplasm of prostate: Secondary | ICD-10-CM | POA: Diagnosis not present

## 2018-03-24 DIAGNOSIS — R82998 Other abnormal findings in urine: Secondary | ICD-10-CM | POA: Diagnosis not present

## 2018-03-30 DIAGNOSIS — Z1389 Encounter for screening for other disorder: Secondary | ICD-10-CM | POA: Diagnosis not present

## 2018-03-30 DIAGNOSIS — I2510 Atherosclerotic heart disease of native coronary artery without angina pectoris: Secondary | ICD-10-CM | POA: Diagnosis not present

## 2018-03-30 DIAGNOSIS — D692 Other nonthrombocytopenic purpura: Secondary | ICD-10-CM | POA: Diagnosis not present

## 2018-03-30 DIAGNOSIS — Z0000 Encounter for general adult medical examination without abnormal findings: Secondary | ICD-10-CM | POA: Diagnosis not present

## 2018-03-30 DIAGNOSIS — N320 Bladder-neck obstruction: Secondary | ICD-10-CM | POA: Diagnosis not present

## 2018-03-30 DIAGNOSIS — I472 Ventricular tachycardia: Secondary | ICD-10-CM | POA: Diagnosis not present

## 2018-03-30 DIAGNOSIS — E119 Type 2 diabetes mellitus without complications: Secondary | ICD-10-CM | POA: Diagnosis not present

## 2018-03-30 DIAGNOSIS — I5042 Chronic combined systolic (congestive) and diastolic (congestive) heart failure: Secondary | ICD-10-CM | POA: Diagnosis not present

## 2018-03-30 DIAGNOSIS — I509 Heart failure, unspecified: Secondary | ICD-10-CM | POA: Diagnosis not present

## 2018-03-30 DIAGNOSIS — I2721 Secondary pulmonary arterial hypertension: Secondary | ICD-10-CM | POA: Diagnosis not present

## 2018-03-30 DIAGNOSIS — Z6833 Body mass index (BMI) 33.0-33.9, adult: Secondary | ICD-10-CM | POA: Diagnosis not present

## 2018-03-30 DIAGNOSIS — N183 Chronic kidney disease, stage 3 (moderate): Secondary | ICD-10-CM | POA: Diagnosis not present

## 2018-03-31 DIAGNOSIS — Z1212 Encounter for screening for malignant neoplasm of rectum: Secondary | ICD-10-CM | POA: Diagnosis not present

## 2018-04-22 DIAGNOSIS — Z23 Encounter for immunization: Secondary | ICD-10-CM | POA: Diagnosis not present

## 2018-04-28 DIAGNOSIS — I82403 Acute embolism and thrombosis of unspecified deep veins of lower extremity, bilateral: Secondary | ICD-10-CM | POA: Diagnosis not present

## 2018-04-28 DIAGNOSIS — Z7901 Long term (current) use of anticoagulants: Secondary | ICD-10-CM | POA: Diagnosis not present

## 2018-05-19 ENCOUNTER — Encounter (HOSPITAL_COMMUNITY): Payer: Self-pay | Admitting: Internal Medicine

## 2018-05-19 ENCOUNTER — Ambulatory Visit (HOSPITAL_COMMUNITY)
Admission: RE | Admit: 2018-05-19 | Discharge: 2018-05-19 | Disposition: A | Discharge status: Home / Self Care | Payer: Medicare Other | Source: Ambulatory Visit | Attending: Internal Medicine | Admitting: Internal Medicine

## 2018-05-19 ENCOUNTER — Other Ambulatory Visit: Payer: Self-pay

## 2018-05-19 VITALS — BP 124/55 | HR 55 | Wt 210.2 lb

## 2018-05-19 DIAGNOSIS — G4733 Obstructive sleep apnea (adult) (pediatric): Secondary | ICD-10-CM | POA: Insufficient documentation

## 2018-05-19 DIAGNOSIS — R0989 Other specified symptoms and signs involving the circulatory and respiratory systems: Secondary | ICD-10-CM | POA: Diagnosis not present

## 2018-05-19 DIAGNOSIS — Z7901 Long term (current) use of anticoagulants: Secondary | ICD-10-CM | POA: Insufficient documentation

## 2018-05-19 DIAGNOSIS — E782 Mixed hyperlipidemia: Secondary | ICD-10-CM

## 2018-05-19 DIAGNOSIS — I82509 Chronic embolism and thrombosis of unspecified deep veins of unspecified lower extremity: Secondary | ICD-10-CM | POA: Diagnosis not present

## 2018-05-19 DIAGNOSIS — Z8673 Personal history of transient ischemic attack (TIA), and cerebral infarction without residual deficits: Secondary | ICD-10-CM | POA: Diagnosis not present

## 2018-05-19 DIAGNOSIS — N401 Enlarged prostate with lower urinary tract symptoms: Secondary | ICD-10-CM | POA: Diagnosis not present

## 2018-05-19 DIAGNOSIS — Z86718 Personal history of other venous thrombosis and embolism: Secondary | ICD-10-CM | POA: Insufficient documentation

## 2018-05-19 DIAGNOSIS — E785 Hyperlipidemia, unspecified: Secondary | ICD-10-CM | POA: Insufficient documentation

## 2018-05-19 DIAGNOSIS — R338 Other retention of urine: Secondary | ICD-10-CM | POA: Diagnosis not present

## 2018-05-19 DIAGNOSIS — I5042 Chronic combined systolic (congestive) and diastolic (congestive) heart failure: Secondary | ICD-10-CM | POA: Diagnosis not present

## 2018-05-19 DIAGNOSIS — Z8249 Family history of ischemic heart disease and other diseases of the circulatory system: Secondary | ICD-10-CM | POA: Insufficient documentation

## 2018-05-19 DIAGNOSIS — I493 Ventricular premature depolarization: Secondary | ICD-10-CM | POA: Insufficient documentation

## 2018-05-19 DIAGNOSIS — I428 Other cardiomyopathies: Secondary | ICD-10-CM | POA: Diagnosis not present

## 2018-05-19 NOTE — Progress Notes (Signed)
Advanced Heart Failure Clinic Note  Primary HF Cardiologist: Dr Haroldine Laws   HPI: Michael Randolph is a 83 y.o. malewith past medical history of nonischemic cardiomyopathy, PVCs, DVT, MR, DMII, CVA,and left carotid bruit.   He had stopped all HF medications September 2018 and started feeling poorly in December 2018. Admitted 07/25/17 with marked volume overload and was diuresed IV lasix but had poor response. ECHO completed and showed EF 25% , RV mildly dilated, and grade II DD. Started on milrinone to support RV and diurese. Diuresed with IV lasix + metolazone and later transitioned to torsemide 40 mg daily. Renal function was followed closely with diuretics adjusted. He was not placed on ARB due to elevated creatinine. He was not placed on bb due to low output. He had PVCs prior to admit but with increasing frequency after starting milrinone so IV amio was started. Once milrinone was stopped he transitioned to amiodarone 200 mg twice daily. PVCs reduced after milrinone was stopped. He was on coumadin prior to admit for history of DVT. INR will continue to be managed in the community by Dr Virgina Jock. Discharged 08/05/2017-->207 pounds.   Readmitted 1/27 with AMS due to UTI. Treated with antibiotics and discharged the next day. Discharged on keflex x 5 days.   He presents today for regular follow up. He is here with his wife. Says he is feeling good. Doing anything he wants. He is out deer hunting. Can walk in the woods for a couple hours. No CP. Denies SOB, orthopnea or PND. No edema. Following BPs closely and running 102-122/50 HR in 50-60 range. Weight stable at 202. Still straight cathing 3 times per day. INR has been very labile with easy bruising.   Echo 6/19 - EF 35-40% mild AS    ECHO 07/2017 Mildly dilated LV with mild LV hypertrophy, EF 25%. Diffuse   hypokinesis with inferolateral akinesis. Moderate diastolic   dysfunction. Mildly dilated RV with moderately decreased systolic   function.  Tethering of the posterior mitral leaflet with at least   moderate mitral regurgitation. Borderline pulmonary hypertension  Review of systems complete and found to be negative unless listed in HPI.    SH:  Social History   Socioeconomic History  . Marital status: Married    Spouse name: Not on file  . Number of children: 3  . Years of education: Associates  . Highest education level: Not on file  Occupational History  . Occupation: Retired   Scientific laboratory technician  . Financial resource strain: Not on file  . Food insecurity:    Worry: Not on file    Inability: Not on file  . Transportation needs:    Medical: Not on file    Non-medical: Not on file  Tobacco Use  . Smoking status: Never Smoker  . Smokeless tobacco: Never Used  Substance and Sexual Activity  . Alcohol use: No    Alcohol/week: 0.0 standard drinks  . Drug use: No  . Sexual activity: Yes  Lifestyle  . Physical activity:    Days per week: Not on file    Minutes per session: Not on file  . Stress: Not on file  Relationships  . Social connections:    Talks on phone: Not on file    Gets together: Not on file    Attends religious service: Not on file    Active member of club or organization: Not on file    Attends meetings of clubs or organizations: Not on file  Relationship status: Not on file  . Intimate partner violence:    Fear of current or ex partner: Not on file    Emotionally abused: Not on file    Physically abused: Not on file    Forced sexual activity: Not on file  Other Topics Concern  . Not on file  Social History Narrative   Drinks 1-2 cups of coffee a day     FH:  Family History  Problem Relation Age of Onset  . Stroke Mother   . Hypertension Mother   . Heart attack Father   . Deep vein thrombosis Father   . Crohn's disease Brother   . Schizophrenia Sister     Past Medical History:  Diagnosis Date  . Congestive heart failure (CHF) ([place])   . Diabetes mellitus without complication (HCC)     no medications  . Diverticulosis   . DVT (deep venous thrombosis) ([place])   . Heart disease   . Hyperlipemia   . Kidney stone   . NICM (nonischemic cardiomyopathy) ([place])   . OSA (obstructive sleep apnea)   . Stroke ([place])   . TIA (transient ischemic attack) 2008   was seen on CT   . Toe infection     Current Outpatient Medications  Medication Sig Dispense Refill  . alfuzosin (UROXATRAL) 10 MG 24 hr tablet Take 10 mg by mouth at bedtime.    Marland Kitchen amiodarone (PACERONE) 200 MG tablet Take 0.5 tablets (100 mg total) by mouth daily. 15 tablet 11  . Cholecalciferol 2000 UNITS CAPS Take 2,000 Units by mouth 2 (two) times daily.     . finasteride (PROSCAR) 5 MG tablet Take 5 mg by mouth daily.    Marland Kitchen GARLIC PO Take 1 tablet by mouth daily.    Marland Kitchen losartan (COZAAR) 25 MG tablet Take 0.5 tablets (12.5 mg total) by mouth daily. 90 tablet 3  . MAGNESIUM PO Take 1 tablet by mouth 2 (two) times daily.     . Methylsulfonylmethane (MSM) 1000 MG CAPS Take 1,000 mg by mouth daily.     . sodium chloride (OCEAN) 0.65 % SOLN nasal spray Place 1 spray into both nostrils as needed for congestion.    Marland Kitchen spironolactone (ALDACTONE) 25 MG tablet Take 0.5 tablets (12.5 mg total) by mouth daily. 30 tablet 6  . torsemide (DEMADEX) 20 MG tablet Take 1 tablet (20 mg total) by mouth daily. 30 tablet 11  . warfarin (COUMADIN) 5 MG tablet Take 2.5-5 mg by mouth See admin instructions. 5 mg by mouth at bedtime on Sun/Tues/Thurs/Sat and 2.5 mg on Mon/Wed/Fri     No current facility-administered medications for this encounter.     Vitals:   05/19/18 1333  BP: (!) 124/55  Pulse: (!) 55  SpO2: 100%  Weight: 95.3 kg (210 lb 3.2 oz)   Wt Readings from Last 3 Encounters:  05/19/18 95.3 kg (210 lb 3.2 oz)  02/01/18 94.6 kg (208 lb 8 oz)  10/28/17 91.4 kg (201 lb 9.6 oz)    PHYSICAL EXAM: General:  Well appearing. No resp difficulty HEENT: normal Neck: supple. no JVD. Carotids 2+ bilat; no bruits. No lymphadenopathy or  thryomegaly appreciated. Cor: PMI nondisplaced. Regular rate & rhythm. 2/6 AS Lungs: clear Abdomen: obese soft, nontender, nondistended. No hepatosplenomegaly. No bruits or masses. Good bowel sounds. Extremities: no cyanosis, clubbing, rash, edema Neuro: alert & orientedx3, cranial nerves grossly intact. moves all 4 extremities w/o difficulty. Affect pleasant  EKG:  Sinus brady 49 1st degree heart block (  215ms) No PVCs Personally reviewed   ASSESSMENT & PLAN: 1. Chronic Combined Systolic Heart Failure due to NICM. Suspect PVC cardiomyoapthy - Had [place] 2016 with nonobstructive CAD. No recent cath due to CKD  - ECHO 07/2017 EF 25% Grade II DD, Mod MR, Severe LAE. - Hospitalized 1/19 with cardiogenic shock requiring milrinone - Echo 5/19 EF 35-40% -> EF improved with PVC suppression - Improved NYHA I-II symptoms  - Volume status stable on exam.  - Continue torsemide 20 mg daily.  - Continue losartan 12.5 mg daily. BP too soft for Entresto - Continue spiro 12.5 mg daily. Up-titration has been limited by soft BP.  - HR too low for b-blocker - Repeat Echo in 6 months   2. Frequent PVCs  - Suppressed well with amiodarone  - Continue amio 100 daily   3. Moderate MR - Echo in 6/19 with trivial MR  4. L carotid bruit - Carotid US 03/2017 1-39% . Repeat prn   5. H/O DVT - Continue long term coumadin. INR followed by PCP.  - Would consider switching to apixaban 2.5 bid per Amplify EXT trial. Hey will discuss with Oren Section at Dr. Keane [facility] office.   6. BPH/urinary retention - Continue meds per urology - Ok to proceed with prostate surgery as needed.   7. Hyperlipidemia - LDL 175. Refuses statin. Consider Repatha. He will d/w Dr. Virgel Manifold, MD  2:07 PM

## 2018-05-19 NOTE — Patient Instructions (Signed)
EKG done today  Your physician has requested that you have an echocardiogram. Echocardiography is a painless test that uses sound waves to create images of your heart. It provides your doctor with information about the size and shape of your heart and how well your heart's chambers and valves are working. This procedure takes approximately one hour. There are no restrictions for this procedure.  Follow up with Dr. Haroldine Laws in 6 months

## 2018-05-27 DIAGNOSIS — I82403 Acute embolism and thrombosis of unspecified deep veins of lower extremity, bilateral: Secondary | ICD-10-CM | POA: Diagnosis not present

## 2018-05-27 DIAGNOSIS — Z7901 Long term (current) use of anticoagulants: Secondary | ICD-10-CM | POA: Diagnosis not present

## 2018-07-28 ENCOUNTER — Other Ambulatory Visit (HOSPITAL_COMMUNITY): Payer: Self-pay | Admitting: Adult Health

## 2018-07-31 IMAGING — US US RENAL
1 series · 14 of 25 positions shown · non-contrast
Comparison: CT 01/12/2014.

CLINICAL DATA: Elevated creatinine.

EXAM:
RENAL / URINARY TRACT ULTRASOUND COMPLETE

[Series 1: us renal · 0.23mm/px · 14 of 50 slices shown]
[im 1/50]
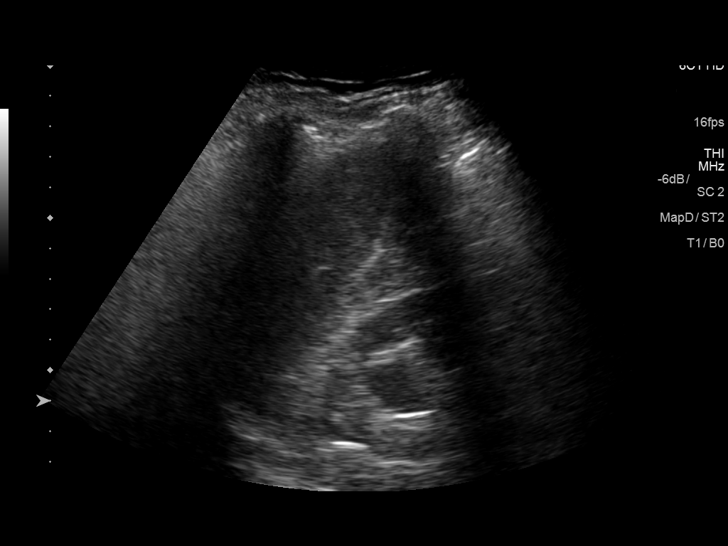
[im 5/50]
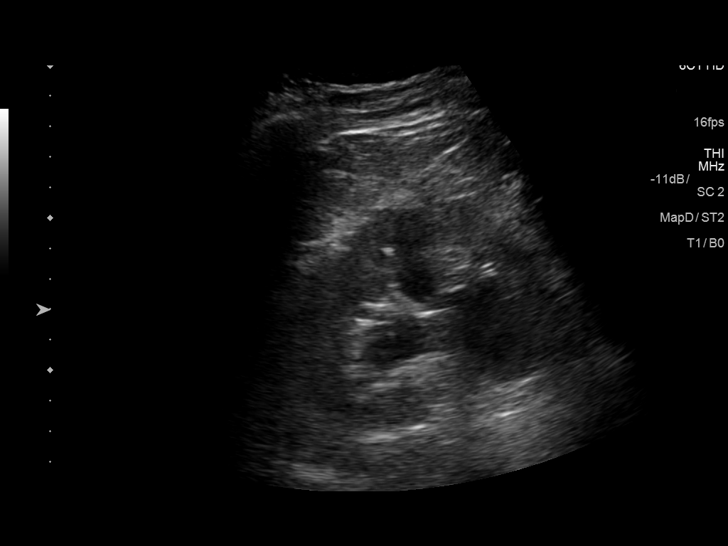
[im 9/50]
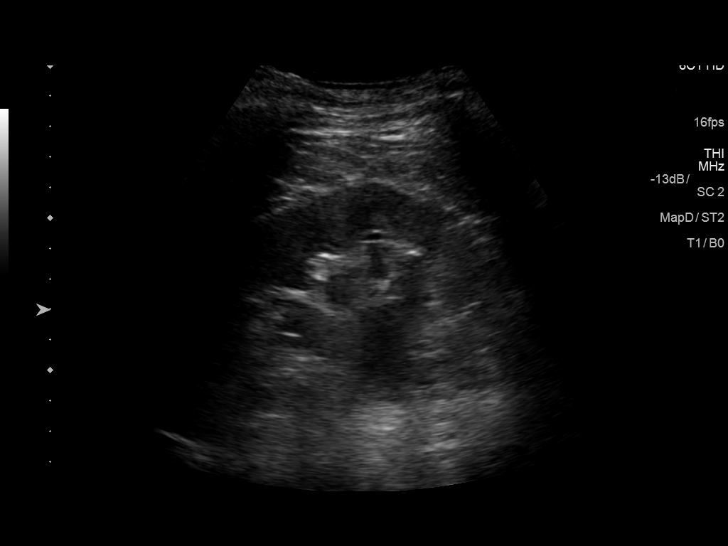
[im 13/50]
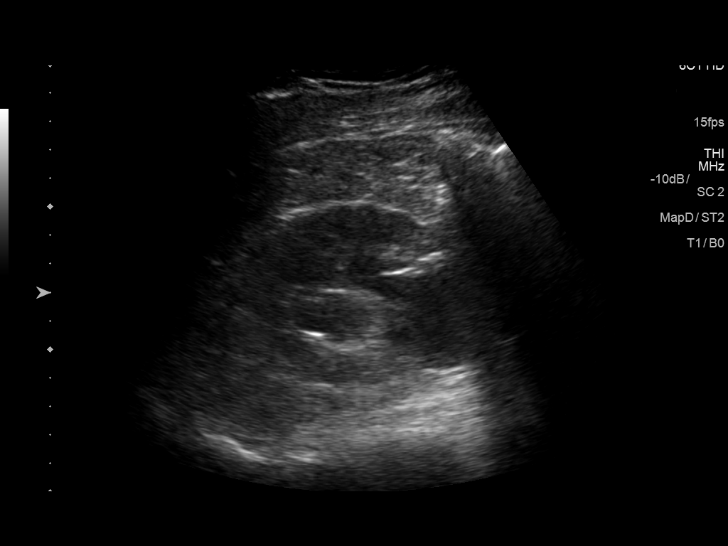
[im 17/50]
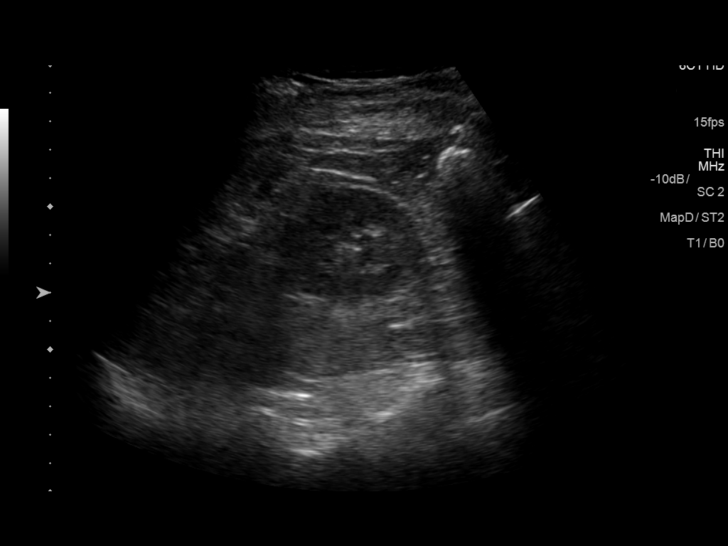
[im 19/50]
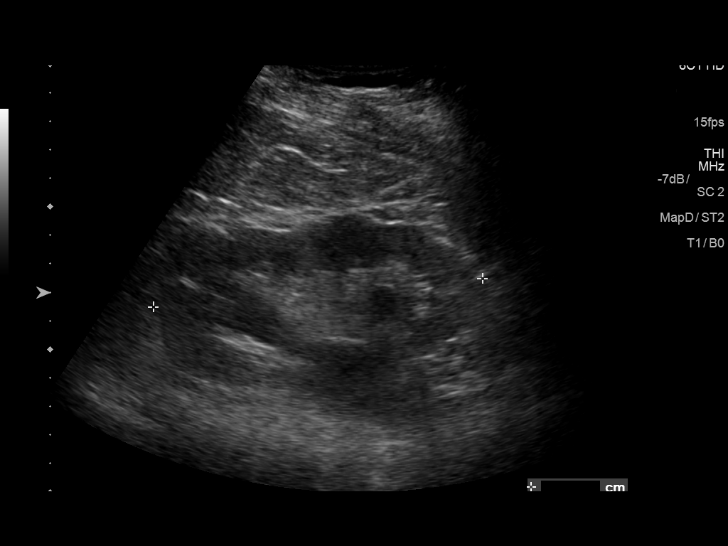
[im 23/50]
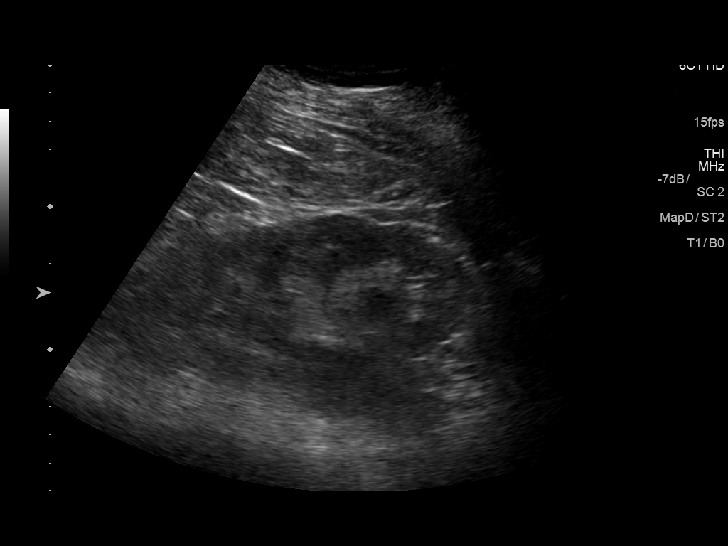
[im 27/50]
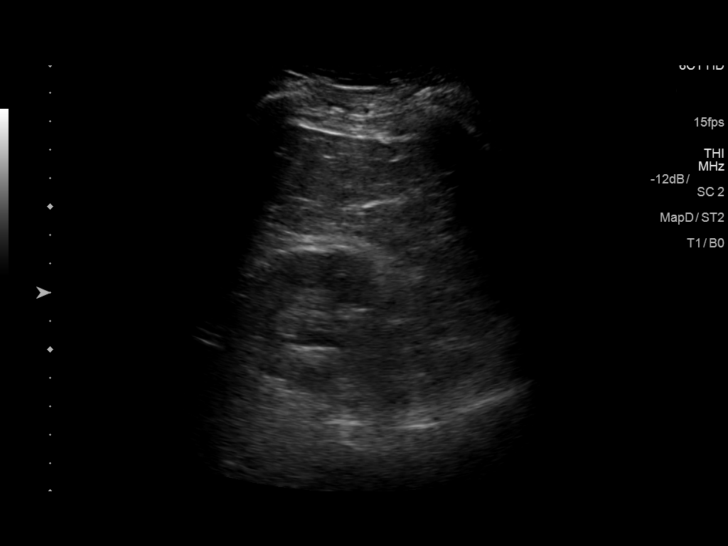
[im 31/50]
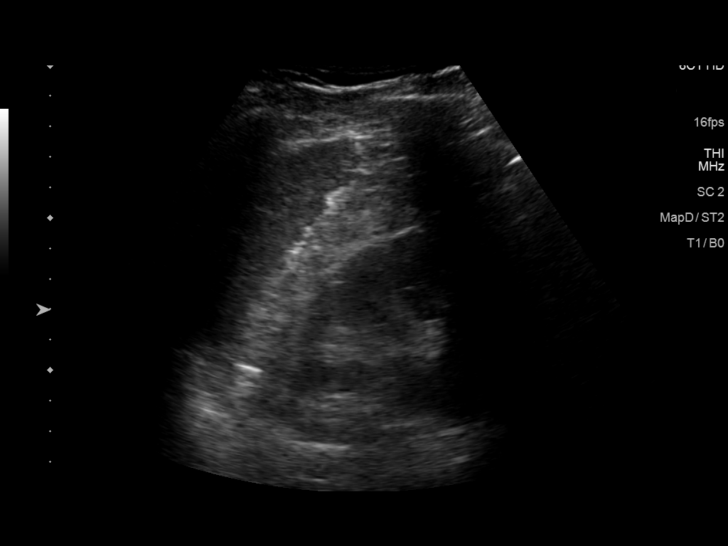
[im 33/50]
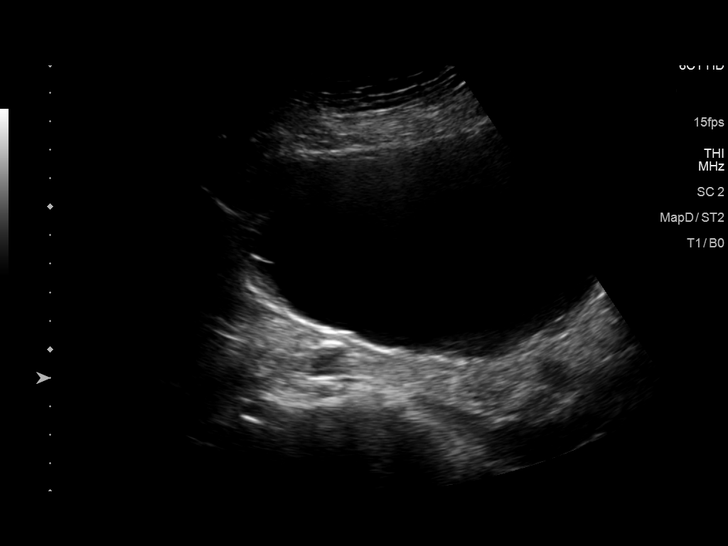
[im 37/50]
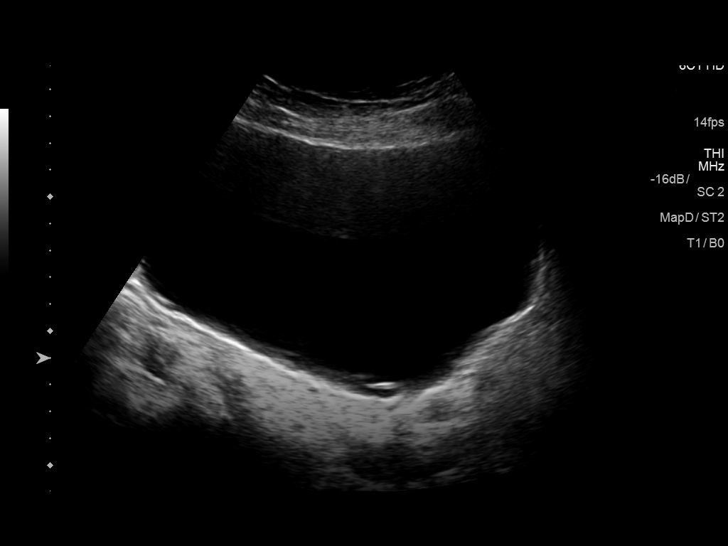
[im 41/50]
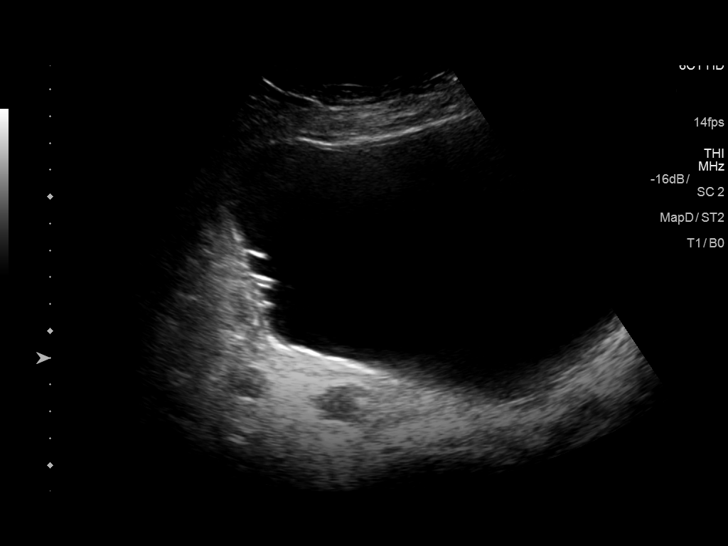
[im 45/50]
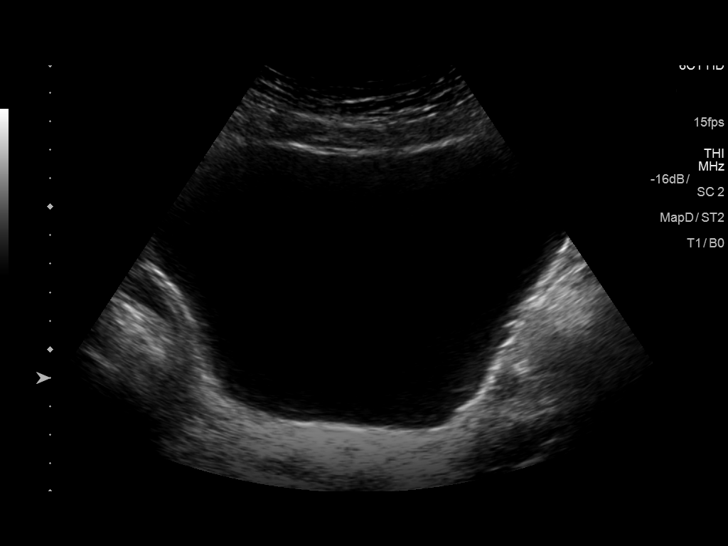
[im 50/50]
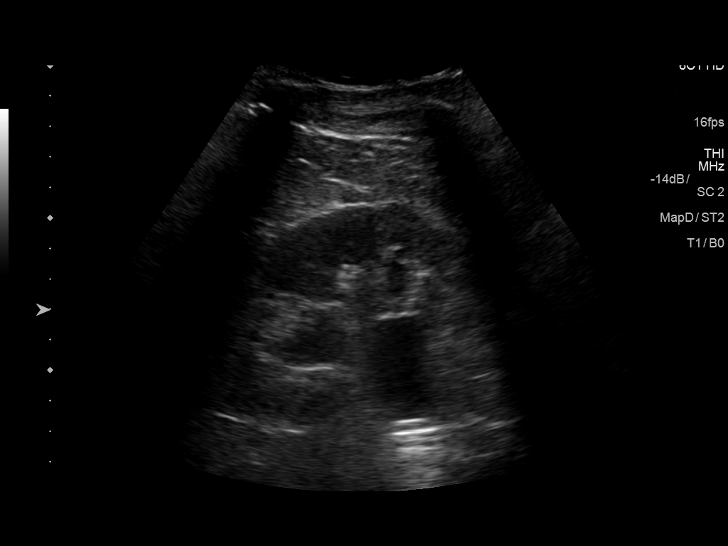

[14 of 25 positions shown; findings below may reference images not displayed]

FINDINGS: Right Kidney:

Length: 10.9 cm. Echogenicity within normal limits. No mass.
Moderate hydronephrosis visualized.

Left Kidney:

Length: 12.1 cm. Echogenicity within normal limits. No mass.
Moderate hydronephrosis visualized.

Bladder:

Bladder is distended with multiple diverticula. Ureteral jets not
visualized.
IMPRESSION: Bladder distention with multiple bladder diverticuli. Associated
moderate bilateral hydronephrosis. No ureteral jets identified.

## 2018-09-15 ENCOUNTER — Other Ambulatory Visit (HOSPITAL_COMMUNITY): Payer: Self-pay | Admitting: Internal Medicine

## 2018-09-15 ENCOUNTER — Other Ambulatory Visit (HOSPITAL_COMMUNITY): Payer: Self-pay | Admitting: Adult Health

## 2018-09-16 ENCOUNTER — Other Ambulatory Visit (HOSPITAL_COMMUNITY): Payer: Self-pay

## 2018-09-16 MED ORDER — SPIRONOLACTONE 25 MG PO TABS: 12.5000 mg | ORAL_TABLET | Freq: Every day | ORAL | 5 refills | Status: DC

## 2018-10-01 DIAGNOSIS — D692 Other nonthrombocytopenic purpura: Secondary | ICD-10-CM | POA: Diagnosis not present

## 2018-10-01 DIAGNOSIS — I472 Ventricular tachycardia: Secondary | ICD-10-CM | POA: Diagnosis not present

## 2018-10-01 DIAGNOSIS — Z7901 Long term (current) use of anticoagulants: Secondary | ICD-10-CM | POA: Diagnosis not present

## 2018-10-01 DIAGNOSIS — N320 Bladder-neck obstruction: Secondary | ICD-10-CM | POA: Diagnosis not present

## 2018-10-01 DIAGNOSIS — Z1331 Encounter for screening for depression: Secondary | ICD-10-CM | POA: Diagnosis not present

## 2018-10-01 DIAGNOSIS — Z79899 Other long term (current) drug therapy: Secondary | ICD-10-CM | POA: Diagnosis not present

## 2018-10-01 DIAGNOSIS — I5042 Chronic combined systolic (congestive) and diastolic (congestive) heart failure: Secondary | ICD-10-CM | POA: Diagnosis not present

## 2018-10-01 DIAGNOSIS — E119 Type 2 diabetes mellitus without complications: Secondary | ICD-10-CM | POA: Diagnosis not present

## 2018-10-01 DIAGNOSIS — I2510 Atherosclerotic heart disease of native coronary artery without angina pectoris: Secondary | ICD-10-CM | POA: Diagnosis not present

## 2018-10-01 DIAGNOSIS — N183 Chronic kidney disease, stage 3 (moderate): Secondary | ICD-10-CM | POA: Diagnosis not present

## 2018-10-01 DIAGNOSIS — I2721 Secondary pulmonary arterial hypertension: Secondary | ICD-10-CM | POA: Diagnosis not present

## 2018-10-01 DIAGNOSIS — I509 Heart failure, unspecified: Secondary | ICD-10-CM | POA: Diagnosis not present

## 2018-10-12 ENCOUNTER — Other Ambulatory Visit (HOSPITAL_COMMUNITY): Payer: Self-pay | Admitting: Adult Health

## 2018-10-19 ENCOUNTER — Other Ambulatory Visit (HOSPITAL_COMMUNITY): Payer: Self-pay | Admitting: Internal Medicine

## 2018-10-20 ENCOUNTER — Other Ambulatory Visit (HOSPITAL_COMMUNITY): Payer: Self-pay | Admitting: Adult Health

## 2018-10-28 ENCOUNTER — Other Ambulatory Visit (HOSPITAL_COMMUNITY): Payer: Self-pay

## 2018-10-28 MED ORDER — AMIODARONE HCL 200 MG PO TABS: 100.0000 mg | ORAL_TABLET | Freq: Every day | ORAL | 3 refills | Status: DC

## 2018-10-28 NOTE — Telephone Encounter (Signed)
Request from pharmacy to change amio to 200mg , 0.5 tab daily because the 100mg  tab was too expensive. Made change

## 2018-12-10 DIAGNOSIS — N390 Urinary tract infection, site not specified: Secondary | ICD-10-CM | POA: Diagnosis not present

## 2019-01-06 DIAGNOSIS — E039 Hypothyroidism, unspecified: Secondary | ICD-10-CM | POA: Diagnosis not present

## 2019-01-06 DIAGNOSIS — N390 Urinary tract infection, site not specified: Secondary | ICD-10-CM | POA: Diagnosis not present

## 2019-01-06 DIAGNOSIS — R8290 Unspecified abnormal findings in urine: Secondary | ICD-10-CM | POA: Diagnosis not present

## 2019-01-20 DIAGNOSIS — Z125 Encounter for screening for malignant neoplasm of prostate: Secondary | ICD-10-CM | POA: Diagnosis not present

## 2019-01-20 DIAGNOSIS — R339 Retention of urine, unspecified: Secondary | ICD-10-CM | POA: Diagnosis not present

## 2019-01-20 DIAGNOSIS — R338 Other retention of urine: Secondary | ICD-10-CM | POA: Diagnosis not present

## 2019-01-20 DIAGNOSIS — N401 Enlarged prostate with lower urinary tract symptoms: Secondary | ICD-10-CM | POA: Diagnosis not present

## 2019-03-25 DIAGNOSIS — Z125 Encounter for screening for malignant neoplasm of prostate: Secondary | ICD-10-CM | POA: Diagnosis not present

## 2019-03-25 DIAGNOSIS — Z23 Encounter for immunization: Secondary | ICD-10-CM | POA: Diagnosis not present

## 2019-03-25 DIAGNOSIS — E7849 Other hyperlipidemia: Secondary | ICD-10-CM | POA: Diagnosis not present

## 2019-03-25 DIAGNOSIS — R82998 Other abnormal findings in urine: Secondary | ICD-10-CM | POA: Diagnosis not present

## 2019-03-25 DIAGNOSIS — E119 Type 2 diabetes mellitus without complications: Secondary | ICD-10-CM | POA: Diagnosis not present

## 2019-03-25 DIAGNOSIS — N183 Chronic kidney disease, stage 3 (moderate): Secondary | ICD-10-CM | POA: Diagnosis not present

## 2019-03-25 DIAGNOSIS — E039 Hypothyroidism, unspecified: Secondary | ICD-10-CM | POA: Diagnosis not present

## 2019-03-28 ENCOUNTER — Other Ambulatory Visit (HOSPITAL_COMMUNITY): Payer: Self-pay | Admitting: Internal Medicine

## 2019-04-01 DIAGNOSIS — N1330 Unspecified hydronephrosis: Secondary | ICD-10-CM | POA: Diagnosis not present

## 2019-04-01 DIAGNOSIS — N320 Bladder-neck obstruction: Secondary | ICD-10-CM | POA: Diagnosis not present

## 2019-04-01 DIAGNOSIS — I509 Heart failure, unspecified: Secondary | ICD-10-CM | POA: Diagnosis not present

## 2019-04-01 DIAGNOSIS — Z0000 Encounter for general adult medical examination without abnormal findings: Secondary | ICD-10-CM | POA: Diagnosis not present

## 2019-04-01 DIAGNOSIS — I472 Ventricular tachycardia: Secondary | ICD-10-CM | POA: Diagnosis not present

## 2019-04-01 DIAGNOSIS — I5042 Chronic combined systolic (congestive) and diastolic (congestive) heart failure: Secondary | ICD-10-CM | POA: Diagnosis not present

## 2019-04-01 DIAGNOSIS — N183 Chronic kidney disease, stage 3 (moderate): Secondary | ICD-10-CM | POA: Diagnosis not present

## 2019-04-01 DIAGNOSIS — Z79899 Other long term (current) drug therapy: Secondary | ICD-10-CM | POA: Diagnosis not present

## 2019-04-01 DIAGNOSIS — I2721 Secondary pulmonary arterial hypertension: Secondary | ICD-10-CM | POA: Diagnosis not present

## 2019-04-01 DIAGNOSIS — E119 Type 2 diabetes mellitus without complications: Secondary | ICD-10-CM | POA: Diagnosis not present

## 2019-04-01 DIAGNOSIS — E039 Hypothyroidism, unspecified: Secondary | ICD-10-CM | POA: Diagnosis not present

## 2019-04-01 DIAGNOSIS — H532 Diplopia: Secondary | ICD-10-CM | POA: Diagnosis not present

## 2019-04-06 DIAGNOSIS — Z1212 Encounter for screening for malignant neoplasm of rectum: Secondary | ICD-10-CM | POA: Diagnosis not present

## 2019-04-11 ENCOUNTER — Other Ambulatory Visit (HOSPITAL_COMMUNITY): Payer: Self-pay | Admitting: Adult Health

## 2019-04-29 ENCOUNTER — Other Ambulatory Visit (HOSPITAL_COMMUNITY): Payer: Self-pay | Admitting: Internal Medicine

## 2019-05-06 ENCOUNTER — Other Ambulatory Visit (HOSPITAL_COMMUNITY): Payer: Self-pay | Admitting: Internal Medicine

## 2019-05-30 ENCOUNTER — Other Ambulatory Visit (HOSPITAL_COMMUNITY): Payer: Self-pay | Admitting: Internal Medicine

## 2019-06-08 ENCOUNTER — Ambulatory Visit (HOSPITAL_BASED_OUTPATIENT_CLINIC_OR_DEPARTMENT_OTHER)
Admission: RE | Admit: 2019-06-08 | Discharge: 2019-06-08 | Disposition: A | Discharge status: Home / Self Care | Payer: Medicare Other | Source: Ambulatory Visit | Attending: Internal Medicine | Admitting: Internal Medicine

## 2019-06-08 ENCOUNTER — Ambulatory Visit (HOSPITAL_COMMUNITY)
Admission: RE | Admit: 2019-06-08 | Discharge: 2019-06-08 | Disposition: A | Discharge status: Home / Self Care | Payer: Medicare Other | Source: Ambulatory Visit | Attending: Internal Medicine | Admitting: Internal Medicine

## 2019-06-08 ENCOUNTER — Other Ambulatory Visit: Payer: Self-pay

## 2019-06-08 ENCOUNTER — Encounter (HOSPITAL_COMMUNITY): Payer: Self-pay | Admitting: Internal Medicine

## 2019-06-08 VITALS — BP 118/58 | HR 50 | Wt 212.5 lb

## 2019-06-08 DIAGNOSIS — I5042 Chronic combined systolic (congestive) and diastolic (congestive) heart failure: Secondary | ICD-10-CM | POA: Diagnosis not present

## 2019-06-08 DIAGNOSIS — I447 Left bundle-branch block, unspecified: Secondary | ICD-10-CM | POA: Diagnosis not present

## 2019-06-08 DIAGNOSIS — Z8249 Family history of ischemic heart disease and other diseases of the circulatory system: Secondary | ICD-10-CM | POA: Diagnosis not present

## 2019-06-08 DIAGNOSIS — Z79899 Other long term (current) drug therapy: Secondary | ICD-10-CM | POA: Diagnosis not present

## 2019-06-08 DIAGNOSIS — Z86718 Personal history of other venous thrombosis and embolism: Secondary | ICD-10-CM | POA: Insufficient documentation

## 2019-06-08 DIAGNOSIS — E785 Hyperlipidemia, unspecified: Secondary | ICD-10-CM | POA: Insufficient documentation

## 2019-06-08 DIAGNOSIS — I5022 Chronic systolic (congestive) heart failure: Secondary | ICD-10-CM

## 2019-06-08 DIAGNOSIS — Z7901 Long term (current) use of anticoagulants: Secondary | ICD-10-CM | POA: Insufficient documentation

## 2019-06-08 DIAGNOSIS — R001 Bradycardia, unspecified: Secondary | ICD-10-CM

## 2019-06-08 DIAGNOSIS — E039 Hypothyroidism, unspecified: Secondary | ICD-10-CM | POA: Diagnosis not present

## 2019-06-08 DIAGNOSIS — E1122 Type 2 diabetes mellitus with diabetic chronic kidney disease: Secondary | ICD-10-CM | POA: Insufficient documentation

## 2019-06-08 DIAGNOSIS — I493 Ventricular premature depolarization: Secondary | ICD-10-CM | POA: Diagnosis not present

## 2019-06-08 DIAGNOSIS — Z8673 Personal history of transient ischemic attack (TIA), and cerebral infarction without residual deficits: Secondary | ICD-10-CM | POA: Diagnosis not present

## 2019-06-08 DIAGNOSIS — I428 Other cardiomyopathies: Secondary | ICD-10-CM | POA: Insufficient documentation

## 2019-06-08 DIAGNOSIS — I82509 Chronic embolism and thrombosis of unspecified deep veins of unspecified lower extremity: Secondary | ICD-10-CM

## 2019-06-08 NOTE — Patient Instructions (Signed)
We will contact you in 9 months to schedule your next appointment.  

## 2019-06-08 NOTE — Progress Notes (Signed)
Advanced Heart Failure Clinic Note  Primary HF Cardiologist: Dr Haroldine Laws   HPI: Michael Randolph is a 84 y.o. malewith past medical history of nonischemic cardiomyopathy, PVCs, DVT, MR, DMII, CVA,and left carotid bruit.   He had stopped all HF medications September 2018 and started feeling poorly in December 2018. Admitted 07/25/17 with marked volume overload and was diuresed IV lasix but had poor response. ECHO completed and showed EF 25% , RV mildly dilated, and grade II DD. Started on milrinone to support RV and diurese. Diuresed with IV lasix + metolazone and later transitioned to torsemide 40 mg daily. Renal function was followed closely with diuretics adjusted. He was not placed on ARB due to elevated creatinine. He was not placed on bb due to low output. He had PVCs prior to admit but with increasing frequency after starting milrinone so IV amio was started. Once milrinone was stopped he transitioned to amiodarone 200 mg twice daily. PVCs reduced after milrinone was stopped. He was on coumadin prior to admit for history of DVT. INR will continue to be managed in the community by Dr Virgina Jock. Discharged 08/05/2017-->207 pounds.   He presents today for regular follow up. He is here with his wife. Feels really good. Working out in yard and deer hunting. No edema, orthopnea or PND. Watching BP closely SBP 105-120. HRs in mid 40s    Echo 06/08/19 EF 40-45% Personally reviewed  Echo 6/19 - EF 35-40% mild AS   Echo 1/19  - EF 25%   Review of systems complete and found to be negative unless listed in HPI.    SH:  Social History   Socioeconomic History  . Marital status: Married    Spouse name: Not on file  . Number of children: 3  . Years of education: Associates  . Highest education level: Not on file  Occupational History  . Occupation: Retired   Scientific laboratory technician  . Financial resource strain: Not on file  . Food insecurity    Worry: Not on file    Inability: Not on file  .  Transportation needs    Medical: Not on file    Non-medical: Not on file  Tobacco Use  . Smoking status: Never Smoker  . Smokeless tobacco: Never Used  Substance and Sexual Activity  . Alcohol use: No    Alcohol/week: 0.0 standard drinks  . Drug use: No  . Sexual activity: Yes  Lifestyle  . Physical activity    Days per week: Not on file    Minutes per session: Not on file  . Stress: Not on file  Relationships  . Social Herbalist on phone: Not on file    Gets together: Not on file    Attends religious service: Not on file    Active member of club or organization: Not on file    Attends meetings of clubs or organizations: Not on file    Relationship status: Not on file  . Intimate partner violence    Fear of current or ex partner: Not on file    Emotionally abused: Not on file    Physically abused: Not on file    Forced sexual activity: Not on file  Other Topics Concern  . Not on file  Social History Narrative   Drinks 1-2 cups of coffee a day     FH:  Family History  Problem Relation Age of Onset  . Stroke Mother   . Hypertension Mother   . Heart  attack Father   . Deep vein thrombosis Father   . Crohn's disease Brother   . Schizophrenia Sister     Past Medical History:  Diagnosis Date  . Congestive heart failure (CHF) ([place])   . Diabetes mellitus without complication (HCC)    no medications  . Diverticulosis   . DVT (deep venous thrombosis) ([place])   . Heart disease   . Hyperlipemia   . Kidney stone   . NICM (nonischemic cardiomyopathy) ([place])   . OSA (obstructive sleep apnea)   . Stroke ([place])   . TIA (transient ischemic attack) 2008   was seen on CT   . Toe infection     Current Outpatient Medications  Medication Sig Dispense Refill  . amiodarone (PACERONE) 200 MG tablet Take 0.5 tablets (100 mg total) by mouth daily. 45 tablet 3  . apixaban (ELIQUIS) 2.5 MG TABS tablet Take 2.5 mg by mouth 2 (two) times daily.    . Cholecalciferol 2000  UNITS CAPS Take 2,000 Units by mouth 2 (two) times daily.     . finasteride (PROSCAR) 5 MG tablet Take 5 mg by mouth daily.    Marland Kitchen GARLIC PO Take 1 tablet by mouth daily.    Marland Kitchen levothyroxine (SYNTHROID) 50 MCG tablet Take 50 mcg by mouth daily before breakfast.    . losartan (COZAAR) 25 MG tablet Take 1/2 (one-half) tablet by mouth once daily 45 tablet 0  . MAGNESIUM PO Take 1 tablet by mouth 2 (two) times daily.     . Methylsulfonylmethane (MSM) 1000 MG CAPS Take 1,000 mg by mouth daily.     . Probiotic Product (PROBIOTIC-10 PO) Take 1 tablet by mouth daily.    . sodium chloride (OCEAN) 0.65 % SOLN nasal spray Place 1 spray into both nostrils as needed for congestion.    Marland Kitchen spironolactone (ALDACTONE) 25 MG tablet Take 1/2 (one-half) tablet by mouth once daily 15 tablet 0  . torsemide (DEMADEX) 20 MG tablet Take 20 mg by mouth daily.     No current facility-administered medications for this encounter.     Vitals:   06/08/19 1127  BP: (!) 118/58  Pulse: (!) 50  SpO2: 98%  Weight: 96.4 kg (212 lb 8 oz)   Wt Readings from Last 3 Encounters:  06/08/19 96.4 kg (212 lb 8 oz)  05/19/18 95.3 kg (210 lb 3.2 oz)  02/01/18 94.6 kg (208 lb 8 oz)    PHYSICAL EXAM: General:  Elderly No resp difficulty HEENT: normal Neck: supple. no JVD. Carotids 2+ bilat; no bruits. No lymphadenopathy or thryomegaly appreciated. Cor: PMI nondisplaced. Brady regular No rubs, gallops or murmurs. Lungs: clear Abdomen: obese soft, nontender, nondistended. No hepatosplenomegaly. No bruits or masses. Good bowel sounds. Extremities: no cyanosis, clubbing, rash, edema Neuro: alert & orientedx3, cranial nerves grossly intact. moves all 4 extremities w/o difficulty. Affect pleasant   EKG:  Sinus brady 51 1st degree heart block (250ms) LBBB (was iLBBB previously) No PVCs Personally reviewed   ASSESSMENT & PLAN: 1. Chronic Combined Systolic Heart Failure due to NICM. Suspect PVC cardiomyoapthy - Had [place] 2016 with  nonobstructive CAD. No recent cath due to CKD  - ECHO 07/2017 EF 25% Grade II DD, Mod MR, Severe LAE. - Hospitalized 1/19 with cardiogenic shock requiring milrinone - Echo 5/19 EF 35-40% -> EF improved with PVC suppression - Echo today 06/07/24 EF 40-45% - Stable NYHA I-II symptoms - Volume status stable on exam.  - Continue torsemide 20 mg daily.  - Continue losartan  12.5 mg daily. BP too soft for Entresto to titrate losartan  - Continue spiro 12.5 mg daily. Up-titration has been limited by soft BP.  - HR too low for b-blocker  2. Frequent PVCs  - Suppressed well with amiodarone  - Continue amio 100mg  daily   3. Moderate MR - Echo in 6/19 and today with trivial MR  4. Bradycardiac with LBBB - has significant conduction system disease - we discussed fact that he may need PPM some day but currently does not qualify -  Can consider PYP study at some point to exclude amyloid but no other symptos to suggest  5. L carotid bruit - Carotid US 03/2017 1-39% . Repeat prn   6. H/O DVT - On apixaban 2.5 bid per Amplify EXT trial. No bleeding   7. Hyperlipidemia - LDL 175. Refuses statin. Consider Repatha. He will d/w Dr. Virgina Jock   8. Hypothyroidism - likely related to amiodarone - managed by Dr. Virgina Jock.  - continue synthroid   Glori Bickers, MD  11:45 AM

## 2019-06-08 NOTE — Addendum Note (Signed)
Encounter addended by: Scarlette Calico, RN on: 06/08/2019 12:04 PM  Actions taken: Flowsheet accepted, Clinical Note Signed

## 2019-06-08 NOTE — Progress Notes (Signed)
  Echocardiogram 2D Echocardiogram has been performed.  Michael Randolph 06/08/2019, 10:34 AM

## 2019-06-23 IMAGING — DX DG CHEST 2V
2 series · 2 of 2 positions shown · non-contrast
Comparison: 07/25/2017

CLINICAL DATA: Altered mentation.  Patient repeat things.

EXAM:
CHEST  2 VIEW

[x chest ap]
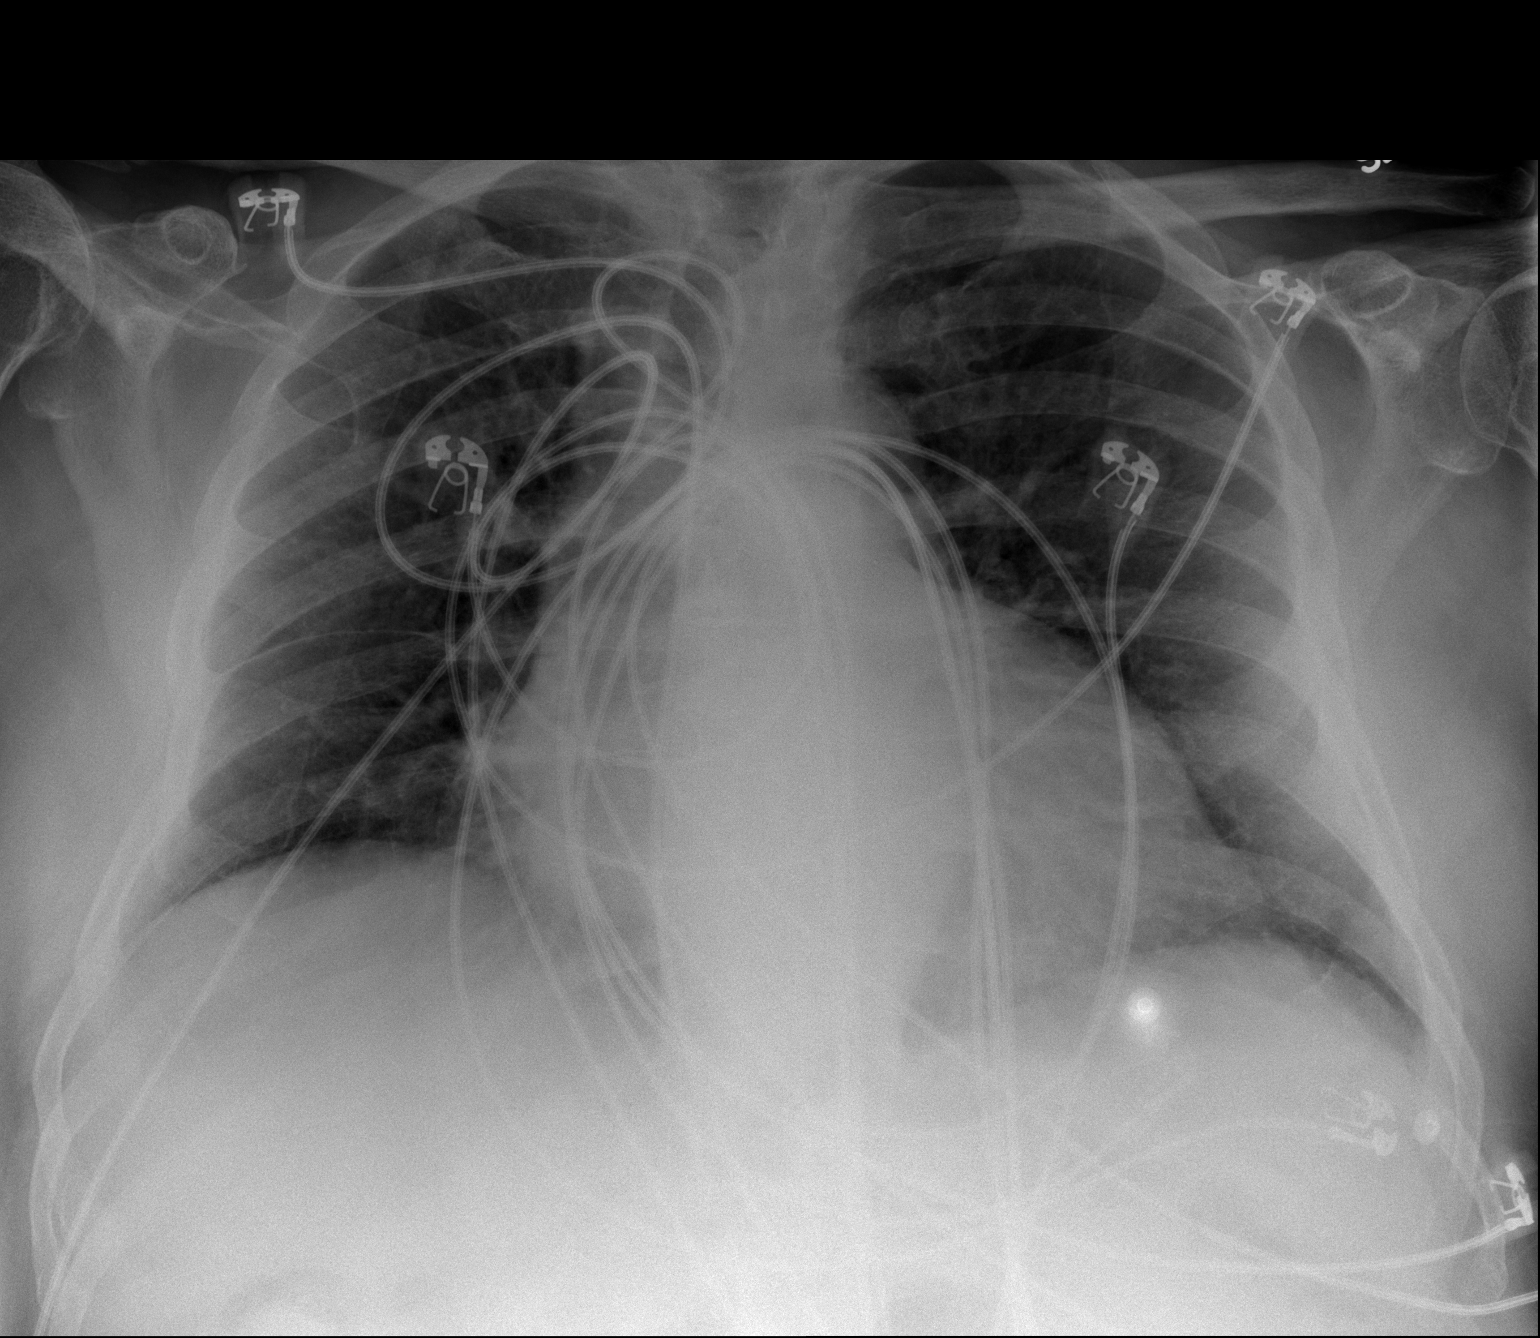

[w chest lat]
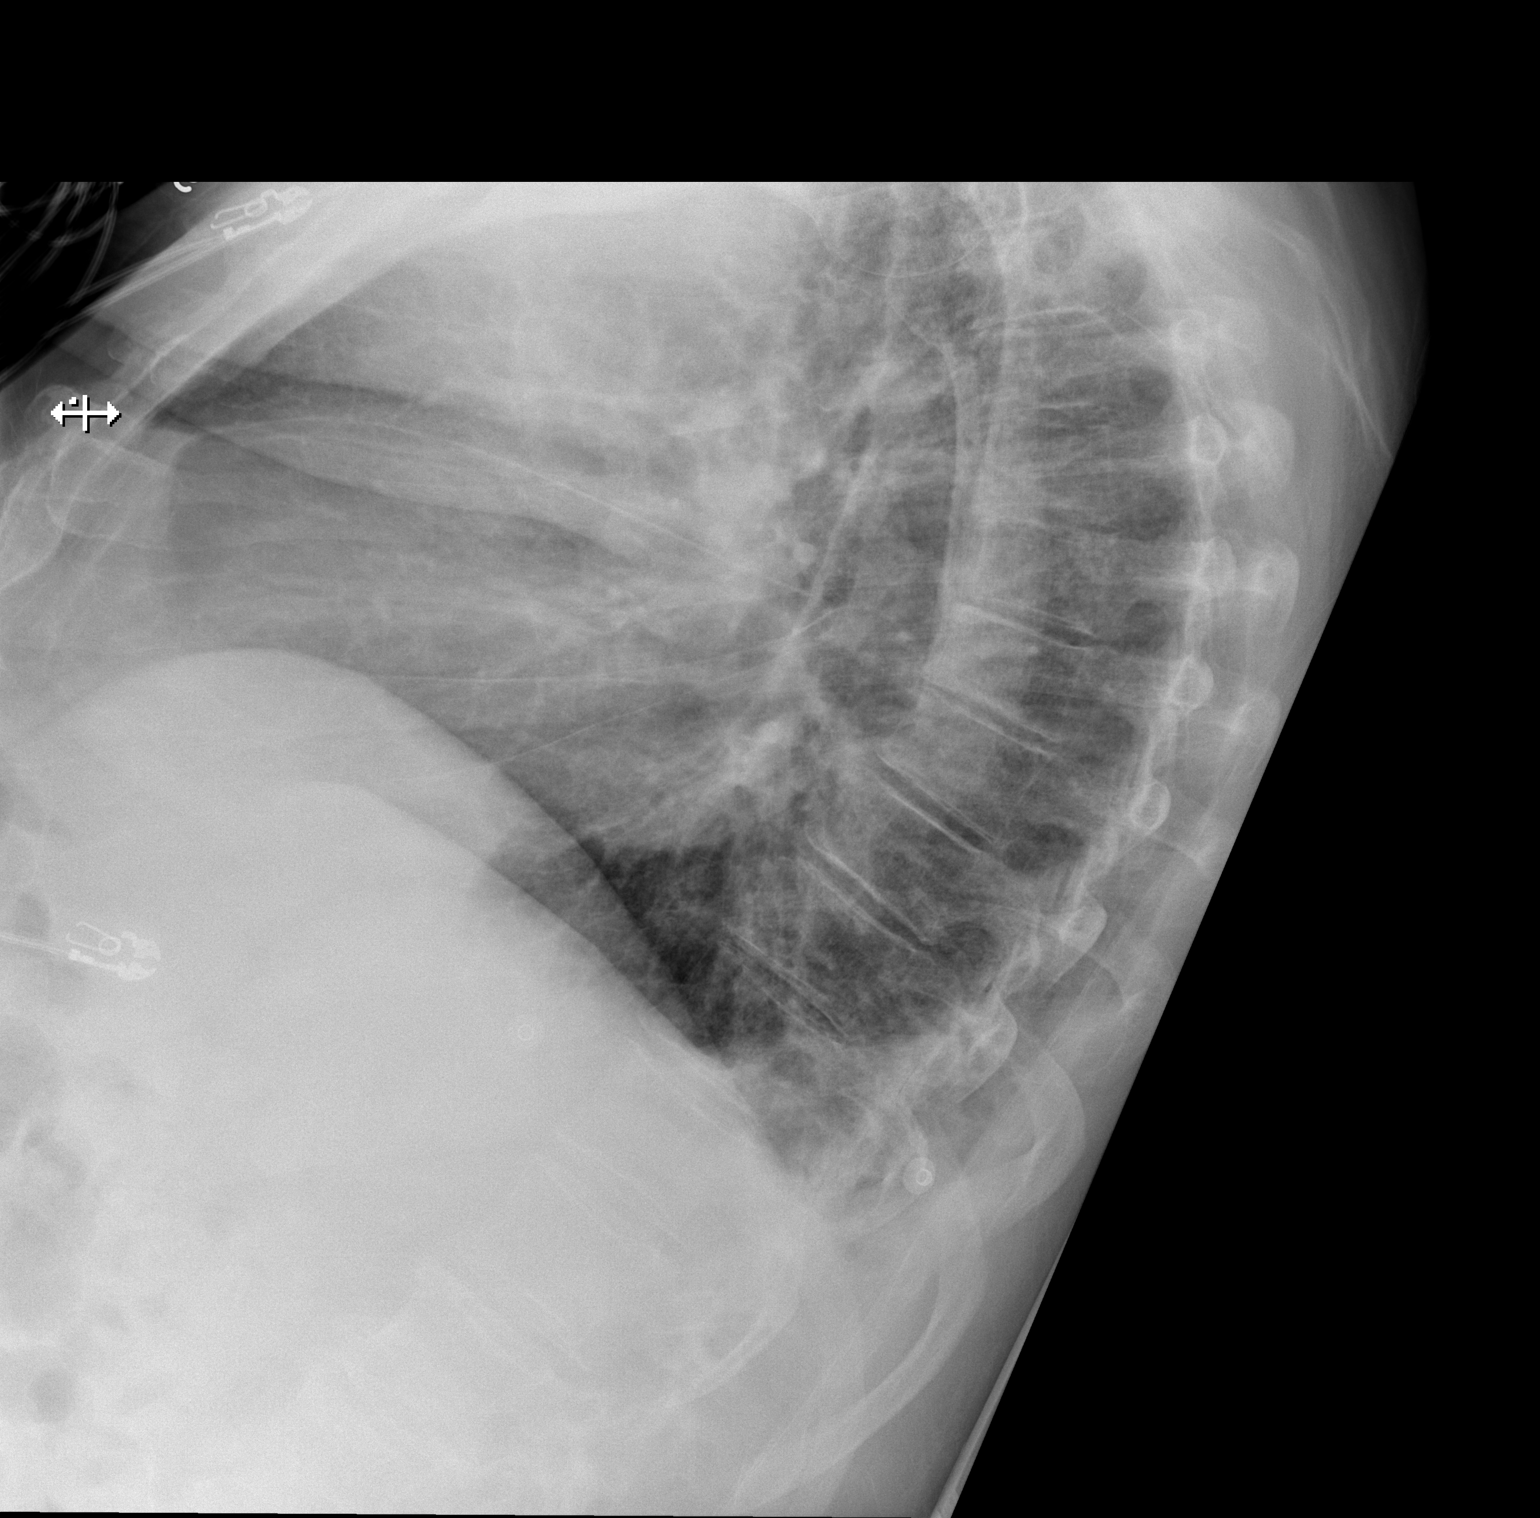

[2 of 2 positions shown; findings below may reference images not displayed]

FINDINGS: Cardiomegaly is stable with minimal aortic atherosclerosis. Clear
lungs without active pulmonary disease. No acute osseous
abnormality. Degenerative changes are seen dorsal spine as before.
IMPRESSION: Stable cardiomegaly without active pulmonary disease. Aortic
atherosclerosis without aneurysm.

## 2019-06-27 ENCOUNTER — Other Ambulatory Visit (HOSPITAL_COMMUNITY): Payer: Self-pay | Admitting: Internal Medicine

## 2019-07-11 ENCOUNTER — Other Ambulatory Visit (HOSPITAL_COMMUNITY): Payer: Self-pay | Admitting: Adult Health

## 2019-07-25 ENCOUNTER — Other Ambulatory Visit (HOSPITAL_COMMUNITY): Payer: Self-pay | Admitting: Internal Medicine

## 2019-08-22 ENCOUNTER — Other Ambulatory Visit (HOSPITAL_COMMUNITY): Payer: Self-pay | Admitting: Internal Medicine

## 2019-08-24 ENCOUNTER — Encounter: Payer: Self-pay | Admitting: Cardiovascular Disease

## 2019-09-13 ENCOUNTER — Other Ambulatory Visit (HOSPITAL_COMMUNITY): Payer: Self-pay | Admitting: Adult Health

## 2019-09-26 ENCOUNTER — Other Ambulatory Visit (HOSPITAL_COMMUNITY): Payer: Self-pay | Admitting: Internal Medicine

## 2019-10-10 ENCOUNTER — Other Ambulatory Visit (HOSPITAL_COMMUNITY): Payer: Self-pay | Admitting: Internal Medicine

## 2019-12-01 DIAGNOSIS — I472 Ventricular tachycardia: Secondary | ICD-10-CM | POA: Diagnosis not present

## 2019-12-01 DIAGNOSIS — Z86718 Personal history of other venous thrombosis and embolism: Secondary | ICD-10-CM | POA: Diagnosis not present

## 2019-12-01 DIAGNOSIS — E669 Obesity, unspecified: Secondary | ICD-10-CM | POA: Diagnosis not present

## 2019-12-01 DIAGNOSIS — I5042 Chronic combined systolic (congestive) and diastolic (congestive) heart failure: Secondary | ICD-10-CM | POA: Diagnosis not present

## 2019-12-01 DIAGNOSIS — E119 Type 2 diabetes mellitus without complications: Secondary | ICD-10-CM | POA: Diagnosis not present

## 2019-12-01 DIAGNOSIS — D692 Other nonthrombocytopenic purpura: Secondary | ICD-10-CM | POA: Diagnosis not present

## 2019-12-01 DIAGNOSIS — G4733 Obstructive sleep apnea (adult) (pediatric): Secondary | ICD-10-CM | POA: Diagnosis not present

## 2019-12-01 DIAGNOSIS — I2721 Secondary pulmonary arterial hypertension: Secondary | ICD-10-CM | POA: Diagnosis not present

## 2019-12-01 DIAGNOSIS — N320 Bladder-neck obstruction: Secondary | ICD-10-CM | POA: Diagnosis not present

## 2019-12-01 DIAGNOSIS — E7849 Other hyperlipidemia: Secondary | ICD-10-CM | POA: Diagnosis not present

## 2019-12-01 DIAGNOSIS — E038 Other specified hypothyroidism: Secondary | ICD-10-CM | POA: Diagnosis not present

## 2019-12-01 DIAGNOSIS — R251 Tremor, unspecified: Secondary | ICD-10-CM | POA: Diagnosis not present

## 2019-12-22 ENCOUNTER — Other Ambulatory Visit (HOSPITAL_COMMUNITY): Payer: Self-pay | Admitting: Internal Medicine

## 2020-02-02 DIAGNOSIS — R338 Other retention of urine: Secondary | ICD-10-CM | POA: Diagnosis not present

## 2020-02-02 DIAGNOSIS — N401 Enlarged prostate with lower urinary tract symptoms: Secondary | ICD-10-CM | POA: Diagnosis not present

## 2020-03-26 DIAGNOSIS — E785 Hyperlipidemia, unspecified: Secondary | ICD-10-CM | POA: Diagnosis not present

## 2020-03-26 DIAGNOSIS — Z125 Encounter for screening for malignant neoplasm of prostate: Secondary | ICD-10-CM | POA: Diagnosis not present

## 2020-03-26 DIAGNOSIS — E1122 Type 2 diabetes mellitus with diabetic chronic kidney disease: Secondary | ICD-10-CM | POA: Diagnosis not present

## 2020-04-02 DIAGNOSIS — Z86718 Personal history of other venous thrombosis and embolism: Secondary | ICD-10-CM | POA: Diagnosis not present

## 2020-04-02 DIAGNOSIS — Z0000 Encounter for general adult medical examination without abnormal findings: Secondary | ICD-10-CM | POA: Diagnosis not present

## 2020-04-02 DIAGNOSIS — I340 Nonrheumatic mitral (valve) insufficiency: Secondary | ICD-10-CM | POA: Diagnosis not present

## 2020-04-02 DIAGNOSIS — Z7901 Long term (current) use of anticoagulants: Secondary | ICD-10-CM | POA: Diagnosis not present

## 2020-04-02 DIAGNOSIS — N1831 Chronic kidney disease, stage 3a: Secondary | ICD-10-CM | POA: Diagnosis not present

## 2020-04-02 DIAGNOSIS — I5042 Chronic combined systolic (congestive) and diastolic (congestive) heart failure: Secondary | ICD-10-CM | POA: Diagnosis not present

## 2020-04-02 DIAGNOSIS — I6529 Occlusion and stenosis of unspecified carotid artery: Secondary | ICD-10-CM | POA: Diagnosis not present

## 2020-04-02 DIAGNOSIS — I2510 Atherosclerotic heart disease of native coronary artery without angina pectoris: Secondary | ICD-10-CM | POA: Diagnosis not present

## 2020-04-02 DIAGNOSIS — Z23 Encounter for immunization: Secondary | ICD-10-CM | POA: Diagnosis not present

## 2020-04-02 DIAGNOSIS — I2721 Secondary pulmonary arterial hypertension: Secondary | ICD-10-CM | POA: Diagnosis not present

## 2020-04-02 DIAGNOSIS — D692 Other nonthrombocytopenic purpura: Secondary | ICD-10-CM | POA: Diagnosis not present

## 2020-04-02 DIAGNOSIS — E1122 Type 2 diabetes mellitus with diabetic chronic kidney disease: Secondary | ICD-10-CM | POA: Diagnosis not present

## 2020-04-02 DIAGNOSIS — E7849 Other hyperlipidemia: Secondary | ICD-10-CM | POA: Diagnosis not present

## 2020-05-12 DIAGNOSIS — Z23 Encounter for immunization: Secondary | ICD-10-CM | POA: Diagnosis not present

## 2020-09-05 ENCOUNTER — Other Ambulatory Visit (HOSPITAL_COMMUNITY): Payer: Self-pay | Admitting: Internal Medicine

## 2020-09-19 ENCOUNTER — Telehealth (HOSPITAL_COMMUNITY): Payer: Self-pay | Admitting: Internal Medicine

## 2020-09-19 NOTE — Telephone Encounter (Signed)
Pt request Losartan 25 MG refill, please send script to [facility], [address] , pt scheduled appt in April. Thanks

## 2020-09-20 MED ORDER — LOSARTAN POTASSIUM 25 MG PO TABS: ORAL_TABLET | ORAL | 0 refills | Status: DC

## 2020-10-02 DIAGNOSIS — E1122 Type 2 diabetes mellitus with diabetic chronic kidney disease: Secondary | ICD-10-CM | POA: Diagnosis not present

## 2020-10-02 DIAGNOSIS — I472 Ventricular tachycardia: Secondary | ICD-10-CM | POA: Diagnosis not present

## 2020-10-02 DIAGNOSIS — Z7901 Long term (current) use of anticoagulants: Secondary | ICD-10-CM | POA: Diagnosis not present

## 2020-10-02 DIAGNOSIS — E785 Hyperlipidemia, unspecified: Secondary | ICD-10-CM | POA: Diagnosis not present

## 2020-10-02 DIAGNOSIS — N1831 Chronic kidney disease, stage 3a: Secondary | ICD-10-CM | POA: Diagnosis not present

## 2020-10-02 DIAGNOSIS — E669 Obesity, unspecified: Secondary | ICD-10-CM | POA: Diagnosis not present

## 2020-10-02 DIAGNOSIS — I340 Nonrheumatic mitral (valve) insufficiency: Secondary | ICD-10-CM | POA: Diagnosis not present

## 2020-10-02 DIAGNOSIS — N401 Enlarged prostate with lower urinary tract symptoms: Secondary | ICD-10-CM | POA: Diagnosis not present

## 2020-10-02 DIAGNOSIS — I2721 Secondary pulmonary arterial hypertension: Secondary | ICD-10-CM | POA: Diagnosis not present

## 2020-10-02 DIAGNOSIS — R413 Other amnesia: Secondary | ICD-10-CM | POA: Diagnosis not present

## 2020-10-02 DIAGNOSIS — R82998 Other abnormal findings in urine: Secondary | ICD-10-CM | POA: Diagnosis not present

## 2020-10-02 DIAGNOSIS — I6990 Unspecified sequelae of unspecified cerebrovascular disease: Secondary | ICD-10-CM | POA: Diagnosis not present

## 2020-10-02 DIAGNOSIS — I2510 Atherosclerotic heart disease of native coronary artery without angina pectoris: Secondary | ICD-10-CM | POA: Diagnosis not present

## 2020-10-02 DIAGNOSIS — I5042 Chronic combined systolic (congestive) and diastolic (congestive) heart failure: Secondary | ICD-10-CM | POA: Diagnosis not present

## 2020-10-02 DIAGNOSIS — E039 Hypothyroidism, unspecified: Secondary | ICD-10-CM | POA: Diagnosis not present

## 2020-10-02 DIAGNOSIS — Z86718 Personal history of other venous thrombosis and embolism: Secondary | ICD-10-CM | POA: Diagnosis not present

## 2020-10-12 ENCOUNTER — Emergency Department (HOSPITAL_BASED_OUTPATIENT_CLINIC_OR_DEPARTMENT_OTHER): Payer: Medicare Other | Admitting: Radiology

## 2020-10-12 ENCOUNTER — Encounter (HOSPITAL_BASED_OUTPATIENT_CLINIC_OR_DEPARTMENT_OTHER): Payer: Self-pay

## 2020-10-12 ENCOUNTER — Other Ambulatory Visit: Payer: Self-pay

## 2020-10-12 ENCOUNTER — Inpatient Hospital Stay (HOSPITAL_BASED_OUTPATIENT_CLINIC_OR_DEPARTMENT_OTHER)
Admission: EM | Admit: 2020-10-12 | Discharge: 2020-10-14 | DRG: 309 | Disposition: A | Discharge status: Home / Self Care | Payer: Medicare Other | Attending: Internal Medicine | Admitting: Internal Medicine

## 2020-10-12 DIAGNOSIS — R059 Cough, unspecified: Secondary | ICD-10-CM | POA: Diagnosis present

## 2020-10-12 DIAGNOSIS — R6510 Systemic inflammatory response syndrome (SIRS) of non-infectious origin without acute organ dysfunction: Principal | ICD-10-CM | POA: Diagnosis present

## 2020-10-12 DIAGNOSIS — N400 Enlarged prostate without lower urinary tract symptoms: Secondary | ICD-10-CM | POA: Diagnosis not present

## 2020-10-12 DIAGNOSIS — Z8673 Personal history of transient ischemic attack (TIA), and cerebral infarction without residual deficits: Secondary | ICD-10-CM | POA: Diagnosis not present

## 2020-10-12 DIAGNOSIS — D72829 Elevated white blood cell count, unspecified: Secondary | ICD-10-CM | POA: Diagnosis not present

## 2020-10-12 DIAGNOSIS — G4733 Obstructive sleep apnea (adult) (pediatric): Secondary | ICD-10-CM | POA: Diagnosis not present

## 2020-10-12 DIAGNOSIS — I428 Other cardiomyopathies: Secondary | ICD-10-CM | POA: Diagnosis present

## 2020-10-12 DIAGNOSIS — Z86718 Personal history of other venous thrombosis and embolism: Secondary | ICD-10-CM | POA: Diagnosis not present

## 2020-10-12 DIAGNOSIS — Z888 Allergy status to other drugs, medicaments and biological substances status: Secondary | ICD-10-CM

## 2020-10-12 DIAGNOSIS — Z79899 Other long term (current) drug therapy: Secondary | ICD-10-CM | POA: Diagnosis not present

## 2020-10-12 DIAGNOSIS — N183 Chronic kidney disease, stage 3 unspecified: Secondary | ICD-10-CM | POA: Diagnosis present

## 2020-10-12 DIAGNOSIS — E785 Hyperlipidemia, unspecified: Secondary | ICD-10-CM | POA: Diagnosis present

## 2020-10-12 DIAGNOSIS — R000 Tachycardia, unspecified: Secondary | ICD-10-CM

## 2020-10-12 DIAGNOSIS — Z79890 Hormone replacement therapy (postmenopausal): Secondary | ICD-10-CM | POA: Diagnosis not present

## 2020-10-12 DIAGNOSIS — Z884 Allergy status to anesthetic agent status: Secondary | ICD-10-CM | POA: Diagnosis not present

## 2020-10-12 DIAGNOSIS — Z20822 Contact with and (suspected) exposure to covid-19: Secondary | ICD-10-CM | POA: Diagnosis not present

## 2020-10-12 DIAGNOSIS — Z7901 Long term (current) use of anticoagulants: Secondary | ICD-10-CM

## 2020-10-12 DIAGNOSIS — N1832 Chronic kidney disease, stage 3b: Secondary | ICD-10-CM | POA: Diagnosis not present

## 2020-10-12 DIAGNOSIS — Z9102 Food additives allergy status: Secondary | ICD-10-CM

## 2020-10-12 DIAGNOSIS — Z882 Allergy status to sulfonamides status: Secondary | ICD-10-CM

## 2020-10-12 DIAGNOSIS — E1122 Type 2 diabetes mellitus with diabetic chronic kidney disease: Secondary | ICD-10-CM | POA: Diagnosis not present

## 2020-10-12 DIAGNOSIS — E039 Hypothyroidism, unspecified: Secondary | ICD-10-CM | POA: Diagnosis present

## 2020-10-12 DIAGNOSIS — I5042 Chronic combined systolic (congestive) and diastolic (congestive) heart failure: Secondary | ICD-10-CM | POA: Diagnosis present

## 2020-10-12 DIAGNOSIS — N390 Urinary tract infection, site not specified: Secondary | ICD-10-CM

## 2020-10-12 DIAGNOSIS — I4891 Unspecified atrial fibrillation: Secondary | ICD-10-CM | POA: Diagnosis not present

## 2020-10-12 DIAGNOSIS — I447 Left bundle-branch block, unspecified: Secondary | ICD-10-CM | POA: Diagnosis not present

## 2020-10-12 DIAGNOSIS — I493 Ventricular premature depolarization: Secondary | ICD-10-CM | POA: Diagnosis not present

## 2020-10-12 HISTORY — DX: Urinary tract infection, site not specified: N39.0

## 2020-10-12 LAB — TROPONIN I (HIGH SENSITIVITY)
Troponin I (High Sensitivity): 29 ng/L — ABNORMAL HIGH (ref <18)
Troponin I (High Sensitivity): 48 ng/L — ABNORMAL HIGH (ref <18)

## 2020-10-12 LAB — CBC WITH DIFFERENTIAL/PLATELET
Abs Immature Granulocytes: 0.12 10*3/uL — ABNORMAL HIGH (ref 0.00–0.07)
Basophils Absolute: 0 10*3/uL (ref 0.0–0.1)
Basophils Relative: 0 %
Eosinophils Absolute: 0.2 10*3/uL (ref 0.0–0.5)
Eosinophils Relative: 1 %
HCT: 47.2 % (ref 39.0–52.0)
Hemoglobin: 15.7 g/dL (ref 13.0–17.0)
Immature Granulocytes: 1 %
Lymphocytes Relative: 5 %
Lymphs Abs: 0.9 10*3/uL (ref 0.7–4.0)
MCH: 27.4 pg (ref 26.0–34.0)
MCHC: 33.3 g/dL (ref 30.0–36.0)
MCV: 82.4 fL (ref 80.0–100.0)
Monocytes Absolute: 1.2 10*3/uL — ABNORMAL HIGH (ref 0.1–1.0)
Monocytes Relative: 7 %
Neutro Abs: 16 10*3/uL — ABNORMAL HIGH (ref 1.7–7.7)
Neutrophils Relative %: 86 %
Platelets: 196 10*3/uL (ref 150–400)
RBC: 5.73 MIL/uL (ref 4.22–5.81)
RDW: 13.3 % (ref 11.5–15.5)
WBC: 18.5 10*3/uL — ABNORMAL HIGH (ref 4.0–10.5)
nRBC: 0 % (ref 0.0–0.2)

## 2020-10-12 LAB — BASIC METABOLIC PANEL
Anion gap: 15 (ref 5–15)
BUN: 28 mg/dL — ABNORMAL HIGH (ref 8–23)
CO2: 21 mmol/L — ABNORMAL LOW (ref 22–32)
Calcium: 9.2 mg/dL (ref 8.9–10.3)
Chloride: 97 mmol/L — ABNORMAL LOW (ref 98–111)
Creatinine, Ser: 1.55 mg/dL — ABNORMAL HIGH (ref 0.61–1.24)
GFR, Estimated: 44 mL/min — ABNORMAL LOW (ref >60)
Glucose, Bld: 222 mg/dL — ABNORMAL HIGH (ref 70–99)
Potassium: 4.5 mmol/L (ref 3.5–5.1)
Sodium: 133 mmol/L — ABNORMAL LOW (ref 135–145)

## 2020-10-12 LAB — PROTIME-INR
INR: 1.2 (ref 0.8–1.2)
Prothrombin Time: 14.3 seconds (ref 11.4–15.2)

## 2020-10-12 LAB — URINALYSIS, ROUTINE W REFLEX MICROSCOPIC
Bilirubin Urine: NEGATIVE
Glucose, UA: NEGATIVE mg/dL
Hgb urine dipstick: NEGATIVE
Ketones, ur: NEGATIVE mg/dL
Leukocytes,Ua: NEGATIVE
Nitrite: NEGATIVE
Specific Gravity, Urine: 1.017 (ref 1.005–1.030)
pH: 5 (ref 5.0–8.0)

## 2020-10-12 LAB — RESP PANEL BY RT-PCR (FLU A&B, COVID) ARPGX2
Influenza A by PCR: NEGATIVE
Influenza B by PCR: NEGATIVE
SARS Coronavirus 2 by RT PCR: NEGATIVE

## 2020-10-12 LAB — MAGNESIUM: Magnesium: 1.9 mg/dL (ref 1.7–2.4)

## 2020-10-12 LAB — LACTIC ACID, PLASMA
Lactic Acid, Venous: 2.5 mmol/L (ref 0.5–1.9)
Lactic Acid, Venous: 2.3 mmol/L (ref 0.5–1.9)

## 2020-10-12 LAB — BRAIN NATRIURETIC PEPTIDE: B Natriuretic Peptide: 264.7 pg/mL — ABNORMAL HIGH (ref 0.0–100.0)

## 2020-10-12 MED ORDER — ONDANSETRON HCL 4 MG PO TABS: 4.0000 mg | ORAL_TABLET | Freq: Four times a day (QID) | ORAL | Status: DC | PRN

## 2020-10-12 MED ORDER — APIXABAN 2.5 MG PO TABS
2.5000 mg | ORAL_TABLET | Freq: Two times a day (BID) | ORAL | Status: DC
  Administered 2020-10-12 – 2020-10-14 (×4): 2.5 mg via ORAL
  Filled 2020-10-12 (×4): qty 1

## 2020-10-12 MED ORDER — ACETAMINOPHEN 325 MG PO TABS: 650.0000 mg | ORAL_TABLET | Freq: Four times a day (QID) | ORAL | Status: DC | PRN

## 2020-10-12 MED ORDER — FINASTERIDE 5 MG PO TABS
5.0000 mg | ORAL_TABLET | Freq: Every day | ORAL | Status: DC
  Administered 2020-10-13 – 2020-10-14 (×2): 5 mg via ORAL
  Filled 2020-10-12 (×2): qty 1

## 2020-10-12 MED ORDER — SODIUM CHLORIDE 0.9 % IV SOLN
2.0000 g | Freq: Once | INTRAVENOUS | Status: AC
  Administered 2020-10-12: 2 g via INTRAVENOUS
  Filled 2020-10-12: qty 2

## 2020-10-12 MED ORDER — LEVOTHYROXINE SODIUM 50 MCG PO TABS
75.0000 ug | ORAL_TABLET | Freq: Every day | ORAL | Status: DC
  Administered 2020-10-13 – 2020-10-14 (×2): 75 ug via ORAL
  Filled 2020-10-12 (×2): qty 1

## 2020-10-12 MED ORDER — POLYETHYLENE GLYCOL 3350 17 G PO PACK: 17.0000 g | PACK | Freq: Every day | ORAL | Status: DC | PRN

## 2020-10-12 MED ORDER — AMIODARONE HCL 100 MG PO TABS
100.0000 mg | ORAL_TABLET | Freq: Every day | ORAL | Status: DC
  Administered 2020-10-13 – 2020-10-14 (×2): 100 mg via ORAL
  Filled 2020-10-12 (×2): qty 1

## 2020-10-12 MED ORDER — SODIUM CHLORIDE 0.9 % IV SOLN
1.0000 g | INTRAVENOUS | Status: DC
  Administered 2020-10-13 – 2020-10-14 (×2): 1 g via INTRAVENOUS
  Filled 2020-10-12 (×2): qty 1

## 2020-10-12 MED ORDER — ACETAMINOPHEN 650 MG RE SUPP: 650.0000 mg | Freq: Four times a day (QID) | RECTAL | Status: DC | PRN

## 2020-10-12 MED ORDER — LEVOTHYROXINE SODIUM 50 MCG PO TABS: 50.0000 ug | ORAL_TABLET | Freq: Every day | ORAL | Status: DC

## 2020-10-12 MED ORDER — ONDANSETRON HCL 4 MG/2ML IJ SOLN: 4.0000 mg | Freq: Four times a day (QID) | INTRAMUSCULAR | Status: DC | PRN

## 2020-10-12 MED ORDER — ACETAMINOPHEN 500 MG PO TABS
1000.0000 mg | ORAL_TABLET | Freq: Once | ORAL | Status: AC
  Administered 2020-10-12: 1000 mg via ORAL
  Filled 2020-10-12: qty 2

## 2020-10-12 NOTE — ED Notes (Signed)
ED Provider at bedside. 

## 2020-10-12 NOTE — ED Provider Notes (Signed)
Michael Randolph Provider Note   CSN: [id number] Arrival date & time: 10/12/20  1533     History Chief Complaint  Patient presents with  . Irregular Heart Beat    Michael Randolph is a 86 y.o. male.  Presenting to ER with concern for elevated heart rate.  Wife reports that she monitors his heart rate judiciously.  This morning noted the heart rate was slightly more elevated than it is normally.  Patient states that he had slight shaking in his hands but otherwise denies any complaints.  He was recently treated with a UTI with Macrobid.  States he is on his last day of antibiotics today.  Per chart review has history of heart failure, nonischemic cardiomyopathy, stroke, DVT, diabetes  HPI     Past Medical History:  Diagnosis Date  . Congestive heart failure (CHF) ([place])   . Diabetes mellitus without complication (HCC)    no medications  . Diverticulosis   . DVT (deep venous thrombosis) ([place])   . Heart disease   . Hyperlipemia   . Kidney stone   . NICM (nonischemic cardiomyopathy) ([place])   . OSA (obstructive sleep apnea)   . Stroke ([place])   . TIA (transient ischemic attack) 2008   was seen on CT   . Toe infection     Patient Active Problem List   Diagnosis Date Noted  . Acute delirium 08/09/2017  . NICM (nonischemic cardiomyopathy) ([place]) 08/09/2017  . Acute lower UTI 08/09/2017  . Frequent PVCs   . Chronic combined systolic and diastolic CHF (congestive heart failure) ([place]) 07/25/2017  . Mixed hyperlipidemia 03/11/2017  . NSVT (nonsustained ventricular tachycardia) ([place]) 05/19/2016  . Acute on chronic systolic (congestive) heart failure ([place]) 05/19/2016  . Sepsis ([place]) 05/16/2016  . CAP (community acquired pneumonia) 05/16/2016  . AKI (acute kidney injury) ([place]) 05/16/2016  . Elevated troponin   . NSTEMI (non-ST elevated myocardial infarction) ([place])   . Diabetes mellitus without complication ([place]) 51/88/4166  . DVT (deep venous thrombosis)  ([place])   . TIA (transient ischemic attack)     Past Surgical History:  Procedure Laterality Date  . CARDIAC CATHETERIZATION N/A 12/01/2014   Procedure: Right/Left Heart Cath and Coronary Angiography;  Surgeon: Jettie Booze, MD;  Location: [facility];  Service: Cardiovascular;  Laterality: N/A;  . COLONOSCOPY  2013  . ROTATOR CUFF REPAIR Right        Family History  Problem Relation Age of Onset  . Stroke Mother   . Hypertension Mother   . Heart attack Father   . Deep vein thrombosis Father   . Crohn's disease Brother   . Schizophrenia Sister     Social History   Tobacco Use  . Smoking status: Never Smoker  . Smokeless tobacco: Never Used  Vaping Use  . Vaping Use: Never used  Substance Use Topics  . Alcohol use: No    Alcohol/week: 0.0 standard drinks  . Drug use: No    Home Medications Prior to Admission medications   Medication Sig Start Date End Date Taking? Authorizing Provider  amiodarone (PACERONE) 200 MG tablet Take 1/2 (one-half) tablet by mouth once daily 12/23/19   Bensimhon, Shaune Pascal, MD  apixaban (ELIQUIS) 2.5 MG TABS tablet Take 2.5 mg by mouth 2 (two) times daily.    [provider]  Cholecalciferol 2000 UNITS CAPS Take 2,000 Units by mouth 2 (two) times daily.     [provider]  finasteride (PROSCAR) 5 MG tablet  Take 5 mg by mouth daily.    [provider]  GARLIC PO Take 1 tablet by mouth daily.    [provider]  levothyroxine (SYNTHROID) 50 MCG tablet Take 50 mcg by mouth daily before breakfast.    [provider]  losartan (COZAAR) 25 MG tablet Take 1/2 (one-half) tablet by mouth once daily 09/20/20   Bensimhon, Shaune Pascal, MD  MAGNESIUM PO Take 1 tablet by mouth 2 (two) times daily.     [provider]  Methylsulfonylmethane (MSM) 1000 MG CAPS Take 1,000 mg by mouth daily.     [provider]  nitrofurantoin (MACRODANTIN) 100 MG capsule Take 100 mg by mouth 2 (two) times  daily. 10/05/20   [provider]  Probiotic Product (PROBIOTIC-10 PO) Take 1 tablet by mouth daily.    [provider]  sodium chloride (OCEAN) 0.65 % SOLN nasal spray Place 1 spray into both nostrils as needed for congestion.    [provider]  spironolactone (ALDACTONE) 25 MG tablet Take 0.5 tablets (12.5 mg total) by mouth daily. NEEDS APPOINTMENT FOR FUTURE REFILLS 09/05/20   Bensimhon, Shaune Pascal, MD  torsemide (DEMADEX) 20 MG tablet Take 1 tablet (20 mg total) by mouth daily. NEEDS APPOINTMENT FOR FUTURE REFILS 09/05/20   Bensimhon, Shaune Pascal, MD    Allergies    Actos [pioglitazone]; Anesthetics, halogenated; Crestor [rosuvastatin calcium]; Sulfa antibiotics; and Dye fdc red [red dye]  Review of Systems   Review of Systems  Constitutional: Negative for chills and fever.  HENT: Negative for ear pain and sore throat.   Eyes: Negative for pain and visual disturbance.  Respiratory: Negative for cough and shortness of breath.   Cardiovascular: Positive for palpitations. Negative for chest pain.  Gastrointestinal: Negative for abdominal pain and vomiting.  Genitourinary: Negative for dysuria and hematuria.  Musculoskeletal: Negative for arthralgias and back pain.  Skin: Negative for color change and rash.  Neurological: Negative for seizures and syncope.  All other systems reviewed and are negative.   Physical Exam Updated Vital Signs BP 113/67   Pulse 94   Temp (!) 100.4 F (38 C)   Resp 16   Ht 5\' 6"  (1.676 m)   Wt 96.5 kg   SpO2 95%   BMI 34.33 kg/m   Physical Exam Vitals and nursing note reviewed.  Constitutional:      Appearance: He is well-developed.  HENT:     Head: Normocephalic and atraumatic.  Eyes:     Conjunctiva/sclera: Conjunctivae normal.  Cardiovascular:     Rate and Rhythm: Tachycardia present. Rhythm irregular.     Heart sounds: No murmur heard.   Pulmonary:     Effort: Pulmonary effort is normal. No respiratory distress.      Breath sounds: Normal breath sounds.  Abdominal:     Palpations: Abdomen is soft.     Tenderness: There is no abdominal tenderness.  Musculoskeletal:        General: No deformity or signs of injury.     Cervical back: Neck supple.  Skin:    General: Skin is warm and dry.  Neurological:     General: No focal deficit present.     Mental Status: He is alert.  Psychiatric:        Mood and Affect: Mood normal.     ED Results / Procedures / Treatments   Labs (all labs ordered are listed, but only abnormal results are displayed) Labs Reviewed  CBC WITH DIFFERENTIAL/PLATELET - Abnormal; Notable for  the following components:      Result Value   WBC 18.5 (*)    Neutro Abs 16.0 (*)    Monocytes Absolute 1.2 (*)    Abs Immature Granulocytes 0.12 (*)    All other components within normal limits  BASIC METABOLIC PANEL - Abnormal; Notable for the following components:   Sodium 133 (*)    Chloride 97 (*)    CO2 21 (*)    Glucose, Bld 222 (*)    BUN 28 (*)    Creatinine, Ser 1.55 (*)    GFR, Estimated 44 (*)    All other components within normal limits  BRAIN NATRIURETIC PEPTIDE - Abnormal; Notable for the following components:   B Natriuretic Peptide 264.7 (*)    All other components within normal limits  LACTIC ACID, PLASMA - Abnormal; Notable for the following components:   Lactic Acid, Venous 2.5 (*)    All other components within normal limits  URINALYSIS, ROUTINE W REFLEX MICROSCOPIC - Abnormal; Notable for the following components:   Protein, ur TRACE (*)    All other components within normal limits  TROPONIN I (HIGH SENSITIVITY) - Abnormal; Notable for the following components:   Troponin I (High Sensitivity) 29 (*)    All other components within normal limits  RESP PANEL BY RT-PCR (FLU A&B, COVID) ARPGX2  CULTURE, BLOOD (ROUTINE X 2)  CULTURE, BLOOD (ROUTINE X 2)  URINE CULTURE  PROTIME-INR  MAGNESIUM  LACTIC ACID, PLASMA  TROPONIN I (HIGH SENSITIVITY)     EKG None  Radiology DG Chest 1 View  Result Date: 10/12/2020 CLINICAL DATA:  Atrial fibrillation. EXAM: CHEST  1 VIEW COMPARISON:  08/09/2017 FINDINGS: Single view of the chest demonstrates clear lungs. Heart and mediastinum are within normal limits. Negative for pulmonary edema. Trachea is midline. Degenerative changes in both shoulders. IMPRESSION: No acute chest abnormality. Electronically Signed   By: Markus Daft M.D.   On: 10/12/2020 17:07    Procedures Procedures   Medications Ordered in ED Medications  ceFEPIme (MAXIPIME) 2 g in sodium chloride 0.9 % 100 mL IVPB (has no administration in time range)  acetaminophen (TYLENOL) tablet 1,000 mg (1,000 mg Oral Given 10/12/20 1629)    ED Course  I have reviewed the triage vital signs and the nursing notes.  Pertinent labs & imaging results that were available during my care of the patient were reviewed by me and considered in my medical decision making (see chart for details).    MDM Rules/Calculators/A&P                          86 year old male history of stroke, diabetes, nonischemic cardiomyopathy (heart failure EF 20%) presenting to ER with concern for elevated heart rate.  On exam patient was well-appearing in no distress.  He was noted to be tachycardic, febrile.  Suspect fever is reason for his elevated heart rate.  Commenced broad infectious work-up.  Covid negative, CXR negative, UA negative.  Does endorse recent treatment for UTI.  Labs noted for significant leukocytosis as well as a slight elevation in lactic acid.  Given his severe heart failure history, not hypotensive, do not feel he would benefit from fluid bolus at present.  Given leukocytosis, elevation lactic acid, believe he would benefit from further observation and empiric IV antibiotics.  Will start cefepime.  Will consult medicine for admission.  Final Clinical Impression(s) / ED Diagnoses Final diagnoses:  SIRS (systemic inflammatory response syndrome) ([place])  Leukocytosis, unspecified type  Tachycardia    Rx / DC Orders ED Discharge Orders    None       Lucrezia Starch, MD 10/12/20 [phone]

## 2020-10-12 NOTE — ED Notes (Addendum)
Attempt to call report to RN x3 unsuccessfully for room 1436. RN states she is in critical situation with other PT and is unable to take report. This RN called back after 15 mins and asked to be forwarded to charge nurse and was on hold for 10 mins and was unable to speak to charge RN at this time. Care link present and initiated transport. Receiving RN given number to call back for report when able.

## 2020-10-12 NOTE — H&P (Signed)
History and Physical    Michael Randolph YYT:[id number] DOB: [birth year] DOA: 10/12/2020  PCP: Shon Baton, MD  Patient coming from: [place] ED  I have personally briefly reviewed patient's old medical records in [place]  Chief Complaint: Elevated heart rate  HPI: Michael Randolph is a 86 y.o. male with medical history significant for chronic combined systolic and diastolic CHF (last EF 27-51%), NICM, frequent PVCs on amiodarone, history of DVT on Eliquis, LBBB, history of CVA, HLD, CKD stage III, hypothyroidism, and BPH who self catheterizes who presented to [facility] ED for evaluation of rapid heart rate.  Patient states he has been self catheterizing for the last 2 years.  He says about a week ago his primary doctor started him on Macrobid for UTI prophylaxis.  He says this morning he has some chills and some shaking in his right hand.  He checked his blood pressure several times this morning and noticed his heart rate was higher than usual, upper 90s to low 100s.  He then came to the ED for further evaluation as his heart rate is normally on the borderline bradycardic side.  He otherwise denies any subjective fevers, diaphoresis, nausea, vomiting, chest pain, dyspnea, abdominal pain, dysuria, diarrhea, body aches, or lower extremity swelling.  He has not noticed any skin infections or wounds.  He reports a chronic occasional cough which he attributes to postnasal drip/allergies.  He says he has not had any other recent medication changes other than starting Macrobid.  He denies any sick contacts.  [place] ED Course:  Initial vitals showed BP 123/91, pulse 113, RR 103, temp 102.8 F, SPO2 94% on room air.  Labs show WBC 18.5, hemoglobin 15.7, platelets 196,000, sodium 133, potassium 4.5, bicarb 21, BUN 28, creatinine 1.55, serum glucose 222, high-sensitivity troponin I 29 > 48, lactic acid 2.5 > 2.3, magnesium 1.9, BNP 260.7.  Urinalysis shows negative  nitrates, negative leukocytes.  Blood and urine cultures obtained and pending.  SARS-CoV-2 PCR negative.  Influenza A/B PCR negative.  Portable chest x-ray without focal consolidation, edema, or effusion.  Patient was given 1 g Tylenol and IV cefepime.  The hospitalist service was consulted to admit for further evaluation management.  Review of Systems: All systems reviewed and are negative except as documented in history of present illness above.   Past Medical History:  Diagnosis Date  . Congestive heart failure (CHF) ([place])   . Diabetes mellitus without complication (HCC)    no medications  . Diverticulosis   . DVT (deep venous thrombosis) ([place])   . Heart disease   . Hyperlipemia   . Kidney stone   . NICM (nonischemic cardiomyopathy) ([place])   . OSA (obstructive sleep apnea)   . Stroke ([place])   . TIA (transient ischemic attack) 2008   was seen on CT   . Toe infection     Past Surgical History:  Procedure Laterality Date  . CARDIAC CATHETERIZATION N/A 12/01/2014   Procedure: Right/Left Heart Cath and Coronary Angiography;  Surgeon: Jettie Booze, MD;  Location: [facility] [facility];  Service: Cardiovascular;  Laterality: N/A;  . COLONOSCOPY  2013  . ROTATOR CUFF REPAIR Right     Social History:  reports that he has never smoked. He has never used smokeless tobacco. He reports that he does not drink alcohol and does not use drugs.  Allergies  Allergen Reactions  . Actos [Pioglitazone] Other (See Comments)    Slow HR and pain in  face   . Anesthetics, Halogenated Other (See Comments)    Slept too long  . Crestor [Rosuvastatin Calcium]     Pain   . Sulfa Antibiotics Other (See Comments)    Hallucinations   . Dye Fdc Red [Red Dye] Rash    # 53    Family History  Problem Relation Age of Onset  . Stroke Mother   . Hypertension Mother   . Heart attack Father   . Deep vein thrombosis Father   . Crohn's disease Brother   . Schizophrenia Sister      Prior to  Admission medications   Medication Sig Start Date End Date Taking? Authorizing Provider  amiodarone (PACERONE) 200 MG tablet Take 1/2 (one-half) tablet by mouth once daily 12/23/19   Bensimhon, Shaune Pascal, MD  apixaban (ELIQUIS) 2.5 MG TABS tablet Take 2.5 mg by mouth 2 (two) times daily.    [provider]  Cholecalciferol 2000 UNITS CAPS Take 2,000 Units by mouth 2 (two) times daily.     [provider]  finasteride (PROSCAR) 5 MG tablet Take 5 mg by mouth daily.    [provider]  GARLIC PO Take 1 tablet by mouth daily.    [provider]  levothyroxine (SYNTHROID) 50 MCG tablet Take 50 mcg by mouth daily before breakfast.    [provider]  losartan (COZAAR) 25 MG tablet Take 1/2 (one-half) tablet by mouth once daily 09/20/20   Bensimhon, Shaune Pascal, MD  MAGNESIUM PO Take 1 tablet by mouth 2 (two) times daily.     [provider]  Methylsulfonylmethane (MSM) 1000 MG CAPS Take 1,000 mg by mouth daily.     [provider]  nitrofurantoin (MACRODANTIN) 100 MG capsule Take 100 mg by mouth 2 (two) times daily. 10/05/20   [provider]  Probiotic Product (PROBIOTIC-10 PO) Take 1 tablet by mouth daily.    [provider]  sodium chloride (OCEAN) 0.65 % SOLN nasal spray Place 1 spray into both nostrils as needed for congestion.    [provider]  spironolactone (ALDACTONE) 25 MG tablet Take 0.5 tablets (12.5 mg total) by mouth daily. NEEDS APPOINTMENT FOR FUTURE REFILLS 09/05/20   Bensimhon, Shaune Pascal, MD  torsemide (DEMADEX) 20 MG tablet Take 1 tablet (20 mg total) by mouth daily. NEEDS APPOINTMENT FOR FUTURE REFILS 09/05/20   Bensimhon, Shaune Pascal, MD    Physical Exam: Vitals:   10/12/20 2000 10/12/20 2010 10/12/20 2015 10/12/20 2112  BP:  107/61  128/74  Pulse: 83 81 84 80  Resp: (!) 24 19 (!) 26 19  Temp:    98.5 F (36.9 C)  TempSrc:    Oral  SpO2: 97% 98% 96% 94%  Weight:      Height:        Constitutional: Resting supine in bed, NAD, calm, comfortable Eyes: PERRL, lids and conjunctivae normal ENMT: Mucous membranes are moist. Posterior pharynx clear of any exudate or lesions.Normal dentition.  Neck: normal, supple, no masses. Respiratory: clear to auscultation bilaterally, no wheezing, no crackles. Normal respiratory effort. No accessory muscle use.  Cardiovascular: Regular rate and rhythm, no murmurs / rubs / gallops. No extremity edema. 2+ pedal pulses. Abdomen: no tenderness, no masses palpated. No hepatosplenomegaly. Bowel sounds positive.  Musculoskeletal: no clubbing / cyanosis. No joint deformity upper and lower extremities. Good ROM, no contractures. Normal muscle tone.  Skin: Unkept toenails otherwise no rashes, lesions, ulcers. No induration Neurologic: CN 2-12 grossly intact. Sensation intact. Strength 5/5  in all 4.  Psychiatric: Normal judgment and insight. Alert and oriented x 3. Normal mood.    Labs on Admission: I have personally reviewed following labs and imaging studies  CBC: Recent Labs  Lab 10/12/20 1614  WBC 18.5*  NEUTROABS 16.0*  HGB 15.7  HCT 47.2  MCV 82.4  PLT 660   Basic Metabolic Panel: Recent Labs  Lab 10/12/20 1614  NA 133*  K 4.5  CL 97*  CO2 21*  GLUCOSE 222*  BUN 28*  CREATININE 1.55*  CALCIUM 9.2  MG 1.9   GFR: Estimated Creatinine Clearance: 39.3 mL/min (A) (by C-G formula based on SCr of 1.55 mg/dL (H)). Liver Function Tests: No results for input(s): AST, ALT, ALKPHOS, BILITOT, PROT, ALBUMIN in the last 168 hours. No results for input(s): LIPASE, AMYLASE in the last 168 hours. No results for input(s): AMMONIA in the last 168 hours. Coagulation Profile: Recent Labs  Lab 10/12/20 1614  INR 1.2   Cardiac Enzymes: No results for input(s): CKTOTAL, CKMB, CKMBINDEX, TROPONINI in the last 168 hours. BNP (last 3 results) No results for input(s): PROBNP in the last 8760 hours. HbA1C: No results for input(s): HGBA1C  in the last 72 hours. CBG: No results for input(s): GLUCAP in the last 168 hours. Lipid Profile: No results for input(s): CHOL, HDL, LDLCALC, TRIG, CHOLHDL, LDLDIRECT in the last 72 hours. Thyroid Function Tests: No results for input(s): TSH, T4TOTAL, FREET4, T3FREE, THYROIDAB in the last 72 hours. Anemia Panel: No results for input(s): VITAMINB12, FOLATE, FERRITIN, TIBC, IRON, RETICCTPCT in the last 72 hours. Urine analysis:    Component Value Date/Time   COLORURINE YELLOW 10/12/2020 1732   APPEARANCEUR CLEAR 10/12/2020 1732   LABSPEC 1.017 10/12/2020 1732   PHURINE 5.0 10/12/2020 1732   GLUCOSEU NEGATIVE 10/12/2020 1732   GLUCOSEU NEGATIVE 01/01/2015 1130   HGBUR NEGATIVE 10/12/2020 1732   BILIRUBINUR NEGATIVE 10/12/2020 1732   KETONESUR NEGATIVE 10/12/2020 1732   PROTEINUR TRACE (A) 10/12/2020 1732   UROBILINOGEN 0.2 01/01/2015 1130   NITRITE NEGATIVE 10/12/2020 1732   LEUKOCYTESUR NEGATIVE 10/12/2020 1732    Radiological Exams on Admission: DG Chest 1 View  Result Date: 10/12/2020 CLINICAL DATA:  Atrial fibrillation. EXAM: CHEST  1 VIEW COMPARISON:  08/09/2017 FINDINGS: Single view of the chest demonstrates clear lungs. Heart and mediastinum are within normal limits. Negative for pulmonary edema. Trachea is midline. Degenerative changes in both shoulders. IMPRESSION: No acute chest abnormality. Electronically Signed   By: Markus Daft M.D.   On: 10/12/2020 17:07    EKG: Personally reviewed. Atrial tachycardia, rate 115, LBBB.  Rate is faster when compared to prior which showed sinus bradycardia with first-degree AV block, LBBB.  Assessment/Plan Active Problems:   SIRS (systemic inflammatory response syndrome) (HCC)   Michael Randolph is a 86 y.o. male with medical history significant for chronic combined systolic and diastolic CHF (last EF 63-01%), NICM, frequent PVCs on amiodarone, history of DVT on Eliquis, LBBB, history of CVA, HLD, CKD stage III, hypothyroidism, and BPH  who self catheterizes who is admitted with SIRS/febrile illness.  SIRS/febrile illness: Patient presented with leukocytosis, WBC 18.5, fever up to 1 to 2.8 F, tachycardia with pulse up to 113, tachypnea, and lactic acid 2.5.  No obvious infectious source although most likely risk for UTI/cystitis given self-catheterization.  Urinalysis today could be sterile from recent antibiotic use.  CXR without evidence of pneumonia.  Denies any other obvious infectious symptoms. -Follow blood and urine cultures -Keep on empiric IV ceftriaxone  Chronic combined systolic and diastolic CHF: Chronic stable.  Appears euvolemic.  Holding losartan, spironolactone, torsemide tonight.  Resume tomorrow if vitals remain stable.  History of frequent PVCs: Continue amiodarone.  History of DVT: Continue Eliquis.  CKD stage III: Chronic and appears stable.  Hypothyroidism: Continue Synthroid.  BPH: Continue finasteride.  DVT prophylaxis: Eliquis Code Status: Full code, confirmed with patient Family Communication: Discussed with patient, he has discussed with family Disposition Plan: From home and likely discharge to home pending clinical progress Consults called: None Level of care: Telemetry Admission status:  Status is: Observation  The patient remains OBS appropriate and will d/c before 2 midnights.  Dispo: The patient is from: Home              Anticipated d/c is to: Home              Patient currently is not medically stable to d/c.   Difficult to place patient No  Zada Finders MD [facility]  If 7PM-7AM, please contact night-coverage [email or web address]  10/12/2020, 9:18 PM

## 2020-10-12 NOTE — ED Triage Notes (Signed)
Pt reports here for Afib and has know CHF, but never had hx of Afib per wife. Wife called Dr [place] [employer] office with no call back. Wife concerned and brought Pt in the ED for evaluation.

## 2020-10-13 DIAGNOSIS — E1122 Type 2 diabetes mellitus with diabetic chronic kidney disease: Secondary | ICD-10-CM | POA: Diagnosis present

## 2020-10-13 DIAGNOSIS — R000 Tachycardia, unspecified: Secondary | ICD-10-CM | POA: Diagnosis not present

## 2020-10-13 DIAGNOSIS — R059 Cough, unspecified: Secondary | ICD-10-CM | POA: Diagnosis not present

## 2020-10-13 DIAGNOSIS — Z79899 Other long term (current) drug therapy: Secondary | ICD-10-CM | POA: Diagnosis not present

## 2020-10-13 DIAGNOSIS — Z79890 Hormone replacement therapy (postmenopausal): Secondary | ICD-10-CM | POA: Diagnosis not present

## 2020-10-13 DIAGNOSIS — I5042 Chronic combined systolic (congestive) and diastolic (congestive) heart failure: Secondary | ICD-10-CM | POA: Diagnosis present

## 2020-10-13 DIAGNOSIS — Z888 Allergy status to other drugs, medicaments and biological substances status: Secondary | ICD-10-CM | POA: Diagnosis not present

## 2020-10-13 DIAGNOSIS — Z20822 Contact with and (suspected) exposure to covid-19: Secondary | ICD-10-CM | POA: Diagnosis present

## 2020-10-13 DIAGNOSIS — N1832 Chronic kidney disease, stage 3b: Secondary | ICD-10-CM | POA: Diagnosis present

## 2020-10-13 DIAGNOSIS — Z884 Allergy status to anesthetic agent status: Secondary | ICD-10-CM | POA: Diagnosis not present

## 2020-10-13 DIAGNOSIS — N400 Enlarged prostate without lower urinary tract symptoms: Secondary | ICD-10-CM | POA: Diagnosis present

## 2020-10-13 DIAGNOSIS — Z86718 Personal history of other venous thrombosis and embolism: Secondary | ICD-10-CM | POA: Diagnosis not present

## 2020-10-13 DIAGNOSIS — R6510 Systemic inflammatory response syndrome (SIRS) of non-infectious origin without acute organ dysfunction: Secondary | ICD-10-CM | POA: Diagnosis not present

## 2020-10-13 DIAGNOSIS — Z9102 Food additives allergy status: Secondary | ICD-10-CM | POA: Diagnosis not present

## 2020-10-13 DIAGNOSIS — Z7901 Long term (current) use of anticoagulants: Secondary | ICD-10-CM | POA: Diagnosis not present

## 2020-10-13 DIAGNOSIS — I447 Left bundle-branch block, unspecified: Secondary | ICD-10-CM | POA: Diagnosis present

## 2020-10-13 DIAGNOSIS — D72829 Elevated white blood cell count, unspecified: Secondary | ICD-10-CM | POA: Diagnosis not present

## 2020-10-13 DIAGNOSIS — I493 Ventricular premature depolarization: Secondary | ICD-10-CM | POA: Diagnosis present

## 2020-10-13 DIAGNOSIS — E785 Hyperlipidemia, unspecified: Secondary | ICD-10-CM | POA: Diagnosis present

## 2020-10-13 DIAGNOSIS — E039 Hypothyroidism, unspecified: Secondary | ICD-10-CM | POA: Diagnosis present

## 2020-10-13 DIAGNOSIS — G4733 Obstructive sleep apnea (adult) (pediatric): Secondary | ICD-10-CM | POA: Diagnosis present

## 2020-10-13 DIAGNOSIS — I428 Other cardiomyopathies: Secondary | ICD-10-CM | POA: Diagnosis present

## 2020-10-13 DIAGNOSIS — Z8673 Personal history of transient ischemic attack (TIA), and cerebral infarction without residual deficits: Secondary | ICD-10-CM | POA: Diagnosis not present

## 2020-10-13 DIAGNOSIS — Z882 Allergy status to sulfonamides status: Secondary | ICD-10-CM | POA: Diagnosis not present

## 2020-10-13 LAB — CBC
HCT: 45.4 % (ref 39.0–52.0)
Hemoglobin: 14.8 g/dL (ref 13.0–17.0)
MCH: 27.4 pg (ref 26.0–34.0)
MCHC: 32.6 g/dL (ref 30.0–36.0)
MCV: 84.1 fL (ref 80.0–100.0)
Platelets: 185 10*3/uL (ref 150–400)
RBC: 5.4 MIL/uL (ref 4.22–5.81)
RDW: 13.3 % (ref 11.5–15.5)
WBC: 17 10*3/uL — ABNORMAL HIGH (ref 4.0–10.5)
nRBC: 0 % (ref 0.0–0.2)

## 2020-10-13 LAB — BASIC METABOLIC PANEL
Anion gap: 10 (ref 5–15)
BUN: 28 mg/dL — ABNORMAL HIGH (ref 8–23)
CO2: 26 mmol/L (ref 22–32)
Calcium: 9 mg/dL (ref 8.9–10.3)
Chloride: 100 mmol/L (ref 98–111)
Creatinine, Ser: 1.8 mg/dL — ABNORMAL HIGH (ref 0.61–1.24)
GFR, Estimated: 37 mL/min — ABNORMAL LOW (ref >60)
Glucose, Bld: 172 mg/dL — ABNORMAL HIGH (ref 70–99)
Potassium: 4.1 mmol/L (ref 3.5–5.1)
Sodium: 136 mmol/L (ref 135–145)

## 2020-10-13 LAB — HEMOGLOBIN A1C
Hgb A1c MFr Bld: 6 % — ABNORMAL HIGH (ref 4.8–5.6)
Mean Plasma Glucose: 125.5 mg/dL

## 2020-10-13 LAB — TROPONIN I (HIGH SENSITIVITY): Troponin I (High Sensitivity): 70 ng/L — ABNORMAL HIGH (ref <18)

## 2020-10-13 LAB — TSH: TSH: 1.126 u[IU]/mL (ref 0.350–4.500)

## 2020-10-13 MED ORDER — LOSARTAN POTASSIUM 25 MG PO TABS
12.5000 mg | ORAL_TABLET | Freq: Every day | ORAL | Status: DC
  Administered 2020-10-13 – 2020-10-14 (×2): 12.5 mg via ORAL
  Filled 2020-10-13 (×2): qty 1

## 2020-10-13 MED ORDER — TORSEMIDE 20 MG PO TABS
20.0000 mg | ORAL_TABLET | Freq: Every day | ORAL | Status: DC
  Administered 2020-10-13 – 2020-10-14 (×2): 20 mg via ORAL
  Filled 2020-10-13 (×2): qty 1

## 2020-10-13 MED ORDER — SPIRONOLACTONE 12.5 MG HALF TABLET
12.5000 mg | ORAL_TABLET | Freq: Every day | ORAL | Status: DC
  Administered 2020-10-13 – 2020-10-14 (×2): 12.5 mg via ORAL
  Filled 2020-10-13 (×2): qty 1

## 2020-10-13 NOTE — Progress Notes (Signed)
Physician notified of troponin of 70.

## 2020-10-13 NOTE — Progress Notes (Signed)
PROGRESS NOTE    Michael Randolph  PFX:[id number] DOB: [birth year] DOA: 10/12/2020 PCP: Shon Baton, MD    Brief Narrative:  Michael Randolph  is a 86 y.o. male with medical history significant for chronic combined systolic and diastolic CHF (last EF 32-99%), NICM, frequent PVCs on amiodarone, history of DVT on Eliquis, LBBB, history of CVA, HLD, CKD stage III, hypothyroidism, and BPH who self catheterizes who presented to [facility] ED for evaluation of rapid heart rate.  Patient states he has been self catheterizing for the last 2 years.  He says about a week ago his primary doctor started him on Macrobid for UTI prophylaxis.  He says this morning he has some chills and some shaking in his right hand.  He checked his blood pressure several times this morning and noticed his heart rate was higher than usual, upper 90s to low 100s.  He then came to the ED for further evaluation as his heart rate is normally on the borderline bradycardic side.  He otherwise denies any subjective fevers, diaphoresis, nausea, vomiting, chest pain, dyspnea, abdominal pain, dysuria, diarrhea, body aches, or lower extremity swelling.  He has not noticed any skin infections or wounds.  He reports a chronic occasional cough which he attributes to postnasal drip/allergies.  He says he has not had any other recent medication changes other than starting Macrobid.  He denies any sick contacts.  In the ED, BP 123/91, pulse 113, RR 103, temp 102.8 F, SPO2 94% on room air. WBC 18.5, hemoglobin 15.7, platelets 196,000, sodium 133, potassium 4.5, bicarb 21, BUN 28, creatinine 1.55, serum glucose 222, high-sensitivity troponin I 29 > 48, lactic acid 2.5 > 2.3, magnesium 1.9, BNP 260.7. Urinalysis with negative nitrates, negative leukocytes.  Blood and urine cultures obtained and pending.  SARS-CoV-2 PCR negative.  Influenza A/B PCR negative. Portable chest x-ray without focal consolidation, edema, or effusion. Patient was given 1 g  Tylenol and IV cefepime.  The hospitalist service was consulted to admit for further evaluation management.   Assessment & Plan:   Principal Problem:   SIRS (systemic inflammatory response syndrome) (HCC) Active Problems:   Chronic combined systolic and diastolic CHF (congestive heart failure) (HCC)   Frequent PVCs   CKD (chronic kidney disease) stage 3, GFR 30-59 ml/min (HCC)   History of DVT (deep vein thrombosis)   Hypothyroidism  SIRS/febrile illness: Patient presented with leukocytosis, WBC 18.5, fever up to 102.8 F, tachycardia with pulse up to 113, tachypnea, and lactic acid 2.5.  No obvious infectious source although most likely risk for UTI/cystitis given self-catheterization.  Urinalysis today could be sterile from recent antibiotic use.  CXR without evidence of pneumonia.  Denies any other obvious infectious symptoms. --WBC 18.5>17.0 --Blood cultures x2: Pending --Urine culture: Pending --Continue ceftriaxone 1 g IV every 24 hours --CBC daily --Continue to monitor fever curve  Chronic combined systolic and diastolic CHF, compensated: Chronic stable.  Appears euvolemic.    Follows with cardiology outpatient, Dr. Haroldine Laws. --Losartan 12.5mg  p.o. daily --Spironolactone 12.5 mg p.o. daily --Torsemide 20 mg p.o. daily --Daily weights  History of frequent PVCs: --Continue amiodarone 100 mg p.o. daily.  History of DVT: --Continue Eliquis 2.5 mg p.o. twice daily.  CKD stage IIIb: Baseline creatinine 1.5-2.0, stable and at baseline --Avoid nephrotoxins, renally dose all medications  Hypothyroidism: TSH 1.126. --Continue levothyroxine 75 mcg p.o. daily  BPH: --Continue finasteride 5 mg p.o. daily   DVT prophylaxis: Eliquis   Code Status: Full Code Family Communication: Updated patient  spouse present at bedside  Disposition Plan:  Level of care: Telemetry Status is: Observation  The patient remains OBS appropriate and will d/c before 2  midnights.  Dispo: The patient is from: Home              Anticipated d/c is to: Home              Patient currently is not medically stable to d/c.   Difficult to place patient No    Consultants:   none  Procedures:   none  Antimicrobials:   Ceftriaxone 4/2>>  Cefepime 4/1 - 4/1    Subjective: Patient seen and examined at bedside, sitting at edge of bed.  No specific complaints this morning.  Spouse present.  No further fevers overnight.  Denies headache, no fever/chills/night sweats, no nausea/vomiting/diarrhea, no chest pain, no palpitations, no shortness of breath, no cough/congestion, no abdominal pain, no paresthesias.  No acute events overnight per nursing staff.  Objective: Vitals:   10/12/20 2112 10/13/20 0142 10/13/20 0500 10/13/20 0531  BP: 128/74 (!) 104/49  124/70  Pulse: 80 66    Resp: 19 (!) 21  (!) 22  Temp: 98.5 F (36.9 C) 99 F (37.2 C)  98.6 F (37 C)  TempSrc: Oral Oral  Oral  SpO2: 94% 94%  92%  Weight: (P) 91.7 kg  91.7 kg   Height: (P) 5\' 7"  (1.702 m)       Intake/Output Summary (Last 24 hours) at 10/13/2020 1004 Last data filed at 10/12/2020 1902 Gross per 24 hour  Intake 100.96 ml  Output --  Net 100.96 ml   Filed Weights   10/12/20 1557 10/12/20 2112 10/13/20 0500  Weight: 96.5 kg (P) 91.7 kg 91.7 kg    Examination:  General exam: Appears calm and comfortable, obese Respiratory system: Clear to auscultation. Respiratory effort normal.  On room air Cardiovascular system: S1 & S2 heard, RRR. No JVD, murmurs, rubs, gallops or clicks. No pedal edema. Gastrointestinal system: Abdomen is nondistended, soft and nontender. No organomegaly or masses felt. Normal bowel sounds heard. Central nervous system: Alert and oriented. No focal neurological deficits. Extremities: Symmetric 5 x 5 power. Skin: No rashes, lesions or ulcers, hyperkeratotic changes noted to toenails Psychiatry: Judgement and insight appear normal. Mood & affect  appropriate.     Data Reviewed: I have personally reviewed following labs and imaging studies  CBC: Recent Labs  Lab 10/12/20 1614 10/13/20 0002  WBC 18.5* 17.0*  NEUTROABS 16.0*  --   HGB 15.7 14.8  HCT 47.2 45.4  MCV 82.4 84.1  PLT 196 993   Basic Metabolic Panel: Recent Labs  Lab 10/12/20 1614 10/13/20 0002  NA 133* 136  K 4.5 4.1  CL 97* 100  CO2 21* 26  GLUCOSE 222* 172*  BUN 28* 28*  CREATININE 1.55* 1.80*  CALCIUM 9.2 9.0  MG 1.9  --    GFR: Estimated Creatinine Clearance: 33 mL/min (A) (by C-G formula based on SCr of 1.8 mg/dL (H)). Liver Function Tests: No results for input(s): AST, ALT, ALKPHOS, BILITOT, PROT, ALBUMIN in the last 168 hours. No results for input(s): LIPASE, AMYLASE in the last 168 hours. No results for input(s): AMMONIA in the last 168 hours. Coagulation Profile: Recent Labs  Lab 10/12/20 1614  INR 1.2   Cardiac Enzymes: No results for input(s): CKTOTAL, CKMB, CKMBINDEX, TROPONINI in the last 168 hours. BNP (last 3 results) No results for input(s): PROBNP in the last 8760 hours. HbA1C: Recent Labs  10/13/20 0002  HGBA1C 6.0*   CBG: No results for input(s): GLUCAP in the last 168 hours. Lipid Profile: No results for input(s): CHOL, HDL, LDLCALC, TRIG, CHOLHDL, LDLDIRECT in the last 72 hours. Thyroid Function Tests: Recent Labs    10/13/20 0002  TSH 1.126   Anemia Panel: No results for input(s): VITAMINB12, FOLATE, FERRITIN, TIBC, IRON, RETICCTPCT in the last 72 hours. Sepsis Labs: Recent Labs  Lab 10/12/20 1614 10/12/20 1801  LATICACIDVEN 2.5* 2.3*    Recent Results (from the past 240 hour(s))  Blood culture (routine x 2)     Status: None (Preliminary result)   Collection Time: 10/12/20  4:10 PM   Specimen: Left Antecubital; Blood  Result Value Ref Range Status   Specimen Description LEFT ANTECUBITAL BLOOD  Final   Special Requests   Final    BOTTLES DRAWN AEROBIC AND ANAEROBIC Blood Culture adequate  volume Performed at [facility], [place] [address] [place], [place] [address]    Culture PENDING  Incomplete   Report Status PENDING  Incomplete  Blood culture (routine x 2)     Status: None (Preliminary result)   Collection Time: 10/12/20  4:20 PM   Specimen: BLOOD RIGHT FOREARM  Result Value Ref Range Status   Specimen Description   Final    BLOOD RIGHT FOREARM Performed at [facility]    Special Requests   Final    BOTTLES DRAWN AEROBIC AND ANAEROBIC Blood Culture adequate volume Performed at [facility], [place] [address] [place], [place] [address]    Culture PENDING  Incomplete   Report Status PENDING  Incomplete  Resp Panel by RT-PCR (Flu A&B, Covid)     Status: None   Collection Time: 10/12/20  5:00 PM  Result Value Ref Range Status   SARS Coronavirus 2 by RT PCR NEGATIVE NEGATIVE Final    Comment: (NOTE) SARS-CoV-2 target nucleic acids are NOT DETECTED.  The SARS-CoV-2 RNA is generally detectable in upper respiratory specimens during the acute phase of infection. The lowest concentration of SARS-CoV-2 viral copies this assay can detect is 138 copies/mL. A negative result does not preclude SARS-Cov-2 infection and should not be used as the sole basis for treatment or other patient management decisions. A negative result may occur with  improper specimen collection/handling, submission of specimen other than nasopharyngeal swab, presence of viral mutation(s) within the areas targeted by this assay, and inadequate number of viral copies(<138 copies/mL). A negative result must be combined with clinical observations, patient history, and epidemiological information. The expected result is Negative.  Fact Sheet for Patients:  EntrepreneurPulse.com.au  Fact Sheet for Healthcare Providers:  IncredibleEmployment.be  This test is no t yet approved or cleared by the [place] FDA and  has been authorized for  detection and/or diagnosis of SARS-CoV-2 by FDA under an Emergency Use Authorization (EUA). This EUA will remain  in effect (meaning this test can be used) for the duration of the COVID-19 declaration under Section 564(b)(1) of the Act, 21 U.S.C.section 360bbb-3(b)(1), unless the authorization is terminated  or revoked sooner.       Influenza A by PCR NEGATIVE NEGATIVE Final   Influenza B by PCR NEGATIVE NEGATIVE Final    Comment: (NOTE) The Xpert Xpress SARS-CoV-2/FLU/RSV plus assay is intended as an aid in the diagnosis of influenza from Nasopharyngeal swab specimens and should not be used as a sole basis for treatment. Nasal washings and aspirates are unacceptable for Xpert Xpress SARS-CoV-2/FLU/RSV testing.  Fact  Sheet for Patients: EntrepreneurPulse.com.au  Fact Sheet for Healthcare Providers: IncredibleEmployment.be  This test is not yet approved or cleared by the [place] FDA and has been authorized for detection and/or diagnosis of SARS-CoV-2 by FDA under an Emergency Use Authorization (EUA). This EUA will remain in effect (meaning this test can be used) for the duration of the COVID-19 declaration under Section 564(b)(1) of the Act, 21 U.S.C. section 360bbb-3(b)(1), unless the authorization is terminated or revoked.  Performed at [facility]          Radiology Studies: DG Chest 1 View  Result Date: 10/12/2020 CLINICAL DATA:  Atrial fibrillation. EXAM: CHEST  1 VIEW COMPARISON:  08/09/2017 FINDINGS: Single view of the chest demonstrates clear lungs. Heart and mediastinum are within normal limits. Negative for pulmonary edema. Trachea is midline. Degenerative changes in both shoulders. IMPRESSION: No acute chest abnormality. Electronically Signed   By: Markus Daft M.D.   On: 10/12/2020 17:07        Scheduled Meds: . amiodarone  100 mg Oral Daily  . apixaban  2.5 mg Oral BID  . finasteride  5 mg Oral  Daily  . levothyroxine  75 mcg Oral Q0600   Continuous Infusions: . cefTRIAXone (ROCEPHIN)  IV 1 g (10/13/20 0540)     LOS: 0 days    Time spent: 38 minutes spent on chart review, discussion with nursing staff, consultants, updating family and interview/physical exam; more than 50% of that time was spent in counseling and/or coordination of care.    Evett Kassa J [place] DO [facility] Available via Epic secure chat 7am-7pm After these hours, please refer to coverage provider listed on [email or web address] 10/13/2020, 10:04 AM

## 2020-10-13 NOTE — Progress Notes (Signed)
PT Cancellation Note  Patient Details Name: Michael Randolph MRN: [id number] DOB: Apr 25, 1935   Cancelled Treatment:    Reason Eval/Treat Not Completed: Fatigue limiting ability to participate. Recently ambulated to [facility] with RN to self cath.Will check back tomorrow. Patient reports that he feels he is near his baseline, just tired.   Claretha Cooper 10/13/2020, 3:30 PM [place] Pager [phone] Office [phone]

## 2020-10-14 DIAGNOSIS — R6510 Systemic inflammatory response syndrome (SIRS) of non-infectious origin without acute organ dysfunction: Secondary | ICD-10-CM | POA: Diagnosis not present

## 2020-10-14 LAB — BASIC METABOLIC PANEL
Anion gap: 9 (ref 5–15)
BUN: 35 mg/dL — ABNORMAL HIGH (ref 8–23)
CO2: 26 mmol/L (ref 22–32)
Calcium: 8.8 mg/dL — ABNORMAL LOW (ref 8.9–10.3)
Chloride: 102 mmol/L (ref 98–111)
Creatinine, Ser: 1.83 mg/dL — ABNORMAL HIGH (ref 0.61–1.24)
GFR, Estimated: 36 mL/min — ABNORMAL LOW (ref >60)
Glucose, Bld: 123 mg/dL — ABNORMAL HIGH (ref 70–99)
Potassium: 3.8 mmol/L (ref 3.5–5.1)
Sodium: 137 mmol/L (ref 135–145)

## 2020-10-14 LAB — CBC
HCT: 39.8 % (ref 39.0–52.0)
Hemoglobin: 13.3 g/dL (ref 13.0–17.0)
MCH: 28.1 pg (ref 26.0–34.0)
MCHC: 33.4 g/dL (ref 30.0–36.0)
MCV: 84 fL (ref 80.0–100.0)
Platelets: 174 10*3/uL (ref 150–400)
RBC: 4.74 MIL/uL (ref 4.22–5.81)
RDW: 13.4 % (ref 11.5–15.5)
WBC: 9.4 10*3/uL (ref 4.0–10.5)
nRBC: 0 % (ref 0.0–0.2)

## 2020-10-14 LAB — URINE CULTURE: Culture: NO GROWTH

## 2020-10-14 MED ORDER — CEFDINIR 300 MG PO CAPS: 300.0000 mg | ORAL_CAPSULE | Freq: Two times a day (BID) | ORAL | 0 refills | Status: AC

## 2020-10-14 NOTE — Progress Notes (Signed)
AVS given to patient and explained at the bedside. Medications and follow up appointments have been explained with pt verbalizing understanding.  

## 2020-10-14 NOTE — Discharge Summary (Signed)
Physician Discharge Summary  IKE MARAGH KYH:[id number] DOB: December 24, 1934 DOA: 10/12/2020  PCP: Shon Baton, MD  Admit date: 10/12/2020 Discharge date: 10/14/2020  Admitted From: Home Disposition: Home  Recommendations for Outpatient Follow-up:  1. Follow up with PCP in 1-2 weeks 2. Continue cefdinir 300 mg p.o. twice daily to complete 10-day antibiotic course for suspected UTI 3. 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  Patient states he has been self catheterizing for the last 2 years. He says about a week ago his primary doctor started him on Macrobid for UTI prophylaxis. He says this morning he has some chills and some shaking in his right hand. He checked his blood pressure several times this morning and noticed his heart rate was higher than usual, upper 90s to low 100s. He then came to the ED for further evaluation as his heart rate is normally on the borderline bradycardic side.  He otherwise denies any subjective fevers, diaphoresis, nausea, vomiting, chest pain, dyspnea, abdominal pain, dysuria, diarrhea, body aches, or lower extremity swelling. He has not noticed any skin infections or wounds. He reports a chronic occasional cough which he attributes to postnasal drip/allergies. He says he has not had any other recent medication changes other than starting Macrobid. He denies any sick contacts.  In the ED, BP 123/91, pulse 113, RR 103, temp 102.8 F, SPO2 94% on room  air. WBC 18.5, hemoglobin 15.7, platelets 196,000, sodium 133, potassium 4.5, bicarb 21, BUN 28, creatinine 1.55, serum glucose 222, high-sensitivity troponin I 29 > 48, lactic acid 2.5 > 2.3, magnesium 1.9, BNP 260.7. Urinalysis with negative nitrates, negative leukocytes. Blood and urine cultures obtained and pending. SARS-CoV-2 PCR negative. Influenza A/B PCR negative. Portable chest x-ray without focal consolidation, edema, or effusion. Patient was given 1 g Tylenol and IV cefepime. The hospitalist service was consulted to admit for further evaluation management.  Hospital course:  SIRS/febrile illness: Patient presented with leukocytosis, WBC 18.5, fever up to 102.8 F, tachycardia with pulse up to 113, tachypnea, and lactic acid 2.5.No obvious infectious source although most likely risk for UTI/cystitis given self-catheterization. Urinalysis today could be sterile from recent antibiotic use. CXR without evidence of pneumonia. Denies any other obvious infectious symptoms.  Patient was started on IV ceftriaxone with improvement of her white blood cell count from 18.5 to 9.4 at time of discharge, and remains fever free since admission.  Urine culture with no growth, although on Macrobid outpatient.  We will continue antibiotics following discharge with cefdinir 300 mg p.o. twice daily to complete 10-day course.  Outpatient follow-up with PCP.  Recommend CBC at next visit.  Chronic combined systolic and diastolic CHF, compensated: Chronic stable. Appears euvolemic.   Follows with cardiology outpatient, Dr. Haroldine Laws.  Continue losartan 12.5mg  p.o. daily, Spironolactone 12.5 mg p.o. daily, Torsemide 20 mg p.o. daily.   History of frequent PVCs: Continue amiodarone 100 mg p.o. daily.  History of DVT: Continue Eliquis 2.5 mg p.o. twice daily.  CKD stage IIIb: Baseline creatinine 1.5-2.0, stable and at baseline.  Repeat BMP in next PCP visit.  Hypothyroidism: TSH 1.126. Continue  levothyroxine 75 mcg p.o. daily  BPH: Continue finasteride 5 mg p.o. daily  Discharge Diagnoses:  Active Problems:   Chronic combined systolic and diastolic CHF (congestive heart failure) (HCC)   Frequent PVCs   CKD (chronic kidney disease) stage 3, GFR 30-59 ml/min (HCC)   History of DVT (deep vein thrombosis)   Hypothyroidism    Discharge Instructions  Discharge Instructions    Call MD for:  difficulty breathing, headache or visual disturbances   Complete by: As directed    Call MD for:  extreme fatigue   Complete by: As directed    Call MD for:  persistant dizziness or light-headedness   Complete by: As directed    Call MD for:  persistant nausea and vomiting   Complete by: As directed    Call MD for:  severe uncontrolled pain   Complete by: As directed    Call MD for:  temperature >100.4   Complete by: As directed    Diet - low sodium heart healthy   Complete by: As directed    Increase activity slowly   Complete by: As directed      Allergies as of 10/14/2020      Reactions   Actos [pioglitazone] Other (See Comments)   Slow HR and pain in face    Anesthetics, Halogenated Other (See Comments)   Slept too long   Crestor [rosuvastatin Calcium]    Pain    Sulfa Antibiotics Other (See Comments)   Hallucinations   Dye Fdc Red [red Dye] Rash   # 40      Medication List    STOP taking these medications   nitrofurantoin 100 MG capsule Commonly known as: MACRODANTIN     TAKE these medications   amiodarone 200 MG tablet Commonly known as: PACERONE Take 1/2 (one-half) tablet by mouth once daily What changed: See the new instructions.   apixaban 2.5 MG Tabs tablet Commonly known as: ELIQUIS Take 2.5 mg by mouth 2 (two) times daily.   cefdinir 300 MG capsule Commonly known as: OMNICEF Take 1 capsule (300 mg total) by mouth 2 (two) times daily for 7 days. Start taking on: October 15, 2020   Cholecalciferol 50 MCG (2000 UT) Caps Take 2,000 Units by mouth 2  (two) times daily.   finasteride 5 MG tablet Commonly known as: PROSCAR Take 5 mg by mouth daily.   GARLIC PO Take 1 tablet by mouth daily.   levothyroxine 75 MCG tablet Commonly known as: SYNTHROID Take 75 mcg by mouth daily.   losartan 25 MG tablet Commonly known as: COZAAR Take 1/2 (one-half) tablet by mouth once daily   MAGNESIUM PO Take 1 tablet by mouth 2 (two) times daily.   MSM 1000 MG Caps Take 1,000 mg by mouth daily.   PROBIOTIC-10 PO Take 1 tablet by mouth daily.   sodium chloride 0.65 % Soln nasal spray Commonly known as: OCEAN Place 1 spray into both nostrils as needed for congestion.   spironolactone 25 MG tablet Commonly known as: ALDACTONE Take 0.5 tablets (12.5 mg total) by mouth daily. NEEDS APPOINTMENT FOR FUTURE REFILLS   torsemide 20 MG tablet Commonly known as: DEMADEX Take 1 tablet (20 mg total) by mouth daily. NEEDS APPOINTMENT FOR FUTURE REFILS       Follow-up Information    Shon Baton, MD. Schedule an appointment as soon as possible for a visit in 1 week(s).   Specialty: Internal Medicine Contact information: [place]  [place] [address] [phone]              Allergies  Allergen Reactions  . Actos [Pioglitazone] Other (See Comments)    Slow HR and pain in face   . Anesthetics, Halogenated Other (See Comments)    Slept too long  . Crestor [Rosuvastatin Calcium]     Pain   . Sulfa Antibiotics Other (See Comments)    Hallucinations   . Dye Fdc Red [Red Dye] Rash    # 40    Consultations:  None   Procedures/Studies: DG Chest 1 View  Result Date: 10/12/2020 CLINICAL DATA:  Atrial fibrillation. EXAM: CHEST  1 VIEW COMPARISON:  08/09/2017 FINDINGS: Single view of the chest demonstrates clear lungs. Heart and mediastinum are within normal limits. Negative for pulmonary edema. Trachea is midline. Degenerative changes in both shoulders. IMPRESSION: No acute chest abnormality. Electronically Signed   By: Markus Daft M.D.   On: 10/12/2020 17:07      Subjective: Patient seen and examined at bedside, resting comfortably.  Eating breakfast at edge of bed.  No complaints this morning.  Remains fever free.  No family present at bedside.  Ready for discharge home.  Denies headache, no fever/chills/night sweats, no nausea/vomiting/diarrhea, no chest pain, no palpitations, no shortness of breath, no abdominal pain, no weakness, no fatigue, no paresthesias.  No acute events overnight per nursing staff.  Discharge Exam: Vitals:   10/13/20 2042 10/14/20 0557  BP: (!) 108/57 115/69  Pulse: (!) 57 (!) 55  Resp: 18 18  Temp: 98.1 F (36.7 C) 98 F (36.7 C)  SpO2: 96% 97%   Vitals:   10/13/20 1413 10/13/20 2042 10/14/20 0500 10/14/20 0557  BP: 105/62 (!) 108/57  115/69  Pulse: (!) 58 (!) 57  (!) 55  Resp: 18 18  18   Temp: 98.5 F (36.9 C) 98.1 F (36.7 C)  98 F (36.7 C)  TempSrc: Oral Oral  Oral  SpO2: 93% 96%  97%  Weight:   91.9 kg   Height:        General: Pt is alert, awake, not in acute distress Cardiovascular: RRR, S1/S2 +, no rubs, no gallops Respiratory: CTA bilaterally, no wheezing, no rhonchi Abdominal: Soft, NT, ND, bowel sounds + Extremities: no edema, no cyanosis    The results of significant diagnostics from this hospitalization (including imaging, microbiology, ancillary and laboratory) are listed below for reference.     Microbiology: Recent Results (from the past 240 hour(s))  Blood culture (routine x 2)     Status: None (Preliminary result)   Collection Time: 10/12/20  4:10 PM   Specimen: Left Antecubital; Blood  Result Value Ref Range Status   Specimen Description LEFT ANTECUBITAL BLOOD  Final   Special Requests   Final    BOTTLES DRAWN AEROBIC AND ANAEROBIC Blood Culture adequate volume Performed at [facility], [address], [place], [place] [address]    Culture PENDING  Incomplete   Report Status PENDING  Incomplete  Blood culture (routine x 2)      Status: None (Preliminary result)   Collection Time: 10/12/20  4:20 PM   Specimen: BLOOD RIGHT FOREARM  Result Value Ref Range Status   Specimen Description   Final    BLOOD RIGHT FOREARM Performed at [facility]    Special Requests   Final    BOTTLES DRAWN AEROBIC AND ANAEROBIC Blood Culture adequate volume Performed at [facility], [place],  [place] [address]    Culture PENDING  Incomplete   Report Status PENDING  Incomplete  Resp Panel by RT-PCR (Flu A&B, Covid)     Status: None   Collection Time: 10/12/20  5:00 PM  Result Value Ref Range Status   SARS Coronavirus 2 by RT PCR NEGATIVE NEGATIVE Final    Comment: (NOTE) SARS-CoV-2 target nucleic acids are NOT DETECTED.  The SARS-CoV-2 RNA is generally detectable in upper respiratory specimens during the acute phase of infection. The lowest concentration of SARS-CoV-2 viral copies this assay can detect is 138 copies/mL. A negative result does not preclude SARS-Cov-2 infection and should not be used as the sole basis for treatment or other patient management decisions. A negative result may occur with  improper specimen collection/handling, submission of specimen other than nasopharyngeal swab, presence of viral mutation(s) within the areas targeted by this assay, and inadequate number of viral copies(<138 copies/mL). A negative result must be combined with clinical observations, patient history, and epidemiological information. The expected result is Negative.  Fact Sheet for Patients:  EntrepreneurPulse.com.au  Fact Sheet for Healthcare Providers:  IncredibleEmployment.be  This test is no t yet approved or cleared by the [place] FDA and  has been authorized for detection and/or diagnosis of SARS-CoV-2 by FDA under an Emergency Use Authorization (EUA). This EUA will remain  in effect (meaning this test can be used) for the duration of  the COVID-19 declaration under Section 564(b)(1) of the Act, 21 U.S.C.section 360bbb-3(b)(1), unless the authorization is terminated  or revoked sooner.       Influenza A by PCR NEGATIVE NEGATIVE Final   Influenza B by PCR NEGATIVE NEGATIVE Final    Comment: (NOTE) The Xpert Xpress SARS-CoV-2/FLU/RSV plus assay is intended as an aid in the diagnosis of influenza from Nasopharyngeal swab specimens and should not be used as a sole basis for treatment. Nasal washings and aspirates are unacceptable for Xpert Xpress SARS-CoV-2/FLU/RSV testing.  Fact Sheet for Patients: EntrepreneurPulse.com.au  Fact Sheet for Healthcare Providers: IncredibleEmployment.be  This test is not yet approved or cleared by the [place] FDA and has been authorized for detection and/or diagnosis of SARS-CoV-2 by FDA under an Emergency Use Authorization (EUA). This EUA will remain in effect (meaning this test can be used) for the duration of the COVID-19 declaration under Section 564(b)(1) of the Act, 21 U.S.C. section 360bbb-3(b)(1), unless the authorization is terminated or revoked.  Performed at [facility]   Urine culture     Status: None   Collection Time: 10/12/20  6:23 PM   Specimen: In/Out Cath Urine  Result Value Ref Range Status   Specimen Description   Final    IN/OUT CATH URINE Performed at [facility]    Special Requests NONE Performed at [facility]   Final   Culture   Final    NO GROWTH Performed at [facility], [address] [place], [place] [address]    Report Status 10/14/2020 FINAL  Final     Labs: BNP (last 3 results) Recent Labs    10/12/20 1614  BNP 659.9*   Basic Metabolic Panel: Recent Labs  Lab 10/12/20 1614 10/13/20 0002 10/14/20 0332  NA 133* 136 137  K 4.5 4.1 3.8  CL 97* 100 102  CO2 21* 26 26  GLUCOSE 222* 172* 123*  BUN 28* 28* 35*  CREATININE 1.55*  1.80* 1.83*  CALCIUM 9.2 9.0 8.8*  MG 1.9  --   --  Liver Function Tests: No results for input(s): AST, ALT, ALKPHOS, BILITOT, PROT, ALBUMIN in the last 168 hours. No results for input(s): LIPASE, AMYLASE in the last 168 hours. No results for input(s): AMMONIA in the last 168 hours. CBC: Recent Labs  Lab 10/12/20 1614 10/13/20 0002 10/14/20 0332  WBC 18.5* 17.0* 9.4  NEUTROABS 16.0*  --   --   HGB 15.7 14.8 13.3  HCT 47.2 45.4 39.8  MCV 82.4 84.1 84.0  PLT 196 185 174   Cardiac Enzymes: No results for input(s): CKTOTAL, CKMB, CKMBINDEX, TROPONINI in the last 168 hours. BNP: Invalid input(s): POCBNP CBG: No results for input(s): GLUCAP in the last 168 hours. D-Dimer No results for input(s): DDIMER in the last 72 hours. Hgb A1c Recent Labs    10/13/20 0002  HGBA1C 6.0*   Lipid Profile No results for input(s): CHOL, HDL, LDLCALC, TRIG, CHOLHDL, LDLDIRECT in the last 72 hours. Thyroid function studies Recent Labs    10/13/20 0002  TSH 1.126   Anemia work up No results for input(s): VITAMINB12, FOLATE, FERRITIN, TIBC, IRON, RETICCTPCT in the last 72 hours. Urinalysis    Component Value Date/Time   COLORURINE YELLOW 10/12/2020 1732   APPEARANCEUR CLEAR 10/12/2020 1732   LABSPEC 1.017 10/12/2020 1732   PHURINE 5.0 10/12/2020 1732   GLUCOSEU NEGATIVE 10/12/2020 1732   GLUCOSEU NEGATIVE 01/01/2015 1130   HGBUR NEGATIVE 10/12/2020 1732   BILIRUBINUR NEGATIVE 10/12/2020 1732   KETONESUR NEGATIVE 10/12/2020 1732   PROTEINUR TRACE (A) 10/12/2020 1732   UROBILINOGEN 0.2 01/01/2015 1130   NITRITE NEGATIVE 10/12/2020 1732   LEUKOCYTESUR NEGATIVE 10/12/2020 1732   Sepsis Labs Invalid input(s): PROCALCITONIN,  WBC,  LACTICIDVEN Microbiology Recent Results (from the past 240 hour(s))  Blood culture (routine x 2)     Status: None (Preliminary result)   Collection Time: 10/12/20  4:10 PM   Specimen: Left Antecubital; Blood  Result Value Ref Range Status   Specimen  Description LEFT ANTECUBITAL BLOOD  Final   Special Requests   Final    BOTTLES DRAWN AEROBIC AND ANAEROBIC Blood Culture adequate volume Performed at [facility], [place] [place] [address], [place], [place] [address]    Culture PENDING  Incomplete   Report Status PENDING  Incomplete  Blood culture (routine x 2)     Status: None (Preliminary result)   Collection Time: 10/12/20  4:20 PM   Specimen: BLOOD RIGHT FOREARM  Result Value Ref Range Status   Specimen Description   Final    BLOOD RIGHT FOREARM Performed at [facility]    Special Requests   Final    BOTTLES DRAWN AEROBIC AND ANAEROBIC Blood Culture adequate volume Performed at [facility], [place] [address], [place], [place] [address]    Culture PENDING  Incomplete   Report Status PENDING  Incomplete  Resp Panel by RT-PCR (Flu A&B, Covid)     Status: None   Collection Time: 10/12/20  5:00 PM  Result Value Ref Range Status   SARS Coronavirus 2 by RT PCR NEGATIVE NEGATIVE Final    Comment: (NOTE) SARS-CoV-2 target nucleic acids are NOT DETECTED.  The SARS-CoV-2 RNA is generally detectable in upper respiratory specimens during the acute phase of infection. The lowest concentration of SARS-CoV-2 viral copies this assay can detect is 138 copies/mL. A negative result does not preclude SARS-Cov-2 infection and should not be used as the sole basis for treatment or other patient management decisions. A negative result may occur with  improper  specimen collection/handling, submission of specimen other than nasopharyngeal swab, presence of viral mutation(s) within the areas targeted by this assay, and inadequate number of viral copies(<138 copies/mL). A negative result must be combined with clinical observations, patient history, and epidemiological information. The expected result is Negative.  Fact Sheet for Patients:  EntrepreneurPulse.com.au  Fact Sheet for Healthcare Providers:   IncredibleEmployment.be  This test is no t yet approved or cleared by the [place] FDA and  has been authorized for detection and/or diagnosis of SARS-CoV-2 by FDA under an Emergency Use Authorization (EUA). This EUA will remain  in effect (meaning this test can be used) for the duration of the COVID-19 declaration under Section 564(b)(1) of the Act, 21 U.S.C.section 360bbb-3(b)(1), unless the authorization is terminated  or revoked sooner.       Influenza A by PCR NEGATIVE NEGATIVE Final   Influenza B by PCR NEGATIVE NEGATIVE Final    Comment: (NOTE) The Xpert Xpress SARS-CoV-2/FLU/RSV plus assay is intended as an aid in the diagnosis of influenza from Nasopharyngeal swab specimens and should not be used as a sole basis for treatment. Nasal washings and aspirates are unacceptable for Xpert Xpress SARS-CoV-2/FLU/RSV testing.  Fact Sheet for Patients: EntrepreneurPulse.com.au  Fact Sheet for Healthcare Providers: IncredibleEmployment.be  This test is not yet approved or cleared by the [place] FDA and has been authorized for detection and/or diagnosis of SARS-CoV-2 by FDA under an Emergency Use Authorization (EUA). This EUA will remain in effect (meaning this test can be used) for the duration of the COVID-19 declaration under Section 564(b)(1) of the Act, 21 U.S.C. section 360bbb-3(b)(1), unless the authorization is terminated or revoked.  Performed at [facility]   Urine culture     Status: None   Collection Time: 10/12/20  6:23 PM   Specimen: In/Out Cath Urine  Result Value Ref Range Status   Specimen Description   Final    IN/OUT CATH URINE Performed at [facility]    Special Requests NONE Performed at [facility]   Final   Culture   Final    NO GROWTH Performed at [facility], [address], [place], [place] [address]    Report Status  10/14/2020 FINAL  Final     Time coordinating discharge: Over 30 minutes  SIGNED:   Donnamarie Poag [place] DO  [facility] 10/14/2020, 9:05 AM

## 2020-10-14 NOTE — Progress Notes (Signed)
PT Cancellation Note  Patient Details Name: Michael Randolph MRN: [id number] DOB: April 09, 1935   Cancelled Treatment:      PT order received but eval deferred at pt request.  Pt states he feels he is at baseline and with not current PT needs.  Pt states physician is discharging him home this am.  RN in attendance and verifies pt statements.   [name] 10/14/2020, 8:09 AM

## 2020-10-18 LAB — CULTURE, BLOOD (ROUTINE X 2)
Culture: NO GROWTH
Culture: NO GROWTH
Special Requests: ADEQUATE
Special Requests: ADEQUATE

## 2020-10-22 DIAGNOSIS — A419 Sepsis, unspecified organism: Secondary | ICD-10-CM | POA: Diagnosis not present

## 2020-10-22 DIAGNOSIS — R6520 Severe sepsis without septic shock: Secondary | ICD-10-CM | POA: Diagnosis not present

## 2020-10-22 DIAGNOSIS — E1122 Type 2 diabetes mellitus with diabetic chronic kidney disease: Secondary | ICD-10-CM | POA: Diagnosis not present

## 2020-10-22 DIAGNOSIS — N1831 Chronic kidney disease, stage 3a: Secondary | ICD-10-CM | POA: Diagnosis not present

## 2020-10-22 DIAGNOSIS — I5042 Chronic combined systolic (congestive) and diastolic (congestive) heart failure: Secondary | ICD-10-CM | POA: Diagnosis not present

## 2020-10-22 DIAGNOSIS — N179 Acute kidney failure, unspecified: Secondary | ICD-10-CM | POA: Diagnosis not present

## 2020-10-22 DIAGNOSIS — N390 Urinary tract infection, site not specified: Secondary | ICD-10-CM | POA: Diagnosis not present

## 2020-10-23 ENCOUNTER — Other Ambulatory Visit (HOSPITAL_COMMUNITY): Payer: Self-pay | Admitting: *Deleted

## 2020-10-23 DIAGNOSIS — I5022 Chronic systolic (congestive) heart failure: Secondary | ICD-10-CM

## 2020-10-24 ENCOUNTER — Ambulatory Visit (HOSPITAL_BASED_OUTPATIENT_CLINIC_OR_DEPARTMENT_OTHER)
Admission: RE | Admit: 2020-10-24 | Discharge: 2020-10-24 | Disposition: A | Discharge status: Home / Self Care | Payer: Medicare Other | Source: Ambulatory Visit

## 2020-10-24 ENCOUNTER — Other Ambulatory Visit: Payer: Self-pay

## 2020-10-24 ENCOUNTER — Encounter (HOSPITAL_COMMUNITY): Payer: Self-pay | Admitting: Internal Medicine

## 2020-10-24 ENCOUNTER — Ambulatory Visit (HOSPITAL_COMMUNITY)
Admission: RE | Admit: 2020-10-24 | Discharge: 2020-10-24 | Disposition: A | Discharge status: Home / Self Care | Payer: Medicare Other | Source: Ambulatory Visit | Attending: Internal Medicine | Admitting: Internal Medicine

## 2020-10-24 VITALS — BP 112/68 | HR 61 | Wt 205.2 lb

## 2020-10-24 DIAGNOSIS — R7989 Other specified abnormal findings of blood chemistry: Secondary | ICD-10-CM | POA: Insufficient documentation

## 2020-10-24 DIAGNOSIS — I428 Other cardiomyopathies: Secondary | ICD-10-CM | POA: Insufficient documentation

## 2020-10-24 DIAGNOSIS — I5022 Chronic systolic (congestive) heart failure: Secondary | ICD-10-CM | POA: Diagnosis not present

## 2020-10-24 DIAGNOSIS — E039 Hypothyroidism, unspecified: Secondary | ICD-10-CM | POA: Insufficient documentation

## 2020-10-24 DIAGNOSIS — I447 Left bundle-branch block, unspecified: Secondary | ICD-10-CM | POA: Insufficient documentation

## 2020-10-24 DIAGNOSIS — Z79899 Other long term (current) drug therapy: Secondary | ICD-10-CM | POA: Insufficient documentation

## 2020-10-24 DIAGNOSIS — Z8249 Family history of ischemic heart disease and other diseases of the circulatory system: Secondary | ICD-10-CM | POA: Insufficient documentation

## 2020-10-24 DIAGNOSIS — I493 Ventricular premature depolarization: Secondary | ICD-10-CM | POA: Diagnosis not present

## 2020-10-24 DIAGNOSIS — Z7901 Long term (current) use of anticoagulants: Secondary | ICD-10-CM | POA: Insufficient documentation

## 2020-10-24 DIAGNOSIS — Z86718 Personal history of other venous thrombosis and embolism: Secondary | ICD-10-CM | POA: Diagnosis not present

## 2020-10-24 DIAGNOSIS — N62 Hypertrophy of breast: Secondary | ICD-10-CM | POA: Diagnosis not present

## 2020-10-24 DIAGNOSIS — I5042 Chronic combined systolic (congestive) and diastolic (congestive) heart failure: Secondary | ICD-10-CM | POA: Diagnosis not present

## 2020-10-24 MED ORDER — EPLERENONE 25 MG PO TABS: 12.5000 mg | ORAL_TABLET | Freq: Every day | ORAL | 3 refills | Status: DC

## 2020-10-24 MED ORDER — LOSARTAN POTASSIUM 25 MG PO TABS: ORAL_TABLET | ORAL | 0 refills | Status: AC

## 2020-10-24 MED ORDER — EPLERENONE 25 MG PO TABS: 25.0000 mg | ORAL_TABLET | Freq: Every day | ORAL | 3 refills | Status: DC

## 2020-10-24 NOTE — Patient Instructions (Addendum)
Stop Spironalactone  Stop Amiodarone  Start Eplerenone  12.5 mg  ( 1/2 tablet) daily  Increase Losartan to 25 mg (1 tablet) daily  Your physician recommends that you schedule a follow-up appointment in: 3 months  If you have any questions or concerns before your next appointment please send [place] a message through [place] or call our office at [phone].    TO LEAVE A MESSAGE FOR THE NURSE SELECT OPTION 2, PLEASE LEAVE A MESSAGE INCLUDING: . YOUR NAME . DATE OF BIRTH . CALL BACK NUMBER . REASON FOR CALL**this is important as we prioritize the call backs  [place] AS LONG AS YOU CALL BEFORE 4:00 PM  At the [facility], you and your health needs are our priority. As part of our continuing mission to provide you with exceptional heart care, we have created designated Provider Care Teams. These Care Teams include your primary Cardiologist (physician) and Advanced Practice Providers (APPs- Physician Assistants and Nurse Practitioners) who all work together to provide you with the care you need, when you need it.   You may see any of the following providers on your designated Care Team at your next follow up: [place] Kitchen Dr Glori Bickers . Dr Loralie Champagne . Dr Vickki Muff . Darrick Grinder, NP . Lyda Jester, [place] . Audry Riles, PharmD   Please be sure to bring in all your medications bottles to every appointment.

## 2020-10-24 NOTE — Progress Notes (Signed)
  Echocardiogram 2D Echocardiogram has been performed.  Merrie Roof F 10/24/2020, 3:11 PM

## 2020-10-24 NOTE — Progress Notes (Signed)
Advanced Heart Failure Clinic Note  Primary HF Cardiologist: Dr Haroldine Laws   HPI: Michael Randolph is a 86 y.o. malewith past medical history of nonischemic cardiomyopathy, PVCs, DVT, MR, DMII, CVA,and left carotid bruit.   He had stopped all HF medications September 2018 and started feeling poorly in December 2018. Admitted 07/25/17 with marked volume overload and was diuresed IV lasix but had poor response. ECHO completed and showed EF 25% , RV mildly dilated, and grade II DD. Started on milrinone to support RV and diurese. Diuresed with IV lasix + metolazone and later transitioned to torsemide 40 mg daily. Renal function was followed closely with diuretics adjusted. He was not placed on ARB due to elevated creatinine. He was not placed on bb due to low output. He had PVCs prior to admit but with increasing frequency after starting milrinone so IV amio was started. Once milrinone was stopped he transitioned to amiodarone 200 mg twice daily. PVCs reduced after milrinone was stopped. He was on coumadin prior to admit for history of DVT. INR will continue to be managed in the community by Dr Virgina Jock. Discharged 08/05/2017-->207 pounds.   He presents today for regular follow up. We have not seen him since 11/20. He is here with his wife.   He went to [facility] ER on 4/1 because HR went from 40-50s -> 100. Found to have a UTI (he self caths himself 3x/daily). Admitted and treated with abx   Feels better now. Still hunting and remains active. Denies SOB, orthopnea or PND. Checks BP and HR everyday. HR 40-50s. SBP low 100s  Echo today 10/24/20 EF 30-35% Personally reviewed  Echo 06/08/19 EF 40-45%   Echo 6/19 - EF 35-40% mild AS   Echo 1/19  - EF 25%   Review of systems complete and found to be negative unless listed in HPI.    SH:  Social History   Socioeconomic History  . Marital status: Married    Spouse name: Not on file  . Number of children: 3  . Years of education: Associates  .  Highest education level: Not on file  Occupational History  . Occupation: Retired   Tobacco Use  . Smoking status: Never Smoker  . Smokeless tobacco: Never Used  Vaping Use  . Vaping Use: Never used  Substance and Sexual Activity  . Alcohol use: No    Alcohol/week: 0.0 standard drinks  . Drug use: No  . Sexual activity: Yes  Other Topics Concern  . Not on file  Social History Narrative   Drinks 1-2 cups of coffee a day    Social Determinants of Health   Financial Resource Strain: Not on file  Food Insecurity: Not on file  Transportation Needs: Not on file  Physical Activity: Not on file  Stress: Not on file  Social Connections: Not on file  Intimate Partner Violence: Not on file    FH:  Family History  Problem Relation Age of Onset  . Stroke Mother   . Hypertension Mother   . Heart attack Father   . Deep vein thrombosis Father   . Crohn's disease Brother   . Schizophrenia Sister     Past Medical History:  Diagnosis Date  . Congestive heart failure (CHF) ([place])   . Diabetes mellitus without complication (HCC)    no medications  . Diverticulosis   . DVT (deep venous thrombosis) ([place])   . Heart disease   . Hyperlipemia   . Kidney stone   . NICM (nonischemic  cardiomyopathy) ([place])   . OSA (obstructive sleep apnea)   . Stroke ([place])   . TIA (transient ischemic attack) 2008   was seen on CT   . Toe infection   . UTI (urinary tract infection) 10/12/2020    Current Outpatient Medications  Medication Sig Dispense Refill  . amiodarone (PACERONE) 200 MG tablet Take 1/2 (one-half) tablet by mouth once daily 45 tablet 3  . apixaban (ELIQUIS) 2.5 MG TABS tablet Take 2.5 mg by mouth 2 (two) times daily.    . Cholecalciferol 2000 UNITS CAPS Take 2,000 Units by mouth 2 (two) times daily.     . finasteride (PROSCAR) 5 MG tablet Take 5 mg by mouth daily.    Marland Kitchen GARLIC PO Take 1 tablet by mouth daily.    Marland Kitchen levothyroxine (SYNTHROID) 75 MCG tablet Take 75 mcg by mouth daily.     Marland Kitchen losartan (COZAAR) 25 MG tablet Take 1/2 (one-half) tablet by mouth once daily 45 tablet 0  . MAGNESIUM PO Take 1 tablet by mouth 2 (two) times daily.     . Methylsulfonylmethane (MSM) 1000 MG CAPS Take 1,000 mg by mouth daily.     . Probiotic Product (PROBIOTIC-10 PO) Take 1 tablet by mouth daily.    . sodium chloride (OCEAN) 0.65 % SOLN nasal spray Place 1 spray into both nostrils as needed for congestion.    Marland Kitchen spironolactone (ALDACTONE) 25 MG tablet Take 0.5 tablets (12.5 mg total) by mouth daily. NEEDS APPOINTMENT FOR FUTURE REFILLS 45 tablet 0  . torsemide (DEMADEX) 20 MG tablet Take 1 tablet (20 mg total) by mouth daily. NEEDS APPOINTMENT FOR FUTURE REFILS 90 tablet 0   No current facility-administered medications for this encounter.    Vitals:   10/24/20 1519  BP: 112/68  Pulse: 61  SpO2: 97%  Weight: 93.1 kg (205 lb 3.2 oz)   Wt Readings from Last 3 Encounters:  10/24/20 93.1 kg (205 lb 3.2 oz)  10/14/20 91.9 kg (202 lb 9.6 oz)  06/08/19 96.4 kg (212 lb 8 oz)    PHYSICAL EXAM: General:  Elderly Well appearing. No resp difficulty HEENT: normal Neck: supple. no JVD. Carotids 2+ bilat; no bruits. No lymphadenopathy or thryomegaly appreciated. Cor: PMI nondisplaced. Brady regular rate & rhythm. No rubs, gallops or murmurs. Lungs: clear Abdomen: obesesoft, nontender, nondistended. No hepatosplenomegaly. No bruits or masses. Good bowel sounds. Extremities: no cyanosis, clubbing, rash, edema Neuro: alert & orientedx3, cranial nerves grossly intact. moves all 4 extremities w/o difficulty. Affect pleasan   EKG:  Sinus brady 51 1st degree heart block (230ms) LBBB (was iLBBB previously) No PVCs Personally reviewed   ASSESSMENT & PLAN:  1. Chronic Combined Systolic Heart Failure due to NICM. Suspect PVC cardiomyoapthy - Had [place] 2016 with nonobstructive CAD. No recent cath due to CKD  - ECHO 07/2017 EF 25% Grade II DD, Mod MR, Severe LAE.  - Hospitalized 1/19 with cardiogenic  shock requiring milrinone - Echo 5/19 EF 35-40% -> EF improved with PVC suppression - Echo 06/08/19 EF 40-45% - Echo today 10/24/20 EF 30-35% Personally reviewed - Stable NYHA II symptoms - Volume status stable on exam.  - Continue torsemide 20 mg daily.  - Increase losartan to 25 mg daily.  - Switch spiro to eplerenone 12.5 mg daily due to gynecomastia - HR too low for b-blocker - Cannot use SGLT2i due to high risk for UTI with self cath  - Would not recommend ICD with advanced age and high-risk of infection with need for  self caths - Given lack of further improvement of EF with PVCs suppression, I am not sure this is PVC CM. ? LBBB.   2. Frequent PVCs  - Suppressed with amio.  - I am not convinced PVCs causing CM. With severe bradycardia will stop amio.   3. Bradycardia with LBBB - has significant conduction system disease but does not meet criteria for pacing and would like to avoid device implantation given high risk of infection as above -  Can consider PYP study at some point to exclude amyloid but no other symptos to suggest  4. L carotid bruit - Carotid US 03/2017 1-39% . Repeat prn   5. H/O DVT - On apixaban 2.5 bid per Amplify EXT trial. No bleeding  6. Hypothyroidism - likely related to amiodarone (beiung stopped) - managed by Dr. Virgina Jock.  - continue synthroid   Glori Bickers, MD  3:34 PM

## 2020-10-25 ENCOUNTER — Telehealth (HOSPITAL_COMMUNITY): Payer: Self-pay | Admitting: Pharmacy Technician

## 2020-10-25 ENCOUNTER — Other Ambulatory Visit (HOSPITAL_COMMUNITY): Payer: Self-pay

## 2020-10-25 NOTE — Telephone Encounter (Signed)
Patient Advocate Encounter   Received notification from [facility] that prior authorization for Eplerenone is required.   PA submitted on CoverMyMeds Key [id number] Status is pending   Will continue to follow.

## 2020-10-26 ENCOUNTER — Other Ambulatory Visit (HOSPITAL_COMMUNITY): Payer: Self-pay

## 2020-10-26 MED ORDER — EPLERENONE 25 MG PO TABS: 12.5000 mg | ORAL_TABLET | Freq: Every day | ORAL | 3 refills | Status: AC

## 2020-10-26 NOTE — Addendum Note (Signed)
Encounter addended by: Scarlette Calico, RN on: 10/26/2020 10:31 AM  Actions taken: Order list changed, Diagnosis association updated

## 2020-10-26 NOTE — Telephone Encounter (Signed)
Advanced Heart Failure Patient Advocate Encounter  Prior Authorization for Eplerenone has been approved.    PA# [id number] Effective dates: 10/25/20 through 07/13/21  Patients co-pay is $27.92 (90 days)  Neville Route (RN), a request to send in a 90 day RX to the patient's pharmacy.   Charlann Boxer, CPhT

## 2020-10-28 LAB — ECHOCARDIOGRAM COMPLETE
AR max vel: 0.78 cm2
AV Area VTI: 0.94 cm2
AV Area mean vel: 0.75 cm2
AV Mean grad: 12 mmHg
AV Peak grad: 19.2 mmHg
Ao pk vel: 2.19 m/s
Area-P 1/2: 1.57 cm2
Calc EF: 36.8 %
S' Lateral: 3.5 cm
Single Plane A2C EF: 36.5 %
Single Plane A4C EF: 40.2 %

## 2020-12-06 ENCOUNTER — Other Ambulatory Visit (HOSPITAL_COMMUNITY): Payer: Self-pay | Admitting: Internal Medicine

## 2021-01-09 DIAGNOSIS — N390 Urinary tract infection, site not specified: Secondary | ICD-10-CM | POA: Diagnosis not present

## 2021-01-10 ENCOUNTER — Encounter (HOSPITAL_COMMUNITY): Payer: Self-pay

## 2021-01-10 ENCOUNTER — Inpatient Hospital Stay (HOSPITAL_COMMUNITY)
Admission: EM | Admit: 2021-01-10 | Discharge: 2021-02-11 | DRG: 698 | Disposition: E | Payer: Medicare Other | Attending: Internal Medicine | Admitting: Internal Medicine

## 2021-01-10 ENCOUNTER — Other Ambulatory Visit: Payer: Self-pay

## 2021-01-10 ENCOUNTER — Emergency Department (HOSPITAL_COMMUNITY): Payer: Medicare Other

## 2021-01-10 DIAGNOSIS — Z20822 Contact with and (suspected) exposure to covid-19: Secondary | ICD-10-CM | POA: Diagnosis present

## 2021-01-10 DIAGNOSIS — N390 Urinary tract infection, site not specified: Secondary | ICD-10-CM | POA: Diagnosis present

## 2021-01-10 DIAGNOSIS — R339 Retention of urine, unspecified: Secondary | ICD-10-CM

## 2021-01-10 DIAGNOSIS — Z888 Allergy status to other drugs, medicaments and biological substances status: Secondary | ICD-10-CM

## 2021-01-10 DIAGNOSIS — E876 Hypokalemia: Secondary | ICD-10-CM | POA: Diagnosis present

## 2021-01-10 DIAGNOSIS — I428 Other cardiomyopathies: Secondary | ICD-10-CM | POA: Diagnosis present

## 2021-01-10 DIAGNOSIS — R6521 Severe sepsis with septic shock: Secondary | ICD-10-CM | POA: Diagnosis present

## 2021-01-10 DIAGNOSIS — Z8673 Personal history of transient ischemic attack (TIA), and cerebral infarction without residual deficits: Secondary | ICD-10-CM

## 2021-01-10 DIAGNOSIS — R918 Other nonspecific abnormal finding of lung field: Secondary | ICD-10-CM | POA: Diagnosis not present

## 2021-01-10 DIAGNOSIS — G4733 Obstructive sleep apnea (adult) (pediatric): Secondary | ICD-10-CM | POA: Diagnosis present

## 2021-01-10 DIAGNOSIS — I5082 Biventricular heart failure: Secondary | ICD-10-CM | POA: Diagnosis present

## 2021-01-10 DIAGNOSIS — Z6831 Body mass index (BMI) 31.0-31.9, adult: Secondary | ICD-10-CM | POA: Diagnosis not present

## 2021-01-10 DIAGNOSIS — Z66 Do not resuscitate: Secondary | ICD-10-CM | POA: Diagnosis present

## 2021-01-10 DIAGNOSIS — E669 Obesity, unspecified: Secondary | ICD-10-CM | POA: Diagnosis present

## 2021-01-10 DIAGNOSIS — J80 Acute respiratory distress syndrome: Secondary | ICD-10-CM | POA: Diagnosis present

## 2021-01-10 DIAGNOSIS — Z8249 Family history of ischemic heart disease and other diseases of the circulatory system: Secondary | ICD-10-CM

## 2021-01-10 DIAGNOSIS — E875 Hyperkalemia: Secondary | ICD-10-CM | POA: Diagnosis not present

## 2021-01-10 DIAGNOSIS — R34 Anuria and oliguria: Secondary | ICD-10-CM | POA: Diagnosis present

## 2021-01-10 DIAGNOSIS — Y846 Urinary catheterization as the cause of abnormal reaction of the patient, or of later complication, without mention of misadventure at the time of the procedure: Secondary | ICD-10-CM | POA: Diagnosis present

## 2021-01-10 DIAGNOSIS — I440 Atrioventricular block, first degree: Secondary | ICD-10-CM | POA: Diagnosis present

## 2021-01-10 DIAGNOSIS — R000 Tachycardia, unspecified: Secondary | ICD-10-CM | POA: Diagnosis not present

## 2021-01-10 DIAGNOSIS — Z87440 Personal history of urinary (tract) infections: Secondary | ICD-10-CM

## 2021-01-10 DIAGNOSIS — E039 Hypothyroidism, unspecified: Secondary | ICD-10-CM | POA: Diagnosis present

## 2021-01-10 DIAGNOSIS — E782 Mixed hyperlipidemia: Secondary | ICD-10-CM | POA: Diagnosis present

## 2021-01-10 DIAGNOSIS — N3090 Cystitis, unspecified without hematuria: Secondary | ICD-10-CM

## 2021-01-10 DIAGNOSIS — E1122 Type 2 diabetes mellitus with diabetic chronic kidney disease: Secondary | ICD-10-CM | POA: Diagnosis present

## 2021-01-10 DIAGNOSIS — J90 Pleural effusion, not elsewhere classified: Secondary | ICD-10-CM | POA: Diagnosis not present

## 2021-01-10 DIAGNOSIS — Z6833 Body mass index (BMI) 33.0-33.9, adult: Secondary | ICD-10-CM | POA: Diagnosis not present

## 2021-01-10 DIAGNOSIS — Z452 Encounter for adjustment and management of vascular access device: Secondary | ICD-10-CM

## 2021-01-10 DIAGNOSIS — E872 Acidosis, unspecified: Secondary | ICD-10-CM | POA: Diagnosis present

## 2021-01-10 DIAGNOSIS — J9811 Atelectasis: Secondary | ICD-10-CM | POA: Diagnosis not present

## 2021-01-10 DIAGNOSIS — Z882 Allergy status to sulfonamides status: Secondary | ICD-10-CM

## 2021-01-10 DIAGNOSIS — N179 Acute kidney failure, unspecified: Secondary | ICD-10-CM | POA: Diagnosis not present

## 2021-01-10 DIAGNOSIS — Z823 Family history of stroke: Secondary | ICD-10-CM

## 2021-01-10 DIAGNOSIS — Z86718 Personal history of other venous thrombosis and embolism: Secondary | ICD-10-CM

## 2021-01-10 DIAGNOSIS — Z515 Encounter for palliative care: Secondary | ICD-10-CM | POA: Diagnosis not present

## 2021-01-10 DIAGNOSIS — I5042 Chronic combined systolic (congestive) and diastolic (congestive) heart failure: Secondary | ICD-10-CM | POA: Diagnosis present

## 2021-01-10 DIAGNOSIS — J9601 Acute respiratory failure with hypoxia: Secondary | ICD-10-CM | POA: Diagnosis not present

## 2021-01-10 DIAGNOSIS — N1831 Chronic kidney disease, stage 3a: Secondary | ICD-10-CM | POA: Diagnosis present

## 2021-01-10 DIAGNOSIS — I252 Old myocardial infarction: Secondary | ICD-10-CM

## 2021-01-10 DIAGNOSIS — Z4682 Encounter for fitting and adjustment of non-vascular catheter: Secondary | ICD-10-CM | POA: Diagnosis not present

## 2021-01-10 DIAGNOSIS — Z7901 Long term (current) use of anticoagulants: Secondary | ICD-10-CM

## 2021-01-10 DIAGNOSIS — Z79890 Hormone replacement therapy (postmenopausal): Secondary | ICD-10-CM

## 2021-01-10 DIAGNOSIS — Z79899 Other long term (current) drug therapy: Secondary | ICD-10-CM

## 2021-01-10 DIAGNOSIS — B952 Enterococcus as the cause of diseases classified elsewhere: Secondary | ICD-10-CM | POA: Diagnosis present

## 2021-01-10 DIAGNOSIS — A419 Sepsis, unspecified organism: Secondary | ICD-10-CM | POA: Diagnosis present

## 2021-01-10 DIAGNOSIS — K3189 Other diseases of stomach and duodenum: Secondary | ICD-10-CM | POA: Diagnosis not present

## 2021-01-10 DIAGNOSIS — Z0189 Encounter for other specified special examinations: Secondary | ICD-10-CM

## 2021-01-10 DIAGNOSIS — N183 Chronic kidney disease, stage 3 unspecified: Secondary | ICD-10-CM | POA: Diagnosis present

## 2021-01-10 DIAGNOSIS — R0602 Shortness of breath: Secondary | ICD-10-CM

## 2021-01-10 DIAGNOSIS — T83518A Infection and inflammatory reaction due to other urinary catheter, initial encounter: Principal | ICD-10-CM | POA: Diagnosis present

## 2021-01-10 DIAGNOSIS — Z7189 Other specified counseling: Secondary | ICD-10-CM | POA: Diagnosis not present

## 2021-01-10 DIAGNOSIS — J9690 Respiratory failure, unspecified, unspecified whether with hypoxia or hypercapnia: Secondary | ICD-10-CM | POA: Diagnosis not present

## 2021-01-10 LAB — PROTIME-INR
INR: 1.3 — ABNORMAL HIGH (ref 0.8–1.2)
Prothrombin Time: 15.7 seconds — ABNORMAL HIGH (ref 11.4–15.2)

## 2021-01-10 LAB — URINALYSIS, ROUTINE W REFLEX MICROSCOPIC
Bilirubin Urine: NEGATIVE
Glucose, UA: 150 mg/dL — AB
Ketones, ur: NEGATIVE mg/dL
Nitrite: NEGATIVE
Protein, ur: NEGATIVE mg/dL
Specific Gravity, Urine: 1.009 (ref 1.005–1.030)
WBC, UA: >50 WBC/hpf — ABNORMAL HIGH (ref 0–5)
pH: 5 (ref 5.0–8.0)

## 2021-01-10 LAB — COMPREHENSIVE METABOLIC PANEL
ALT: 16 U/L (ref 0–44)
AST: 28 U/L (ref 15–41)
Albumin: 4 g/dL (ref 3.5–5.0)
Alkaline Phosphatase: 61 U/L (ref 38–126)
Anion gap: 13 (ref 5–15)
BUN: 24 mg/dL — ABNORMAL HIGH (ref 8–23)
CO2: 21 mmol/L — ABNORMAL LOW (ref 22–32)
Calcium: 9.4 mg/dL (ref 8.9–10.3)
Chloride: 100 mmol/L (ref 98–111)
Creatinine, Ser: 1.58 mg/dL — ABNORMAL HIGH (ref 0.61–1.24)
GFR, Estimated: 43 mL/min — ABNORMAL LOW (ref >60)
Glucose, Bld: 282 mg/dL — ABNORMAL HIGH (ref 70–99)
Potassium: 4.2 mmol/L (ref 3.5–5.1)
Sodium: 134 mmol/L — ABNORMAL LOW (ref 135–145)
Total Bilirubin: 1.3 mg/dL — ABNORMAL HIGH (ref 0.3–1.2)
Total Protein: 7.5 g/dL (ref 6.5–8.1)

## 2021-01-10 LAB — CBC WITH DIFFERENTIAL/PLATELET
Abs Immature Granulocytes: 0.19 10*3/uL — ABNORMAL HIGH (ref 0.00–0.07)
Basophils Absolute: 0.1 10*3/uL (ref 0.0–0.1)
Basophils Relative: 0 %
Eosinophils Absolute: 0 10*3/uL (ref 0.0–0.5)
Eosinophils Relative: 0 %
HCT: 44.4 % (ref 39.0–52.0)
Hemoglobin: 14.5 g/dL (ref 13.0–17.0)
Immature Granulocytes: 1 %
Lymphocytes Relative: 3 %
Lymphs Abs: 0.7 10*3/uL (ref 0.7–4.0)
MCH: 27.6 pg (ref 26.0–34.0)
MCHC: 32.7 g/dL (ref 30.0–36.0)
MCV: 84.6 fL (ref 80.0–100.0)
Monocytes Absolute: 0.9 10*3/uL (ref 0.1–1.0)
Monocytes Relative: 4 %
Neutro Abs: 19.3 10*3/uL — ABNORMAL HIGH (ref 1.7–7.7)
Neutrophils Relative %: 92 %
Platelets: 227 10*3/uL (ref 150–400)
RBC: 5.25 MIL/uL (ref 4.22–5.81)
RDW: 13.5 % (ref 11.5–15.5)
WBC: 21.1 10*3/uL — ABNORMAL HIGH (ref 4.0–10.5)
nRBC: 0 % (ref 0.0–0.2)

## 2021-01-10 LAB — RESP PANEL BY RT-PCR (FLU A&B, COVID) ARPGX2
Influenza A by PCR: NEGATIVE
Influenza B by PCR: NEGATIVE
SARS Coronavirus 2 by RT PCR: NEGATIVE

## 2021-01-10 LAB — LACTIC ACID, PLASMA
Lactic Acid, Venous: 5.5 mmol/L (ref 0.5–1.9)
Lactic Acid, Venous: 4.2 mmol/L (ref 0.5–1.9)
Lactic Acid, Venous: 3.3 mmol/L (ref 0.5–1.9)

## 2021-01-10 LAB — APTT: aPTT: 33 seconds (ref 24–36)

## 2021-01-10 LAB — GLUCOSE, CAPILLARY
Glucose-Capillary: 150 mg/dL — ABNORMAL HIGH (ref 70–99)
Glucose-Capillary: 187 mg/dL — ABNORMAL HIGH (ref 70–99)

## 2021-01-10 MED ORDER — APIXABAN 2.5 MG PO TABS
2.5000 mg | ORAL_TABLET | Freq: Two times a day (BID) | ORAL | Status: DC
  Administered 2021-01-10 – 2021-01-11 (×3): 2.5 mg via ORAL
  Filled 2021-01-10 (×3): qty 1

## 2021-01-10 MED ORDER — LACTATED RINGERS IV BOLUS (SEPSIS)
1000.0000 mL | Freq: Once | INTRAVENOUS | Status: AC
  Administered 2021-01-10: 1000 mL via INTRAVENOUS

## 2021-01-10 MED ORDER — LACTATED RINGERS IV SOLN: INTRAVENOUS | Status: DC

## 2021-01-10 MED ORDER — SODIUM CHLORIDE 0.9 % IV SOLN
2.0000 g | Freq: Once | INTRAVENOUS | Status: AC
  Administered 2021-01-10: 2 g via INTRAVENOUS
  Filled 2021-01-10: qty 2

## 2021-01-10 MED ORDER — IBUPROFEN 800 MG PO TABS
800.0000 mg | ORAL_TABLET | Freq: Once | ORAL | Status: AC
  Administered 2021-01-10: 800 mg via ORAL
  Filled 2021-01-10: qty 1

## 2021-01-10 MED ORDER — SODIUM CHLORIDE 0.9 % IV SOLN
1.0000 g | Freq: Once | INTRAVENOUS | Status: AC
  Administered 2021-01-10: 1 g via INTRAVENOUS
  Filled 2021-01-10: qty 10

## 2021-01-10 MED ORDER — ACETAMINOPHEN 650 MG RE SUPP
650.0000 mg | Freq: Four times a day (QID) | RECTAL | Status: DC | PRN
  Administered 2021-01-11 – 2021-01-13 (×3): 650 mg via RECTAL
  Filled 2021-01-10 (×3): qty 1

## 2021-01-10 MED ORDER — LEVOTHYROXINE SODIUM 75 MCG PO TABS
75.0000 ug | ORAL_TABLET | Freq: Every day | ORAL | Status: DC
  Administered 2021-01-11: 75 ug via ORAL
  Filled 2021-01-10 (×2): qty 1

## 2021-01-10 MED ORDER — RISAQUAD PO CAPS
1.0000 | ORAL_CAPSULE | Freq: Every day | ORAL | Status: DC
  Administered 2021-01-11: 1 via ORAL
  Filled 2021-01-10: qty 1

## 2021-01-10 MED ORDER — LACTATED RINGERS IV BOLUS (SEPSIS)
1000.0000 mL | Freq: Once | INTRAVENOUS | Status: AC
Start: 2021-01-10 — End: 2021-01-10
  Administered 2021-01-10: 1000 mL via INTRAVENOUS

## 2021-01-10 MED ORDER — SODIUM CHLORIDE 0.9 % IV SOLN
2.0000 g | Freq: Two times a day (BID) | INTRAVENOUS | Status: DC
  Administered 2021-01-11 – 2021-01-12 (×3): 2 g via INTRAVENOUS
  Filled 2021-01-10 (×4): qty 2

## 2021-01-10 MED ORDER — ACETAMINOPHEN 325 MG PO TABS
650.0000 mg | ORAL_TABLET | Freq: Four times a day (QID) | ORAL | Status: DC | PRN
  Administered 2021-01-10 – 2021-01-11 (×2): 650 mg via ORAL
  Filled 2021-01-10 (×2): qty 2

## 2021-01-10 MED ORDER — ONDANSETRON HCL 4 MG/2ML IJ SOLN: 4.0000 mg | Freq: Four times a day (QID) | INTRAMUSCULAR | Status: DC | PRN

## 2021-01-10 MED ORDER — FINASTERIDE 5 MG PO TABS
5.0000 mg | ORAL_TABLET | Freq: Every day | ORAL | Status: DC
  Administered 2021-01-11: 5 mg via ORAL
  Filled 2021-01-10: qty 1

## 2021-01-10 MED ORDER — ONDANSETRON HCL 4 MG PO TABS
4.0000 mg | ORAL_TABLET | Freq: Four times a day (QID) | ORAL | Status: DC | PRN
  Administered 2021-01-11: 4 mg via ORAL
  Filled 2021-01-10: qty 1

## 2021-01-10 MED ORDER — VITAMIN D 25 MCG (1000 UNIT) PO TABS
2000.0000 [IU] | ORAL_TABLET | Freq: Two times a day (BID) | ORAL | Status: DC
  Administered 2021-01-10 – 2021-01-11 (×3): 2000 [IU] via ORAL
  Filled 2021-01-10 (×3): qty 2

## 2021-01-10 MED ORDER — INSULIN ASPART 100 UNIT/ML IJ SOLN
0.0000 [IU] | Freq: Three times a day (TID) | INTRAMUSCULAR | Status: DC
  Administered 2021-01-10 – 2021-01-11 (×2): 2 [IU] via SUBCUTANEOUS
  Administered 2021-01-11: 3 [IU] via SUBCUTANEOUS
  Administered 2021-01-11: 2 [IU] via SUBCUTANEOUS
  Filled 2021-01-10: qty 0.15

## 2021-01-10 MED ORDER — CHLORHEXIDINE GLUCONATE CLOTH 2 % EX PADS
6.0000 | MEDICATED_PAD | Freq: Every day | CUTANEOUS | Status: DC
  Administered 2021-01-11 – 2021-01-12 (×2): 6 via TOPICAL

## 2021-01-10 NOTE — H&P (Signed)
History and Physical    Michael Randolph AYT:[id number] DOB: March 31, 1935 DOA: 12/16/2020  PCP: Shon Baton, MD   Patient coming from: Home  I have personally briefly reviewed patient's old medical records in [place]  Chief Complaint: Fever  HPI: Michael Randolph is a 86 y.o. male with medical history significant for diabetes mellitus with complications of stage III chronic kidney disease, chronic combined systolic and diastolic dysfunction CHF, history of DVT, urinary retention and does straight caths at home who presents to the ER with his wife for evaluation of dysuria and fever.  Patient states that he gets frequent urinary tract infections and that 1 day prior to his admission he developed burning with micturition and so called his primary care provider.  He was prescribed nitrofurantoin and has taken 2 doses. On the day of admission his wife states that he developed a fever with a T-max of 102 for which she administered Tylenol and upon his arrival to the ER his temp was 100.2.  She also states that he was tachycardic with heart rate in the 100's.  She attempted to call his primary care provider but when they did not respond she drove him to the emergency room. He complains of dysuria but denies having any hematuria.  He denies having any abdominal pain, no chest pain, no shortness of breath, no dizziness, no lightheadedness, no headache, no nausea, no vomiting, no diaphoresis, no palpitations, no cough. Labs show sodium 134, potassium 4.2, chloride 100, bicarb 21, glucose 282, BUN 24, creatinine 1.58, alkaline phosphatase 61, albumin 4.0, AST 28, ALT 16, total protein 7.5, total bilirubin 1.3, lactic acid 5.5 >> 4.2, white count 21,000, hemoglobin 14.5, hematocrit 44, MCV 84.6, RDW 13.5, platelet count 227 Respiratory viral panel is negative Urine analysis shows pyuria Chest x-ray reviewed by me shows low lung volumes with bibasilar atelectasis. Twelve-lead EKG reviewed by me shows  sinus tachycardia with first-degree AV block, PACs and LVH   ED Course: Patient is an 86 year old Caucasian male who presents to the emergency room for evaluation of fever and dysuria.  He has a history of recurrent UTIs because he does self cath at home for urinary retention. He was started on Macrobid by his primary care provider without any improvement in his symptoms. Lactic acid level was 5.5 and patient has marked leukocytosis with pyuria.  He had a low-grade fever upon arrival to the ER, 100.2 but his wife states that at home he was 35 and he had received Tylenol prior to coming to the ER.  He was also tachycardic. He received a dose of Rocephin 1 g IV in the ER as well as his sepsis fluid requirement. He will be admitted to the hospital for further evaluation.  Review of Systems: As per HPI otherwise all other systems reviewed and negative.    Past Medical History:  Diagnosis Date   Congestive heart failure (CHF) ([place])    Diabetes mellitus without complication ([place])    no medications   Diverticulosis    DVT (deep venous thrombosis) (HCC)    Heart disease    Hyperlipemia    Kidney stone    NICM (nonischemic cardiomyopathy) (HCC)    OSA (obstructive sleep apnea)    Stroke ([place])    TIA (transient ischemic attack) 2008   was seen on CT    Toe infection    UTI (urinary tract infection) 10/12/2020    Past Surgical History:  Procedure Laterality Date   CARDIAC CATHETERIZATION N/A  12/01/2014   Procedure: Right/Left Heart Cath and Coronary Angiography;  Surgeon: Jettie Booze, MD;  Location: [facility];  Service: Cardiovascular;  Laterality: N/A;   COLONOSCOPY  2013   ROTATOR CUFF REPAIR Right      reports that he has never smoked. He has never used smokeless tobacco. He reports that he does not drink alcohol and does not use drugs.  Allergies  Allergen Reactions   Actos [Pioglitazone] Other (See Comments)    Slow HR and pain in face    Anesthetics,  Halogenated Other (See Comments)    Slept too long   Crestor [Rosuvastatin Calcium]     Pain    Sulfa Antibiotics Other (See Comments)    Hallucinations    Dye Fdc Red [Red Dye] Rash    # 64    Family History  Problem Relation Age of Onset   Stroke Mother    Hypertension Mother    Heart attack Father    Deep vein thrombosis Father    Crohn's disease Brother    Schizophrenia Sister       Prior to Admission medications   Medication Sig Start Date End Date Taking? Authorizing Provider  apixaban (ELIQUIS) 2.5 MG TABS tablet Take 2.5 mg by mouth 2 (two) times daily.    [provider]  Cholecalciferol 2000 UNITS CAPS Take 2,000 Units by mouth 2 (two) times daily.     [provider]  eplerenone (INSPRA) 25 MG tablet Take 0.5 tablets (12.5 mg total) by mouth daily. 10/26/20   Bensimhon, Shaune Pascal, MD  finasteride (PROSCAR) 5 MG tablet Take 5 mg by mouth daily.    [provider]  GARLIC PO Take 1 tablet by mouth daily.    [provider]  levothyroxine (SYNTHROID) 75 MCG tablet Take 75 mcg by mouth daily. 09/05/20   [provider]  losartan (COZAAR) 25 MG tablet Take 1 tablet daily 10/24/20   Bensimhon, Shaune Pascal, MD  MAGNESIUM PO Take 1 tablet by mouth 2 (two) times daily.     [provider]  Methylsulfonylmethane (MSM) 1000 MG CAPS Take 1,000 mg by mouth daily.     [provider]  Probiotic Product (PROBIOTIC-10 PO) Take 1 tablet by mouth daily.    [provider]  sodium chloride (OCEAN) 0.65 % SOLN nasal spray Place 1 spray into both nostrils as needed for congestion.    [provider]  torsemide (DEMADEX) 20 MG tablet Take 1 tablet (20 mg total) by mouth daily. 12/06/20   Bensimhon, Shaune Pascal, MD    Physical Exam: Vitals:   12/15/2020 1330 12/23/2020 1400 01/07/2021 1420 01/06/2021 1500  BP: 112/70 114/66  (!) 90/53  Pulse: 83 88  80  Resp: (!) 21 (!) 23  (!) 25  Temp:   98.2 F (36.8 C)   TempSrc:    Oral   SpO2: 91% 90%  92%  Weight:      Height:         Vitals:   12/22/2020 1330 12/23/2020 1400 12/25/2020 1420 12/16/2020 1500  BP: 112/70 114/66  (!) 90/53  Pulse: 83 88  80  Resp: (!) 21 (!) 23  (!) 25  Temp:   98.2 F (36.8 C)   TempSrc:   Oral   SpO2: 91% 90%  92%  Weight:      Height:          Constitutional: Alert and oriented x 3 . Not in any apparent distress  HEENT:      Head: Normocephalic and atraumatic.         Eyes: PERLA, EOMI, Conjunctivae are normal. Sclera is non-icteric.       Mouth/Throat: Mucous membranes are moist.       Neck: Supple with no signs of meningismus. Cardiovascular: Regular rate and rhythm. No murmurs, gallops, or rubs. 2+ symmetrical distal pulses are present . No JVD. No LE edema Respiratory: Respiratory effort normal .Lungs sounds clear bilaterally. No wheezes, crackles, or rhonchi.  Gastrointestinal: Soft, non tender, and non distended with positive bowel sounds.  Genitourinary: No CVA tenderness. Musculoskeletal: Nontender with normal range of motion in all extremities. No cyanosis, or erythema of extremities. Neurologic:  Face is symmetric. Moving all extremities. No gross focal neurologic deficits . Skin: Skin is warm, dry.  No rash or ulcers Psychiatric: Mood and affect are normal    Labs on Admission: I have personally reviewed following labs and imaging studies  CBC: Recent Labs  Lab 12/30/2020 1059  WBC 21.1*  NEUTROABS 19.3*  HGB 14.5  HCT 44.4  MCV 84.6  PLT 811   Basic Metabolic Panel: Recent Labs  Lab 01/06/2021 1059  NA 134*  K 4.2  CL 100  CO2 21*  GLUCOSE 282*  BUN 24*  CREATININE 1.58*  CALCIUM 9.4   GFR: Estimated Creatinine Clearance: 37.4 mL/min (A) (by C-G formula based on SCr of 1.58 mg/dL (H)). Liver Function Tests: Recent Labs  Lab 12/12/2020 1059  AST 28  ALT 16  ALKPHOS 61  BILITOT 1.3*  PROT 7.5  ALBUMIN 4.0   No results for input(s): LIPASE, AMYLASE in the last 168 hours. No results for  input(s): AMMONIA in the last 168 hours. Coagulation Profile: Recent Labs  Lab 12/30/2020 1059  INR 1.3*   Cardiac Enzymes: No results for input(s): CKTOTAL, CKMB, CKMBINDEX, TROPONINI in the last 168 hours. BNP (last 3 results) No results for input(s): PROBNP in the last 8760 hours. HbA1C: No results for input(s): HGBA1C in the last 72 hours. CBG: No results for input(s): GLUCAP in the last 168 hours. Lipid Profile: No results for input(s): CHOL, HDL, LDLCALC, TRIG, CHOLHDL, LDLDIRECT in the last 72 hours. Thyroid Function Tests: No results for input(s): TSH, T4TOTAL, FREET4, T3FREE, THYROIDAB in the last 72 hours. Anemia Panel: No results for input(s): VITAMINB12, FOLATE, FERRITIN, TIBC, IRON, RETICCTPCT in the last 72 hours. Urine analysis:    Component Value Date/Time   COLORURINE YELLOW 12/25/2020 1428   APPEARANCEUR CLOUDY (A) 12/29/2020 1428   LABSPEC 1.009 12/22/2020 1428   PHURINE 5.0 01/02/2021 1428   GLUCOSEU 150 (A) 12/23/2020 1428   GLUCOSEU NEGATIVE 01/01/2015 1130   HGBUR SMALL (A) 12/22/2020 1428   BILIRUBINUR NEGATIVE 12/19/2020 1428   KETONESUR NEGATIVE 12/24/2020 1428   PROTEINUR NEGATIVE 01/08/2021 1428   UROBILINOGEN 0.2 01/01/2015 1130   NITRITE NEGATIVE 12/15/2020 1428   LEUKOCYTESUR LARGE (A) 12/20/2020 1428    Radiological Exams on Admission: DG Chest Port 1 View  Result Date: 12/31/2020 CLINICAL DATA:  Questionable sepsis. EXAM: PORTABLE CHEST 1 VIEW COMPARISON:  10/12/2020. FINDINGS: Mediastinum hilar structures normal. Heart size stable. No pulmonary venous congestion. Low lung volumes. Mild bibasilar atelectasis. No pleural effusion or pneumothorax. Postsurgical changes right shoulder. IMPRESSION: Lung volumes with mild bibasilar atelectasis. Electronically Signed   By: Marcello Moores  Register   On: 01/06/2021 11:29     Assessment/Plan Principal Problem:   Sepsis secondary to UTI [facility]) Active Problems:   Chronic combined systolic and diastolic CHF  (  congestive heart failure) (HCC)   NICM (nonischemic cardiomyopathy) ([place])   History of DVT (deep vein thrombosis)   Hypothyroidism   CKD stage 3 due to type 2 diabetes mellitus ([place])      Sepsis secondary to UTI (POA) Patient presents to the emergency room for evaluation of dysuria and fever. He does self cath at home and is predisposed to frequent UTIs He had a temperature of 100.2, he was tachycardic with heart rate of 712, systolic blood pressure of 89, marked leukocytosis, pyuria and elevated lactic acid levels. Patient received sepsis IV fluid bolus in the ER with improvement in his blood pressure Continue judicious IV fluid resuscitation and monitor respiratory status closely Will place patient on cefepime adjusted to renal function Follow-up results of urine culture    Chronic combined systolic and diastolic dysfunction CHF Last known LVEF of 25 to 30% with LV global hypokinesis Hold spironolactone, Cozaar and torsemide due to relative hypotension Monitor respiratory status closely    Hypothyroidism Continue Synthroid    History of DVT Continue apixaban    Diabetes mellitus with complications of stage III chronic kidney disease Renal function is stable Maintain consistent carbohydrate diet Glycemic control with sliding scale insulin   DVT prophylaxis: Apixaban Code Status: full code  Family Communication: Greater than 50% of time was spent discussing patient's condition and plan of care with him and his wife at the bedside.  All questions and concerns have been addressed.  They verbalized understanding and agree with the plan. Disposition Plan: Back to previous home environment Consults called: none  Status: At the time of admission, it appears that the appropriate admission status for this patient is inpatient. This is judged to be reasonable and necessary in order to provide the required intensity of service to ensure the patient's safety given the  presenting symptoms, physical exam findings and initial radiographic and laboratory data in the context of their comorbid conditions. Patient requires inpatient status due to high intensity of service, high risk for further deterioration and high frequency of surveillance required.    Collier Bullock MD [facility]     12/14/2020, 3:52 PM

## 2021-01-10 NOTE — Sepsis Progress Note (Signed)
Code sepsis protocol being monitored by eLink. 

## 2021-01-10 NOTE — ED Notes (Signed)
Performed straight cath on pt, 1282ml urine output w catheter, provider aware

## 2021-01-10 NOTE — ED Triage Notes (Signed)
Pt c/o dysuria and fever x 1 day- was dx w UTI yesterday and prescribed medication- took 1 dose last night and 1 dose this am. Woke up this am w a fever of 102, took tylenol at 0900, temp now 100.2. Pt states he caths himself at home every day. Denies hematuria

## 2021-01-10 NOTE — Progress Notes (Signed)
Pharmacy Antibiotic Note  Michael Randolph is a 86 y.o. male admitted on 12/25/2020 with UTI.  Pharmacy has been consulted for Cefepime dosing.  Plan: Cefepime 2g IV q12h Follow up renal function, culture results, and clinical course.   Height: 5\' 7"  (170.2 cm) Weight: 90.6 kg (199 lb 12.8 oz) IBW/kg (Calculated) : 66.1  Temp (24hrs), Avg:98.9 F (37.2 C), Min:98.2 F (36.8 C), Max:100.2 F (37.9 C)  Recent Labs  Lab 12/13/2020 1059 12/17/2020 1210  WBC 21.1*  --   CREATININE 1.58*  --   LATICACIDVEN 5.5* 4.2*    Estimated Creatinine Clearance: 37.4 mL/min (A) (by C-G formula based on SCr of 1.58 mg/dL (H)).    Allergies  Allergen Reactions   Actos [Pioglitazone] Other (See Comments)    Slow HR and pain in face    Anesthetics, Halogenated Other (See Comments)    Slept too long   Crestor [Rosuvastatin Calcium]     Pain    Sulfa Antibiotics Other (See Comments)    Hallucinations    Dye Fdc Red [Red Dye] Rash    # 40    Antimicrobials this admission: 6/30 Ceftriaxone x1 6/30 Cefepime >>   Dose adjustments this admission:   Microbiology results: 6/30 BCx: 6/30 UCx:   Thank you for allowing pharmacy to be a part of this patient's care.  Gretta Arab PharmD, BCPS Clinical Pharmacist [facility] main pharmacy [phone] 12/30/2020 4:17 PM

## 2021-01-10 NOTE — ED Provider Notes (Signed)
[facility] DEPT Provider Note   CSN: [id number] Arrival date & time: 01/08/2021  5732     History Chief Complaint  Patient presents with   Dysuria    Michael Randolph is a 86 y.o. male.  Pt presents to the ED today with dysruia and fever.  He has a hx of self-cathing and was diagnosed with a UTI yesterday.  He was put on Macrobid.  He took 1 dose last night and 1 dose this morning.  He woke up in the night with shaking chills.  He had a fever this am around 0900 and took 1 g tylenol.  Pt denies sob or cough.      Past Medical History:  Diagnosis Date   Congestive heart failure (CHF) ([place])    Diabetes mellitus without complication (HCC)    no medications   Diverticulosis    DVT (deep venous thrombosis) (HCC)    Heart disease    Hyperlipemia    Kidney stone    NICM (nonischemic cardiomyopathy) (HCC)    OSA (obstructive sleep apnea)    Stroke ([place])    TIA (transient ischemic attack) 2008   was seen on CT    Toe infection    UTI (urinary tract infection) 10/12/2020    Patient Active Problem List   Diagnosis Date Noted   CKD (chronic kidney disease) stage 3, GFR 30-59 ml/min ([place]) 10/12/2020   History of DVT (deep vein thrombosis) 10/12/2020   Hypothyroidism 10/12/2020   Acute delirium 08/09/2017   NICM (nonischemic cardiomyopathy) ([place]) 08/09/2017   Acute lower UTI 08/09/2017   Frequent PVCs    Chronic combined systolic and diastolic CHF (congestive heart failure) ([place]) 07/25/2017   Mixed hyperlipidemia 03/11/2017   NSVT (nonsustained ventricular tachycardia) ([place]) 05/19/2016   Acute on chronic systolic (congestive) heart failure ([place]) 05/19/2016   Sepsis ([place]) 05/16/2016   CAP (community acquired pneumonia) 05/16/2016   AKI (acute kidney injury) ([place]) 05/16/2016   Elevated troponin    NSTEMI (non-ST elevated myocardial infarction) ([place])    Diabetes mellitus without complication ([place]) 20/25/4270   DVT (deep venous thrombosis) (HCC)     TIA (transient ischemic attack)     Past Surgical History:  Procedure Laterality Date   CARDIAC CATHETERIZATION N/A 12/01/2014   Procedure: Right/Left Heart Cath and Coronary Angiography;  Surgeon: Jettie Booze, MD;  Location: [facility];  Service: Cardiovascular;  Laterality: N/A;   COLONOSCOPY  2013   ROTATOR CUFF REPAIR Right        Family History  Problem Relation Age of Onset   Stroke Mother    Hypertension Mother    Heart attack Father    Deep vein thrombosis Father    Crohn's disease Brother    Schizophrenia Sister     Social History   Tobacco Use   Smoking status: Never   Smokeless tobacco: Never  Vaping Use   Vaping Use: Never used  Substance Use Topics   Alcohol use: No    Alcohol/week: 0.0 standard drinks   Drug use: No    Home Medications Prior to Admission medications   Medication Sig Start Date End Date Taking? Authorizing Provider  apixaban (ELIQUIS) 2.5 MG TABS tablet Take 2.5 mg by mouth 2 (two) times daily.    [provider]  Cholecalciferol 2000 UNITS CAPS Take 2,000 Units by mouth 2 (two) times daily.     [provider]  eplerenone (INSPRA) 25 MG tablet Take 0.5 tablets (12.5 mg  total) by mouth daily. 10/26/20   Bensimhon, Shaune Pascal, MD  finasteride (PROSCAR) 5 MG tablet Take 5 mg by mouth daily.    [provider]  GARLIC PO Take 1 tablet by mouth daily.    [provider]  levothyroxine (SYNTHROID) 75 MCG tablet Take 75 mcg by mouth daily. 09/05/20   [provider]  losartan (COZAAR) 25 MG tablet Take 1 tablet daily 10/24/20   Bensimhon, Shaune Pascal, MD  MAGNESIUM PO Take 1 tablet by mouth 2 (two) times daily.     [provider]  Methylsulfonylmethane (MSM) 1000 MG CAPS Take 1,000 mg by mouth daily.     [provider]  Probiotic Product (PROBIOTIC-10 PO) Take 1 tablet by mouth daily.    [provider]  sodium chloride (OCEAN) 0.65 % SOLN nasal spray Place 1  spray into both nostrils as needed for congestion.    [provider]  torsemide (DEMADEX) 20 MG tablet Take 1 tablet (20 mg total) by mouth daily. 12/06/20   Bensimhon, Shaune Pascal, MD    Allergies    Actos [pioglitazone]; Anesthetics, halogenated; Crestor [rosuvastatin calcium]; Sulfa antibiotics; and Dye fdc red [red dye]  Review of Systems   Review of Systems  Constitutional:  Positive for chills and fever.  Genitourinary:  Positive for dysuria.  All other systems reviewed and are negative.  Physical Exam Updated Vital Signs BP (!) 90/53   Pulse 80   Temp 98.2 F (36.8 C) (Oral)   Resp (!) 25   Ht 5\' 7"  (1.702 m)   Wt 90.6 kg   SpO2 92%   BMI 31.29 kg/m   Physical Exam Vitals and nursing note reviewed.  Constitutional:      Appearance: Normal appearance. He is obese.  HENT:     Head: Normocephalic and atraumatic.     Right Ear: External ear normal.     Left Ear: External ear normal.     Nose: Nose normal.     Mouth/Throat:     Mouth: Mucous membranes are dry.  Eyes:     Extraocular Movements: Extraocular movements intact.     Conjunctiva/sclera: Conjunctivae normal.     Pupils: Pupils are equal, round, and reactive to light.  Cardiovascular:     Rate and Rhythm: Regular rhythm. Tachycardia present.     Pulses: Normal pulses.     Heart sounds: Normal heart sounds.  Pulmonary:     Effort: Pulmonary effort is normal.     Breath sounds: Normal breath sounds.  Abdominal:     General: Abdomen is flat. Bowel sounds are normal.     Palpations: Abdomen is soft.  Musculoskeletal:        General: Normal range of motion.     Cervical back: Normal range of motion and neck supple.  Skin:    General: Skin is warm.     Capillary Refill: Capillary refill takes less than 2 seconds.  Neurological:     General: No focal deficit present.     Mental Status: He is alert and oriented to person, place, and time.     Motor: Tremor present.  Psychiatric:        Mood and  Affect: Mood normal.        Behavior: Behavior normal.        Thought Content: Thought content normal.        Judgment: Judgment normal.    ED Results / Procedures / Treatments   Labs (all labs ordered  are listed, but only abnormal results are displayed) Labs Reviewed  LACTIC ACID, PLASMA - Abnormal; Notable for the following components:      Result Value   Lactic Acid, Venous 5.5 (*)    All other components within normal limits  LACTIC ACID, PLASMA - Abnormal; Notable for the following components:   Lactic Acid, Venous 4.2 (*)    All other components within normal limits  COMPREHENSIVE METABOLIC PANEL - Abnormal; Notable for the following components:   Sodium 134 (*)    CO2 21 (*)    Glucose, Bld 282 (*)    BUN 24 (*)    Creatinine, Ser 1.58 (*)    Total Bilirubin 1.3 (*)    GFR, Estimated 43 (*)    All other components within normal limits  CBC WITH DIFFERENTIAL/PLATELET - Abnormal; Notable for the following components:   WBC 21.1 (*)    Neutro Abs 19.3 (*)    Abs Immature Granulocytes 0.19 (*)    All other components within normal limits  PROTIME-INR - Abnormal; Notable for the following components:   Prothrombin Time 15.7 (*)    INR 1.3 (*)    All other components within normal limits  RESP PANEL BY RT-PCR (FLU A&B, COVID) ARPGX2  CULTURE, BLOOD (SINGLE)  URINE CULTURE  APTT  URINALYSIS, ROUTINE W REFLEX MICROSCOPIC    EKG EKG Interpretation  Date/Time:  Thursday January 10 2021 10:23:51 EDT Ventricular Rate:  110 PR Interval:  226 QRS Duration: 136 QT Interval:  320 QTC Calculation: 433 R Axis:   -6 Text Interpretation: Sinus tachycardia with 1st degree A-V block with Premature atrial complexes Possible Left atrial enlargement Left ventricular hypertrophy with QRS widening ( Cornell product ) T wave abnormality, consider lateral ischemia Abnormal ECG Since last tracing rate faster Confirmed by Isla Pence [phone]) on 12/20/2020 11:18:13 AM  Radiology DG  Chest Port 1 View  Result Date: 12/24/2020 CLINICAL DATA:  Questionable sepsis. EXAM: PORTABLE CHEST 1 VIEW COMPARISON:  10/12/2020. FINDINGS: Mediastinum hilar structures normal. Heart size stable. No pulmonary venous congestion. Low lung volumes. Mild bibasilar atelectasis. No pleural effusion or pneumothorax. Postsurgical changes right shoulder. IMPRESSION: Lung volumes with mild bibasilar atelectasis. Electronically Signed   By: Marcello Moores  Register   On: 01/07/2021 11:29    Procedures Procedures   Medications Ordered in ED Medications  lactated ringers infusion (0 mLs Intravenous Hold 12/30/2020 1433)  lactated ringers bolus 1,000 mL (0 mLs Intravenous Stopped 12/19/2020 1432)    And  lactated ringers bolus 1,000 mL (1,000 mLs Intravenous New Bag/Given 12/18/2020 1432)    And  lactated ringers bolus 1,000 mL (has no administration in time range)  lactated ringers bolus 1,000 mL (0 mLs Intravenous Stopped 12/16/2020 1312)  ibuprofen (ADVIL) tablet 800 mg (800 mg Oral Given 12/15/2020 1045)  cefTRIAXone (ROCEPHIN) 1 g in sodium chloride 0.9 % 100 mL IVPB (0 g Intravenous Stopped 01/08/2021 1136)    ED Course  I have reviewed the triage vital signs and the nursing notes.  Pertinent labs & imaging results that were available during my care of the patient were reviewed by me and considered in my medical decision making (see chart for details).    MDM Rules/Calculators/A&P                          Sepsis suspected when pt arrived, so fluids and rocephin ordered.  With lactic acid elevation, code sepsis called.  It took a long time  for pt to produce a urine.  Pt is feeling better with treatment and lactic acid is down-trending.  When pt was cath'd, he had about 1200 ml in bladder.  Pt d/w Dr. Francine Graven (triad) who will admit.  CRITICAL CARE Performed by: Isla Pence   Total critical care time: 30 minutes  Critical care time was exclusive of separately billable procedures and treating other  patients.  Critical care was necessary to treat or prevent imminent or life-threatening deterioration.  Critical care was time spent personally by me on the following activities: development of treatment plan with patient and/or surrogate as well as nursing, discussions with consultants, evaluation of patient's response to treatment, examination of patient, obtaining history from patient or surrogate, ordering and performing treatments and interventions, ordering and review of laboratory studies, ordering and review of radiographic studies, pulse oximetry and re-evaluation of patient's condition.   Michael Randolph was evaluated in Emergency Department on 12/15/2020 for the symptoms described in the history of present illness. He was evaluated in the context of the global COVID-19 pandemic, which necessitated consideration that the patient might be at risk for infection with the SARS-CoV-2 virus that causes COVID-19. Institutional protocols and algorithms that pertain to the evaluation of patients at risk for COVID-19 are in a state of rapid change based on information released by regulatory bodies including the CDC and federal and state organizations. These policies and algorithms were followed during the patient's care in the ED.   Final Clinical Impression(s) / ED Diagnoses Final diagnoses:  Sepsis without acute organ dysfunction, due to unspecified organism [facility])  Urinary retention    Rx / DC Orders ED Discharge Orders     None        Isla Pence, MD 01/09/2021 1527

## 2021-01-10 NOTE — Plan of Care (Signed)

## 2021-01-11 ENCOUNTER — Inpatient Hospital Stay (HOSPITAL_COMMUNITY): Payer: Medicare Other

## 2021-01-11 DIAGNOSIS — A419 Sepsis, unspecified organism: Secondary | ICD-10-CM | POA: Diagnosis not present

## 2021-01-11 DIAGNOSIS — N390 Urinary tract infection, site not specified: Secondary | ICD-10-CM | POA: Diagnosis not present

## 2021-01-11 DIAGNOSIS — J80 Acute respiratory distress syndrome: Secondary | ICD-10-CM

## 2021-01-11 LAB — BASIC METABOLIC PANEL
Anion gap: 8 (ref 5–15)
Anion gap: 11 (ref 5–15)
BUN: 23 mg/dL (ref 8–23)
BUN: 29 mg/dL — ABNORMAL HIGH (ref 8–23)
CO2: 24 mmol/L (ref 22–32)
CO2: 19 mmol/L — ABNORMAL LOW (ref 22–32)
Calcium: 8.5 mg/dL — ABNORMAL LOW (ref 8.9–10.3)
Calcium: 8.6 mg/dL — ABNORMAL LOW (ref 8.9–10.3)
Chloride: 102 mmol/L (ref 98–111)
Chloride: 103 mmol/L (ref 98–111)
Creatinine, Ser: 1.56 mg/dL — ABNORMAL HIGH (ref 0.61–1.24)
Creatinine, Ser: 2 mg/dL — ABNORMAL HIGH (ref 0.61–1.24)
GFR, Estimated: 44 mL/min — ABNORMAL LOW (ref >60)
GFR, Estimated: 32 mL/min — ABNORMAL LOW (ref >60)
Glucose, Bld: 172 mg/dL — ABNORMAL HIGH (ref 70–99)
Glucose, Bld: 220 mg/dL — ABNORMAL HIGH (ref 70–99)
Potassium: 4.5 mmol/L (ref 3.5–5.1)
Potassium: 4.4 mmol/L (ref 3.5–5.1)
Sodium: 134 mmol/L — ABNORMAL LOW (ref 135–145)
Sodium: 133 mmol/L — ABNORMAL LOW (ref 135–145)

## 2021-01-11 LAB — COOXEMETRY PANEL
Carboxyhemoglobin: 1 % (ref 0.5–1.5)
Methemoglobin: 0.6 % (ref 0.0–1.5)
O2 Saturation: 83.2 %
Total hemoglobin: 16.8 g/dL — ABNORMAL HIGH (ref 12.0–16.0)

## 2021-01-11 LAB — BLOOD GAS, ARTERIAL
Acid-base deficit: 8 mmol/L — ABNORMAL HIGH (ref 0.0–2.0)
Acid-base deficit: 15.2 mmol/L — ABNORMAL HIGH (ref 0.0–2.0)
Bicarbonate: 19.6 mmol/L — ABNORMAL LOW (ref 20.0–28.0)
Bicarbonate: 11.8 mmol/L — ABNORMAL LOW (ref 20.0–28.0)
FIO2: 100
O2 Saturation: 82.7 %
O2 Saturation: 88.4 %
Patient temperature: 37
Patient temperature: 38.1
pCO2 arterial: 48.3 mmHg — ABNORMAL HIGH (ref 32.0–48.0)
pCO2 arterial: 31.5 mmHg — ABNORMAL LOW (ref 32.0–48.0)
pH, Arterial: 7.232 — ABNORMAL LOW (ref 7.350–7.450)
pH, Arterial: 7.199 — CL (ref 7.350–7.450)
pO2, Arterial: 55.8 mmHg — ABNORMAL LOW (ref 83.0–108.0)
pO2, Arterial: 64.5 mmHg — ABNORMAL LOW (ref 83.0–108.0)

## 2021-01-11 LAB — GLUCOSE, CAPILLARY
Glucose-Capillary: 147 mg/dL — ABNORMAL HIGH (ref 70–99)
Glucose-Capillary: 129 mg/dL — ABNORMAL HIGH (ref 70–99)
Glucose-Capillary: 176 mg/dL — ABNORMAL HIGH (ref 70–99)
Glucose-Capillary: 170 mg/dL — ABNORMAL HIGH (ref 70–99)
Glucose-Capillary: 128 mg/dL — ABNORMAL HIGH (ref 70–99)

## 2021-01-11 LAB — CBC
HCT: 41.1 % (ref 39.0–52.0)
Hemoglobin: 13.4 g/dL (ref 13.0–17.0)
MCH: 27.4 pg (ref 26.0–34.0)
MCHC: 32.6 g/dL (ref 30.0–36.0)
MCV: 84 fL (ref 80.0–100.0)
Platelets: 191 10*3/uL (ref 150–400)
RBC: 4.89 MIL/uL (ref 4.22–5.81)
RDW: 13.5 % (ref 11.5–15.5)
WBC: 19.4 10*3/uL — ABNORMAL HIGH (ref 4.0–10.5)
nRBC: 0 % (ref 0.0–0.2)

## 2021-01-11 LAB — PROTIME-INR
INR: 1.8 — ABNORMAL HIGH (ref 0.8–1.2)
Prothrombin Time: 20.6 seconds — ABNORMAL HIGH (ref 11.4–15.2)

## 2021-01-11 LAB — CORTISOL-AM, BLOOD: Cortisol - AM: 37.8 ug/dL — ABNORMAL HIGH (ref 6.7–22.6)

## 2021-01-11 LAB — PROCALCITONIN: Procalcitonin: 3.45 ng/mL

## 2021-01-11 MED ORDER — ETOMIDATE 2 MG/ML IV SOLN
INTRAVENOUS | Status: AC
  Filled 2021-01-11: qty 10

## 2021-01-11 MED ORDER — NOREPINEPHRINE 16 MG/250ML-% IV SOLN
0.0000 ug/min | INTRAVENOUS | Status: DC
  Administered 2021-01-11: 2 ug/min via INTRAVENOUS
  Administered 2021-01-11: 80 ug/min via INTRAVENOUS
  Administered 2021-01-12: 61 ug/min via INTRAVENOUS
  Administered 2021-01-12: 80 ug/min via INTRAVENOUS
  Administered 2021-01-12: 69 ug/min via INTRAVENOUS
  Administered 2021-01-12: 80 ug/min via INTRAVENOUS
  Administered 2021-01-12: 70 ug/min via INTRAVENOUS
  Administered 2021-01-12: 80 ug/min via INTRAVENOUS
  Administered 2021-01-13: 69 ug/min via INTRAVENOUS
  Administered 2021-01-13: 70 ug/min via INTRAVENOUS
  Administered 2021-01-13 (×2): 76 ug/min via INTRAVENOUS
  Administered 2021-01-13: 72 ug/min via INTRAVENOUS
  Filled 2021-01-11 (×13): qty 250

## 2021-01-11 MED ORDER — SODIUM BICARBONATE 8.4 % IV SOLN
100.0000 meq | Freq: Once | INTRAVENOUS | Status: AC
  Administered 2021-01-11: 100 meq via INTRAVENOUS
  Filled 2021-01-11: qty 100

## 2021-01-11 MED ORDER — FENTANYL BOLUS VIA INFUSION
25.0000 ug | INTRAVENOUS | Status: DC | PRN
  Filled 2021-01-11: qty 100

## 2021-01-11 MED ORDER — INSULIN ASPART 100 UNIT/ML IJ SOLN
0.0000 [IU] | INTRAMUSCULAR | Status: DC
  Administered 2021-01-11 – 2021-01-12 (×4): 2 [IU] via SUBCUTANEOUS

## 2021-01-11 MED ORDER — FENTANYL CITRATE (PF) 100 MCG/2ML IJ SOLN
INTRAMUSCULAR | Status: AC
  Filled 2021-01-11: qty 4

## 2021-01-11 MED ORDER — MIDAZOLAM 50MG/50ML (1MG/ML) PREMIX INFUSION
0.5000 mg/h | INTRAVENOUS | Status: DC
  Administered 2021-01-11: 0.5 mg/h via INTRAVENOUS
  Administered 2021-01-12: 2.5 mg/h via INTRAVENOUS
  Administered 2021-01-13: 10 mg/h via INTRAVENOUS
  Administered 2021-01-13: 6 mg/h via INTRAVENOUS
  Administered 2021-01-13: 10 mg/h via INTRAVENOUS
  Filled 2021-01-11 (×5): qty 50

## 2021-01-11 MED ORDER — VECURONIUM BOLUS VIA INFUSION
0.0800 mg/kg | Freq: Once | INTRAVENOUS | Status: AC
  Administered 2021-01-11: 7.6 mg via INTRAVENOUS
  Filled 2021-01-11: qty 8

## 2021-01-11 MED ORDER — STERILE WATER FOR INJECTION IV SOLN
INTRAVENOUS | Status: DC
  Filled 2021-01-11 (×4): qty 150
  Filled 2021-01-11: qty 1000
  Filled 2021-01-11: qty 150
  Filled 2021-01-11: qty 1000
  Filled 2021-01-11: qty 150
  Filled 2021-01-11: qty 1000

## 2021-01-11 MED ORDER — FENTANYL CITRATE (PF) 100 MCG/2ML IJ SOLN
25.0000 ug | Freq: Once | INTRAMUSCULAR | Status: DC
Start: 2021-01-11 — End: 2021-01-13

## 2021-01-11 MED ORDER — DOCUSATE SODIUM 50 MG/5ML PO LIQD
100.0000 mg | Freq: Two times a day (BID) | ORAL | Status: DC
  Filled 2021-01-11 (×2): qty 10

## 2021-01-11 MED ORDER — MIDAZOLAM HCL 2 MG/2ML IJ SOLN
INTRAMUSCULAR | Status: AC
  Filled 2021-01-11: qty 4

## 2021-01-11 MED ORDER — POLYETHYLENE GLYCOL 3350 17 G PO PACK
17.0000 g | PACK | Freq: Every day | ORAL | Status: DC
  Administered 2021-01-12 – 2021-01-13 (×2): 17 g
  Filled 2021-01-11 (×2): qty 1

## 2021-01-11 MED ORDER — ROCURONIUM BROMIDE 10 MG/ML (PF) SYRINGE
PREFILLED_SYRINGE | INTRAVENOUS | Status: AC
  Filled 2021-01-11: qty 10

## 2021-01-11 MED ORDER — CISATRACURIUM BESYLATE (PF) 10 MG/5ML IV SOLN
0.1000 mg/kg | INTRAVENOUS | Status: DC | PRN
  Filled 2021-01-11: qty 4.8

## 2021-01-11 MED ORDER — CISATRACURIUM BESYLATE 20 MG/10ML IV SOLN
0.1000 mg/kg | INTRAVENOUS | Status: DC | PRN
  Administered 2021-01-13: 9.6 mg via INTRAVENOUS
  Filled 2021-01-11: qty 10

## 2021-01-11 MED ORDER — FUROSEMIDE 10 MG/ML IJ SOLN
120.0000 mg | Freq: Once | INTRAVENOUS | Status: AC
  Administered 2021-01-11: 120 mg via INTRAVENOUS
  Filled 2021-01-11: qty 10

## 2021-01-11 MED ORDER — FENTANYL BOLUS VIA INFUSION
25.0000 ug | INTRAVENOUS | Status: DC | PRN
  Administered 2021-01-11: 25 ug via INTRAVENOUS
  Filled 2021-01-11: qty 25

## 2021-01-11 MED ORDER — PROPOFOL 1000 MG/100ML IV EMUL
25.0000 ug/kg/min | INTRAVENOUS | Status: DC
  Administered 2021-01-11: 25 ug/kg/min via INTRAVENOUS
  Filled 2021-01-11: qty 100

## 2021-01-11 MED ORDER — VASOPRESSIN 20 UNITS/100 ML INFUSION FOR SHOCK
0.0000 [IU]/min | INTRAVENOUS | Status: DC
  Administered 2021-01-11 – 2021-01-13 (×5): 0.03 [IU]/min via INTRAVENOUS
  Filled 2021-01-11 (×8): qty 100

## 2021-01-11 MED ORDER — VECURONIUM BROMIDE 10 MG IV SOLR
0.0000 ug/kg/min | Status: DC
  Administered 2021-01-11: 0.1 ug/kg/min via INTRAVENOUS
  Filled 2021-01-11: qty 100

## 2021-01-11 MED ORDER — FUROSEMIDE 10 MG/ML IJ SOLN
10.0000 mg/h | INTRAVENOUS | Status: DC
  Administered 2021-01-11: 8 mg/h via INTRAVENOUS
  Filled 2021-01-11 (×3): qty 20

## 2021-01-11 MED ORDER — FUROSEMIDE 10 MG/ML IJ SOLN
40.0000 mg | Freq: Once | INTRAMUSCULAR | Status: AC
  Administered 2021-01-11: 40 mg via INTRAVENOUS
  Filled 2021-01-11: qty 4

## 2021-01-11 MED ORDER — IPRATROPIUM-ALBUTEROL 0.5-2.5 (3) MG/3ML IN SOLN: 3.0000 mL | RESPIRATORY_TRACT | Status: DC | PRN

## 2021-01-11 MED ORDER — IPRATROPIUM-ALBUTEROL 0.5-2.5 (3) MG/3ML IN SOLN
3.0000 mL | Freq: Four times a day (QID) | RESPIRATORY_TRACT | Status: DC
  Administered 2021-01-11 – 2021-01-13 (×9): 3 mL via RESPIRATORY_TRACT
  Filled 2021-01-11 (×9): qty 3

## 2021-01-11 MED ORDER — HYDRALAZINE HCL 20 MG/ML IJ SOLN: 10.0000 mg | INTRAMUSCULAR | Status: DC | PRN

## 2021-01-11 MED ORDER — ARTIFICIAL TEARS OPHTHALMIC OINT
1.0000 "application " | TOPICAL_OINTMENT | Freq: Three times a day (TID) | OPHTHALMIC | Status: DC
  Administered 2021-01-11 – 2021-01-13 (×5): 1 via OPHTHALMIC
  Filled 2021-01-11: qty 3.5

## 2021-01-11 MED ORDER — FENTANYL CITRATE (PF) 100 MCG/2ML IJ SOLN: 25.0000 ug | Freq: Once | INTRAMUSCULAR | Status: DC

## 2021-01-11 MED ORDER — DEXMEDETOMIDINE HCL IN NACL 200 MCG/50ML IV SOLN
0.0000 ug/kg/h | INTRAVENOUS | Status: DC
  Administered 2021-01-11: 0.4 ug/kg/h via INTRAVENOUS
  Filled 2021-01-11: qty 50

## 2021-01-11 MED ORDER — FENTANYL 2500MCG IN NS 250ML (10MCG/ML) PREMIX INFUSION
25.0000 ug/h | INTRAVENOUS | Status: DC
  Administered 2021-01-11: 25 ug/h via INTRAVENOUS
  Administered 2021-01-13: 50 ug/h via INTRAVENOUS
  Administered 2021-01-13: 200 ug/h via INTRAVENOUS
  Filled 2021-01-11 (×3): qty 250

## 2021-01-11 MED ORDER — FUROSEMIDE 10 MG/ML IJ SOLN
INTRAMUSCULAR | Status: AC
  Filled 2021-01-11: qty 12

## 2021-01-11 MED ORDER — FENTANYL 2500MCG IN NS 250ML (10MCG/ML) PREMIX INFUSION
25.0000 ug/h | INTRAVENOUS | Status: DC
  Administered 2021-01-11: 25 ug/h via INTRAVENOUS
  Filled 2021-01-11: qty 250

## 2021-01-11 MED ORDER — PANTOPRAZOLE SODIUM 40 MG IV SOLR
40.0000 mg | Freq: Every day | INTRAVENOUS | Status: DC
  Administered 2021-01-11: 40 mg via INTRAVENOUS
  Filled 2021-01-11: qty 40

## 2021-01-11 NOTE — Progress Notes (Signed)
Pt having respiratory distress. HR 120, BP 126/114, RR 24. Expiratory wheezes heard on auscultation. MD notified. Ordered Lasix STAT and breathing treatments. RT notified. Will administer medications as ordered. Will continue to monitor for changes.

## 2021-01-11 NOTE — Progress Notes (Signed)
Called to bedside for rapid response. When arrived in room, patient on bipap with labored breathing (RR 50), patient pulling at bipap mask and unable to follow commands. HR ST 150's. Respiratory Therapy and Dr. Reesa Chew at bedside, requested CCM to bedside for possible intubation. Patient intubated (10mg   etomidate, 80 mg rocuronium, 100 mcg fentanyl IV administered per verbal MD order). Transferred patient to [facility] ICU.

## 2021-01-11 NOTE — Progress Notes (Signed)
Not making urine with lasix. Coox 80% Looking to be more c/w aspiration and ARDS than pulmonary edema but will continue lasix drip for now.  Erskine Emery MD PCCM

## 2021-01-11 NOTE — Progress Notes (Signed)
Patient prone at 36:62 with no complications.

## 2021-01-11 NOTE — Progress Notes (Signed)
Unfortunately cannot get sats > 80% despite multiple vent modes, aggressive PEEP, recruitment maneuvers.  Going to try proning.  Told family to consider code status as patient has refused defibrillator in past I am not sure he would want chest compressions or shocks.  Wife and son are considering.  Not sure he will live through night.  Erskine Emery MD PCCM

## 2021-01-11 NOTE — Procedures (Signed)
Central Venous Catheter Insertion Procedure Note  TRAVERS GOODLEY  [id number]  [birth year]  Date:01/11/21  Time:3:12 PM   Provider Performing:Tamiya Colello D. Harris   Procedure: Insertion of Non-tunneled Central Venous [id number]) with US guidance (50757)   Indication(s) Medication administration and Difficult access  Consent Risks of the procedure as well as the alternatives and risks of each were explained to the patient and/or caregiver.  Consent for the procedure was obtained and is signed in the bedside chart  Anesthesia Topical only with 1% lidocaine   Timeout Verified patient identification, verified procedure, site/side was marked, verified correct patient position, special equipment/implants available, medications/allergies/relevant history reviewed, required imaging and test results available.  Sterile Technique Maximal sterile technique including full sterile barrier drape, hand hygiene, sterile gown, sterile gloves, mask, hair covering, sterile ultrasound probe cover (if used).  Procedure Description Area of catheter insertion was cleaned with chlorhexidine and draped in sterile fashion.  With real-time ultrasound guidance a central venous catheter was placed into the left internal jugular vein. Nonpulsatile blood flow and easy flushing noted in all ports.  The catheter was sutured in place and sterile dressing applied.  Complications/Tolerance None; patient tolerated the procedure well. Chest X-ray is ordered to verify placement for internal jugular or subclavian cannulation.   Chest x-ray is not ordered for femoral cannulation.  EBL Minimal  Specimen(s) None   Zacaria Pousson D. Kenton Kingfisher, NP-C [facility] Personal contact information can be found on Amion  01/11/2021, 3:13 PM

## 2021-01-11 NOTE — Procedures (Signed)
Intubation Procedure Note  Michael Randolph  [id number]  08/02/1934  Date:01/11/21  Time:3:12 PM   Provider Performing:Ethyl Vila C Tamala Julian    Procedure: Intubation (99242)  Indication(s) Respiratory Failure  Consent Unable to obtain consent due to emergent nature of procedure.   Anesthesia Etomidate and Rocuronium   Time Out Verified patient identification, verified procedure, site/side was marked, verified correct patient position, special equipment/implants available, medications/allergies/relevant history reviewed, required imaging and test results available.   Sterile Technique Usual hand hygeine, masks, and gloves were used   Procedure Description Patient positioned in bed supine.  Sedation given as noted above.  Patient was intubated with endotracheal tube using Glidescope.  View was Grade 1 full glottis .  Number of attempts was  2 due to copious bilious secretions obstructing glottis .  Colorimetric CO2 detector was consistent with tracheal placement.   Complications/Tolerance None; patient tolerated the procedure well. Chest X-ray is ordered to verify placement.   EBL Minimal   Specimen(s) None

## 2021-01-11 NOTE — Progress Notes (Signed)
PROGRESS NOTE    Michael Randolph  TKW:[id number] DOB: 03/19/1935 DOA: 12/26/2020 PCP: Shon Baton, MD   Brief Narrative:  86 year old with history of DM2, CKD stage III, combined CHF, DVT, urinary retention with straight caths at home presents with fever and dysuria.  Found to have sepsis secondary to UTI.   Assessment & Plan:   Principal Problem:   Sepsis secondary to UTI [facility]) Active Problems:   Chronic combined systolic and diastolic CHF (congestive heart failure) (HCC)   NICM (nonischemic cardiomyopathy) ([place])   History of DVT (deep vein thrombosis)   Hypothyroidism   CKD stage 3 due to type 2 diabetes mellitus (HCC)        Severe sepsis secondary to UTI (POA) Chronic urinary retention status post straight cath at home -Evidenced by fever, leukocytosis and source.  Lactic acidosis improving - IV cefepime.  Monitor culture data.  Developing crackles therefore fluids stopped      Chronic combined systolic and diastolic dysfunction CHF Last known LVEF of 25 to 30% with LV global hypokinesis -Home diuretics and losartan on hold due to soft blood pressure.  We will slowly resume   Hypothyroidism -Continue Synthroid    History of DVT -continue Eliquis   CKD stage IIIa -Baseline creatinine 1.5.  Diabetes mellitus type 2 - Sliding scale and Accu-Cheks  DVT prophylaxis: Eliquis Code Status: Full code Family Communication:  Called Mrs Heist - NO answer  Status is: Inpatient  Not inpatient appropriate, will call UM team and downgrade to OBS.   Dispo: The patient is from: Home              Anticipated d/c is to: Home              Patient currently is not medically stable to d/c.   Difficult to place patient No     Subjective: Feels better, still feels weak overall.  Wants to eat something.  Review of Systems Otherwise negative except as per HPI, including: General: Denies fever, chills, night sweats or unintended weight loss. Resp: Denies cough,  wheezing, shortness of breath. Cardiac: Denies chest pain, palpitations, orthopnea, paroxysmal nocturnal dyspnea. GI: Denies abdominal pain, nausea, vomiting, diarrhea or constipation GU: Denies dysuria, frequency, hesitancy or incontinence MS: Denies muscle aches, joint pain or swelling Neuro: Denies headache, neurologic deficits (focal weakness, numbness, tingling), abnormal gait Psych: Denies anxiety, depression, SI/HI/AVH Skin: Denies new rashes or lesions ID: Denies sick contacts, exotic exposures, travel  Examination:  General exam: Appears calm and comfortable  Respiratory system: Mild bibasilar crackles Cardiovascular system: S1 & S2 heard, RRR. No JVD, murmurs, rubs, gallops or clicks. No pedal edema. Gastrointestinal system: Abdomen is nondistended, soft and nontender. No organomegaly or masses felt. Normal bowel sounds heard. Central nervous system: Alert and oriented. No focal neurological deficits. Extremities: Symmetric 5 x 5 power. Skin: No rashes, lesions or ulcers Psychiatry: Judgement and insight appear normal. Mood & affect appropriate.     Objective: Vitals:   01/11/21 0206 01/11/21 0211 01/11/21 0222 01/11/21 0509  BP: (!) 191/110 116/66  104/65  Pulse: (!) 107 (!) 101 (!) 103 85  Resp: 19 (!) 25  19  Temp: 99.1 F (37.3 C)   97.6 F (36.4 C)  TempSrc:      SpO2: (!) 87%  92% 95%  Weight:      Height:        Intake/Output Summary (Last 24 hours) at 01/11/2021 0827 Last data filed at 01/11/2021 0519 Gross per 24 hour  Intake 2075 ml  Output 3350 ml  Net -1275 ml   Filed Weights   01/04/2021 1000 12/20/2020 1700  Weight: 90.6 kg 95.6 kg     Data Reviewed:   CBC: Recent Labs  Lab 01/06/2021 1059 01/11/21 0324  WBC 21.1* 19.4*  NEUTROABS 19.3*  --   HGB 14.5 13.4  HCT 44.4 41.1  MCV 84.6 84.0  PLT 227 161   Basic Metabolic Panel: Recent Labs  Lab 12/14/2020 1059 01/11/21 0324  NA 134* 134*  K 4.2 4.5  CL 100 102  CO2 21* 24  GLUCOSE 282*  172*  BUN 24* 23  CREATININE 1.58* 1.56*  CALCIUM 9.4 8.5*   GFR: Estimated Creatinine Clearance: 38.8 mL/min (A) (by C-G formula based on SCr of 1.56 mg/dL (H)). Liver Function Tests: Recent Labs  Lab 01/08/2021 1059  AST 28  ALT 16  ALKPHOS 61  BILITOT 1.3*  PROT 7.5  ALBUMIN 4.0   No results for input(s): LIPASE, AMYLASE in the last 168 hours. No results for input(s): AMMONIA in the last 168 hours. Coagulation Profile: Recent Labs  Lab 01/09/2021 1059 01/11/21 0324  INR 1.3* 1.8*   Cardiac Enzymes: No results for input(s): CKTOTAL, CKMB, CKMBINDEX, TROPONINI in the last 168 hours. BNP (last 3 results) No results for input(s): PROBNP in the last 8760 hours. HbA1C: No results for input(s): HGBA1C in the last 72 hours. CBG: Recent Labs  Lab 12/22/2020 1713 12/17/2020 2152 01/11/21 0825  GLUCAP 150* 187* 147*   Lipid Profile: No results for input(s): CHOL, HDL, LDLCALC, TRIG, CHOLHDL, LDLDIRECT in the last 72 hours. Thyroid Function Tests: No results for input(s): TSH, T4TOTAL, FREET4, T3FREE, THYROIDAB in the last 72 hours. Anemia Panel: No results for input(s): VITAMINB12, FOLATE, FERRITIN, TIBC, IRON, RETICCTPCT in the last 72 hours. Sepsis Labs: Recent Labs  Lab 01/08/2021 1059 12/21/2020 1210 12/19/2020 1902 01/11/21 0324  PROCALCITON  --   --   --  3.45  LATICACIDVEN 5.5* 4.2* 3.3*  --     Recent Results (from the past 240 hour(s))  Blood culture (routine single)     Status: None (Preliminary result)   Collection Time: 12/16/2020 10:54 AM   Specimen: BLOOD  Result Value Ref Range Status   Specimen Description   Final    BLOOD LEFT WRIST Performed at [facility] [address], [place], [place] [address]    Special Requests   Final    BOTTLES DRAWN AEROBIC AND ANAEROBIC Blood Culture adequate volume Performed at [facility] [address], [place], [place] [address]    Culture   Final    NO GROWTH < 24  HOURS Performed at [facility] [address], [place], [place] [address]    Report Status PENDING  Incomplete  Resp Panel by RT-PCR (Flu A&B, Covid) Nasopharyngeal Swab     Status: None   Collection Time: 12/28/2020 10:59 AM   Specimen: Nasopharyngeal Swab; Nasopharyngeal(NP) swabs in vial transport medium  Result Value Ref Range Status   SARS Coronavirus 2 by RT PCR NEGATIVE NEGATIVE Final    Comment: (NOTE) SARS-CoV-2 target nucleic acids are NOT DETECTED.  The SARS-CoV-2 RNA is generally detectable in upper respiratory specimens during the acute phase of infection. The lowest concentration of SARS-CoV-2 viral copies this assay can detect is 138 copies/mL. A negative result does not preclude SARS-Cov-2 infection and should not be used as the sole basis for treatment or other patient management decisions. A negative  result may occur with  improper specimen collection/handling, submission of specimen other than nasopharyngeal swab, presence of viral mutation(s) within the areas targeted by this assay, and inadequate number of viral copies(<138 copies/mL). A negative result must be combined with clinical observations, patient history, and epidemiological information. The expected result is Negative.  Fact Sheet for Patients:  EntrepreneurPulse.com.au  Fact Sheet for Healthcare Providers:  IncredibleEmployment.be  This test is no t yet approved or cleared by the [place] FDA and  has been authorized for detection and/or diagnosis of SARS-CoV-2 by FDA under an Emergency Use Authorization (EUA). This EUA will remain  in effect (meaning this test can be used) for the duration of the COVID-19 declaration under Section 564(b)(1) of the Act, 21 U.S.C.section 360bbb-3(b)(1), unless the authorization is terminated  or revoked sooner.       Influenza A by PCR NEGATIVE NEGATIVE Final   Influenza B by PCR NEGATIVE NEGATIVE Final    Comment:  (NOTE) The Xpert Xpress SARS-CoV-2/FLU/RSV plus assay is intended as an aid in the diagnosis of influenza from Nasopharyngeal swab specimens and should not be used as a sole basis for treatment. Nasal washings and aspirates are unacceptable for Xpert Xpress SARS-CoV-2/FLU/RSV testing.  Fact Sheet for Patients: EntrepreneurPulse.com.au  Fact Sheet for Healthcare Providers: IncredibleEmployment.be  This test is not yet approved or cleared by the [place] FDA and has been authorized for detection and/or diagnosis of SARS-CoV-2 by FDA under an Emergency Use Authorization (EUA). This EUA will remain in effect (meaning this test can be used) for the duration of the COVID-19 declaration under Section 564(b)(1) of the Act, 21 U.S.C. section 360bbb-3(b)(1), unless the authorization is terminated or revoked.  Performed at [facility], [place] [address] [place], [place] [address]          Radiology Studies: [facility] Chest Port 1 View  Result Date: 01/08/2021 CLINICAL DATA:  Questionable sepsis. EXAM: PORTABLE CHEST 1 VIEW COMPARISON:  10/12/2020. FINDINGS: Mediastinum hilar structures normal. Heart size stable. No pulmonary venous congestion. Low lung volumes. Mild bibasilar atelectasis. No pleural effusion or pneumothorax. Postsurgical changes right shoulder. IMPRESSION: Lung volumes with mild bibasilar atelectasis. Electronically Signed   By: Marcello Moores  Register   On: 12/13/2020 11:29        Scheduled Meds:  acidophilus  1 capsule Oral Daily   apixaban  2.5 mg Oral BID   Chlorhexidine Gluconate Cloth  6 each Topical Daily   cholecalciferol  2,000 Units Oral BID   finasteride  5 mg Oral Daily   insulin aspart  0-15 Units Subcutaneous TID WC   levothyroxine  75 mcg Oral Daily   Continuous Infusions:  ceFEPime (MAXIPIME) IV 2 g (01/11/21 7672)   lactated ringers 125 mL/hr at 01/11/21 0017     LOS: 1 day   Time spent= 35  mins    Skyra Crichlow Arsenio Loader, MD [facility]  If 7PM-7AM, please contact night-coverage  01/11/2021, 8:27 AM

## 2021-01-11 NOTE — Consult Note (Signed)
NAME:  Michael Randolph, MRN:  [id number], DOB:  [birth year], LOS: 1 ADMISSION DATE:  12/16/2020, CONSULTATION DATE:  01/11/21 REFERRING MD:  Reesa Chew, CHIEF COMPLAINT:  SOB   History of Present Illness:  86 year old man with hx of DM, CKD, DVT, urinary retention who presented with s/s of urosepsis, given fluids and decompensated.  Arrived to room to find him on 100% FiO2 BIPAP breathing in 50s with sats in 60s.  History per chart review after intubation.  Pertinent  Medical History  CHF, EF 25-30% Chronic urinary retention self-caths CKD3a DVT on AC OSA TIA Stroke DM2 a1c 6%   Significant Hospital Events: Including procedures, antibiotic start and stop dates in addition to other pertinent events   6/30 admitted 7/1 respiratory failure fulminant  Interim History / Subjective:  consulted  Objective   Blood pressure (!) 126/114, pulse (!) 132, temperature 99.1 F (37.3 C), temperature source Oral, resp. rate (!) 28, height 5\' 7"  (1.702 m), weight 95.6 kg, SpO2 (!) 67 %.    Vent Mode: PCV FiO2 (%):  [100 %] 100 % Set Rate:  [18 bmp-28 bmp] 18 bmp Vt Set:  [520 mL-540 mL] 520 mL PEEP:  [12 cmH20] 12 cmH20 Plateau Pressure:  [33 cmH20] 33 cmH20   Intake/Output Summary (Last 24 hours) at 01/11/2021 1440 Last data filed at 01/11/2021 1300 Gross per 24 hour  Intake 75 ml  Output 3650 ml  Net -3575 ml   Filed Weights   12/14/2020 1000 12/31/2020 1700  Weight: 90.6 kg 95.6 kg    Examination: General: ill appearing man cyanotic HENT: BIPAP in place with good seal Lungs: crackles bilaterally, + accessory muscle use and tripoding Cardiovascular: Tachycardic, regular, ext warm Abdomen: soft, hypoactive BS Extremities: 1+ edema Neuro: writhing all 4 ext GU: foley in place with dark urine  Cr fine Pct up mildly WBC up UA positive, culture pending  Resolved Hospital Problem list   N/a  Assessment & Plan:  Acute severe hypoxemic respiratory failure- in setting of AMS, urosepsis,  baseline biventricular heart failure and fluid rescuscitation.  Leading differential would be pulmonary edema but aspiration/ARDS also a possibility.  CKD3- Cr at baseline  Probable UTI with sepsis  - Intubate, VAP prevention bundle, generous PEEP and ARDSnet lung protecting TV - Consider proning if P/F < 100 - Cefepime should cover most organisms - Lasix push and drip with standing BMP q8h - Check Coox, low threshold for inotropes - If stable would benefit from transfer to [facility] for nebulized vasodilators and potentially CHF consult - Not an ECMO candidate given comorbidities - Guarded prognosis  Best Practice (right click and "Reselect all SmartList Selections" daily)   Diet/type: NPO DVT prophylaxis: systemic heparin GI prophylaxis: PPI Lines: Central line Foley:  Yes, and it is still needed Code Status:  full code Last date of multidisciplinary goals of care discussion [7/1 full scope]  Labs   CBC: Recent Labs  Lab 12/17/2020 1059 01/11/21 0324  WBC 21.1* 19.4*  NEUTROABS 19.3*  --   HGB 14.5 13.4  HCT 44.4 41.1  MCV 84.6 84.0  PLT 227 938    Basic Metabolic Panel: Recent Labs  Lab 12/27/2020 1059 01/11/21 0324  NA 134* 134*  K 4.2 4.5  CL 100 102  CO2 21* 24  GLUCOSE 282* 172*  BUN 24* 23  CREATININE 1.58* 1.56*  CALCIUM 9.4 8.5*   GFR: Estimated Creatinine Clearance: 38.8 mL/min (A) (by C-G formula based on SCr of 1.56 mg/dL (H)).  Recent Labs  Lab 12/18/2020 1059 12/15/2020 1210 01/01/2021 1902 01/11/21 0324  PROCALCITON  --   --   --  3.45  WBC 21.1*  --   --  19.4*  LATICACIDVEN 5.5* 4.2* 3.3*  --     Liver Function Tests: Recent Labs  Lab 12/19/2020 1059  AST 28  ALT 16  ALKPHOS 61  BILITOT 1.3*  PROT 7.5  ALBUMIN 4.0   No results for input(s): LIPASE, AMYLASE in the last 168 hours. No results for input(s): AMMONIA in the last 168 hours.  ABG    Component Value Date/Time   PHART 7.441 12/01/2014 1631   PCO2ART 35.1 12/01/2014 1631    PO2ART 68.0 (L) 12/01/2014 1631   HCO3 23.9 12/01/2014 1631   TCO2 25 12/01/2014 1631   O2SAT 65.3 08/05/2017 0450     Coagulation Profile: Recent Labs  Lab 12/25/2020 1059 01/11/21 0324  INR 1.3* 1.8*    Cardiac Enzymes: No results for input(s): CKTOTAL, CKMB, CKMBINDEX, TROPONINI in the last 168 hours.  HbA1C: Hgb A1c MFr Bld  Date/Time Value Ref Range Status  10/13/2020 12:02 AM 6.0 (H) 4.8 - 5.6 % Final    Comment:    (NOTE) Pre diabetes:          5.7%-6.4%  Diabetes:              >6.4%  Glycemic control for   <7.0% adults with diabetes   08/10/2017 03:31 AM 6.2 (H) 4.8 - 5.6 % Final    Comment:    (NOTE) Pre diabetes:          5.7%-6.4% Diabetes:              >6.4% Glycemic control for   <7.0% adults with diabetes     CBG: Recent Labs  Lab 12/26/2020 1713 01/08/2021 2152 01/11/21 0825 01/11/21 1104  GLUCAP 150* 187* 147* 129*    Review of Systems:   Cannot assess, in extremis  Past Medical History:  He,  has a past medical history of Congestive heart failure (CHF) ([place]), Diabetes mellitus without complication ([place]), Diverticulosis, DVT (deep venous thrombosis) ([place]), Heart disease, Hyperlipemia, Kidney stone, NICM (nonischemic cardiomyopathy) ([place]), OSA (obstructive sleep apnea), Stroke ([place]), TIA (transient ischemic attack) (2008), Toe infection, and UTI (urinary tract infection) (10/12/2020).   Surgical History:   Past Surgical History:  Procedure Laterality Date   CARDIAC CATHETERIZATION N/A 12/01/2014   Procedure: Right/Left Heart Cath and Coronary Angiography;  Surgeon: Jettie Booze, MD;  Location: [facility];  Service: Cardiovascular;  Laterality: N/A;   COLONOSCOPY  2013   ROTATOR CUFF REPAIR Right      Social History:   reports that he has never smoked. He has never used smokeless tobacco. He reports that he does not drink alcohol and does not use drugs.   Family History:  His family history includes Crohn's disease in his  brother; Deep vein thrombosis in his father; Heart attack in his father; Hypertension in his mother; Schizophrenia in his sister; Stroke in his mother.   Allergies Allergies  Allergen Reactions   Actos [Pioglitazone] Other (See Comments)    Slow HR and pain in face    Aloe Other (See Comments)    other   Anesthetics, Halogenated Other (See Comments)    Slept too long   Crestor [Rosuvastatin Calcium]     Pain    Sulfa Antibiotics Other (See Comments)    Hallucinations    Dye Fdc Red [Red Dye]  Rash    # 40     Home Medications  Prior to Admission medications   Medication Sig Start Date End Date Taking? Authorizing Provider  apixaban (ELIQUIS) 2.5 MG TABS tablet Take 2.5 mg by mouth 2 (two) times daily.   Yes [provider]  Cholecalciferol 2000 UNITS CAPS Take 2,000 Units by mouth 2 (two) times daily.    Yes [provider]  eplerenone (INSPRA) 25 MG tablet Take 0.5 tablets (12.5 mg total) by mouth daily. 10/26/20  Yes Bensimhon, Shaune Pascal, MD  finasteride (PROSCAR) 5 MG tablet Take 5 mg by mouth daily.   Yes [provider]  GARLIC PO Take 1 tablet by mouth daily.   Yes [provider]  levothyroxine (SYNTHROID) 75 MCG tablet Take 75 mcg by mouth daily. 09/05/20  Yes [provider]  losartan (COZAAR) 25 MG tablet Take 1 tablet daily Patient taking differently: Take 12.5 mg by mouth daily. Take 1 tablet daily 10/24/20  Yes Bensimhon, Shaune Pascal, MD  MAGNESIUM PO Take 1 tablet by mouth 2 (two) times daily.    Yes [provider]  Methylsulfonylmethane (MSM) 1000 MG CAPS Take 1,000 mg by mouth daily.    Yes [provider]  nitrofurantoin, macrocrystal-monohydrate, (MACROBID) 100 MG capsule Take 100 mg by mouth 2 (two) times daily. Start date : 01/09/21 01/09/21  Yes [provider]  Probiotic Product (PROBIOTIC-10 PO) Take 1 tablet by mouth daily.   Yes [provider]  sodium chloride (OCEAN) 0.65 % SOLN  nasal spray Place 1 spray into both nostrils daily as needed for congestion.   Yes [provider]  torsemide (DEMADEX) 20 MG tablet Take 1 tablet (20 mg total) by mouth daily. 12/06/20  Yes Bensimhon, Shaune Pascal, MD     Critical care time: 45 minutes not including any separately billable procedures

## 2021-01-11 NOTE — Progress Notes (Addendum)
eLink Physician-Brief Progress Note Patient Name: Michael Randolph DOB: 1934-08-23 MRN: [id number]   Date of Service  01/11/2021  HPI/Events of Note  ABG on 100% BiLevel Phigh 38 and Plow  12 = 7.199/31.5/64.5/15.5. Patient currently on AC moderate Novolog SSI  eICU Interventions  Plan: Continue current ventilator orders. NaHCO3 100 meq IV now. NaHCO3 IV infusion to run at 75 mL/hour. Repeat ABG at 1 AM. Change to Q 4 hour moderate Novolog SSI.     Intervention Category Major Interventions: Respiratory failure - evaluation and management;Acid-Base disturbance - evaluation and management  Zaria Taha Eugene 01/11/2021, 9:11 PM

## 2021-01-11 NOTE — Progress Notes (Signed)
I spent time with Michael Randolph and with Michael Randolph.  They are still in shock over how suddenly Michael condition changed.  I provided listening presence and prayer at their request.  Chaplain Janne Napoleon, Bcc Pager, [phone] 5:11 PM

## 2021-01-11 NOTE — Procedures (Signed)
Arterial Catheter Insertion Procedure Note  TREBOR GALDAMEZ  [id number]  [birth year]  Date:01/11/21  Time:3:13 PM    Provider Performing: Loree Fee D. Harris    Procedure: Insertion of Arterial Line [phone]) with US guidance (92010)   Indication(s) Blood pressure monitoring and/or need for frequent ABGs  Consent Risks of the procedure as well as the alternatives and risks of each were explained to the patient and/or caregiver.  Consent for the procedure was obtained and is signed in the bedside chart  Anesthesia None   Time Out Verified patient identification, verified procedure, site/side was marked, verified correct patient position, special equipment/implants available, medications/allergies/relevant history reviewed, required imaging and test results available.   Sterile Technique Maximal sterile technique including full sterile barrier drape, hand hygiene, sterile gown, sterile gloves, mask, hair covering, sterile ultrasound probe cover (if used).   Procedure Description Area of catheter insertion was cleaned with chlorhexidine and draped in sterile fashion. With real-time ultrasound guidance an arterial catheter was placed into the right radial artery.  Appropriate arterial tracings confirmed on monitor.     Complications/Tolerance None; patient tolerated the procedure well.   EBL Minimal   Specimen(s) None   Real Cona D. Kenton Kingfisher, NP-C [facility] Personal contact information can be found on Amion  01/11/2021, 3:14 PM

## 2021-01-11 NOTE — Progress Notes (Signed)
Early in the afternoon patient had progression of worsening respiratory symptoms.  Saturating in 70s with tachypnea despite of being on 6 L of nasal cannula.  I reevaluated him and noted his respiratory sounds had progressively worsened.  Lasix 40 mg IV was given and he urinated about 300 cc.  He was also given breathing treatment placed on nonrebreather.  His symptoms continue to progress and was tachypneic into the 40s-50s even while on BiPAP. ICU team was notified and patient was immediately intubated.  I suspect fluid overload secondary to fluid boluses received during admission due to sepsis protocol.  Chest x-ray has been ordered.  Patient will be transferred to the ICU for further care.  TRH will resume care once patient is extubated and out of the ICU.  Rest of the plan as mentioned in previous progress note.  Appreciate care from the nursing staff, RT and ICU team.  Wife at bedside.   Gerlean Ren MD [facility]

## 2021-01-11 NOTE — Progress Notes (Signed)
Lasix and DuoNeb administered. Pt placed on BiPaP by RT. Pt still experiencing SOB with highest SpO2 at 85%. MD notified to come to bedside. Rapid Response Nurse notified. ICU MD notified. STAT CXR ordered. Advised by ICU MD to intubate at bedside. Pt intubated by Rapid Response team and placed on vent. Pt transferred to ICU. Family notified.

## 2021-01-11 DEATH — deceased

## 2021-01-12 ENCOUNTER — Inpatient Hospital Stay (HOSPITAL_COMMUNITY): Payer: Medicare Other

## 2021-01-12 DIAGNOSIS — I5042 Chronic combined systolic (congestive) and diastolic (congestive) heart failure: Secondary | ICD-10-CM

## 2021-01-12 DIAGNOSIS — N390 Urinary tract infection, site not specified: Secondary | ICD-10-CM | POA: Diagnosis not present

## 2021-01-12 DIAGNOSIS — E1122 Type 2 diabetes mellitus with diabetic chronic kidney disease: Secondary | ICD-10-CM | POA: Diagnosis not present

## 2021-01-12 DIAGNOSIS — J9601 Acute respiratory failure with hypoxia: Secondary | ICD-10-CM | POA: Diagnosis present

## 2021-01-12 DIAGNOSIS — A419 Sepsis, unspecified organism: Secondary | ICD-10-CM | POA: Diagnosis not present

## 2021-01-12 DIAGNOSIS — N183 Chronic kidney disease, stage 3 unspecified: Secondary | ICD-10-CM

## 2021-01-12 LAB — BLOOD GAS, ARTERIAL
Acid-base deficit: 14.4 mmol/L — ABNORMAL HIGH (ref 0.0–2.0)
Acid-base deficit: 11.2 mmol/L — ABNORMAL HIGH (ref 0.0–2.0)
Acid-base deficit: 18.7 mmol/L — ABNORMAL HIGH (ref 0.0–2.0)
Acid-base deficit: 11.8 mmol/L — ABNORMAL HIGH (ref 0.0–2.0)
Acid-base deficit: 6.6 mmol/L — ABNORMAL HIGH (ref 0.0–2.0)
Bicarbonate: 13.5 mmol/L — ABNORMAL LOW (ref 20.0–28.0)
Bicarbonate: 15.2 mmol/L — ABNORMAL LOW (ref 20.0–28.0)
Bicarbonate: 10.1 mmol/L — ABNORMAL LOW (ref 20.0–28.0)
Bicarbonate: 14.8 mmol/L — ABNORMAL LOW (ref 20.0–28.0)
Bicarbonate: 18 mmol/L — ABNORMAL LOW (ref 20.0–28.0)
Drawn by: 25770
Drawn by: 27021
FIO2: 100
FIO2: 100
FIO2: 100
FIO2: 90
FIO2: 80
MECHVT: 550 mL
MECHVT: 550 mL
O2 Saturation: 90 %
O2 Saturation: 95.3 %
O2 Saturation: 96.9 %
O2 Saturation: 97.4 %
O2 Saturation: 91.6 %
PEEP: 12 cmH2O
PEEP: 12 cmH2O
Patient temperature: 36.8
Patient temperature: 97.2
Patient temperature: 97.3
Patient temperature: 98.6
Patient temperature: 101
RATE: 24 resp/min
RATE: 24 resp/min
pCO2 arterial: 37.3 mmHg (ref 32.0–48.0)
pCO2 arterial: 35.5 mmHg (ref 32.0–48.0)
pCO2 arterial: 32 mmHg (ref 32.0–48.0)
pCO2 arterial: 36.3 mmHg (ref 32.0–48.0)
pCO2 arterial: 36.9 mmHg (ref 32.0–48.0)
pH, Arterial: 7.182 — CL (ref 7.350–7.450)
pH, Arterial: 7.251 — ABNORMAL LOW (ref 7.350–7.450)
pH, Arterial: 7.122 — CL (ref 7.350–7.450)
pH, Arterial: 7.234 — ABNORMAL LOW (ref 7.350–7.450)
pH, Arterial: 7.316 — ABNORMAL LOW (ref 7.350–7.450)
pO2, Arterial: 65.8 mmHg — ABNORMAL LOW (ref 83.0–108.0)
pO2, Arterial: 76.7 mmHg — ABNORMAL LOW (ref 83.0–108.0)
pO2, Arterial: 113 mmHg — ABNORMAL HIGH (ref 83.0–108.0)
pO2, Arterial: 106 mmHg (ref 83.0–108.0)
pO2, Arterial: 71.1 mmHg — ABNORMAL LOW (ref 83.0–108.0)

## 2021-01-12 LAB — BASIC METABOLIC PANEL
Anion gap: 20 — ABNORMAL HIGH (ref 5–15)
Anion gap: >20 — ABNORMAL HIGH (ref 5–15)
Anion gap: >20 — ABNORMAL HIGH (ref 5–15)
BUN: 36 mg/dL — ABNORMAL HIGH (ref 8–23)
BUN: 40 mg/dL — ABNORMAL HIGH (ref 8–23)
BUN: 43 mg/dL — ABNORMAL HIGH (ref 8–23)
CO2: 16 mmol/L — ABNORMAL LOW (ref 22–32)
CO2: 13 mmol/L — ABNORMAL LOW (ref 22–32)
CO2: 17 mmol/L — ABNORMAL LOW (ref 22–32)
Calcium: 7.8 mg/dL — ABNORMAL LOW (ref 8.9–10.3)
Calcium: 8.1 mg/dL — ABNORMAL LOW (ref 8.9–10.3)
Calcium: 7.6 mg/dL — ABNORMAL LOW (ref 8.9–10.3)
Chloride: 103 mmol/L (ref 98–111)
Chloride: 100 mmol/L (ref 98–111)
Chloride: 99 mmol/L (ref 98–111)
Creatinine, Ser: 2.9 mg/dL — ABNORMAL HIGH (ref 0.61–1.24)
Creatinine, Ser: 3.19 mg/dL — ABNORMAL HIGH (ref 0.61–1.24)
Creatinine, Ser: 3.47 mg/dL — ABNORMAL HIGH (ref 0.61–1.24)
GFR, Estimated: 21 mL/min — ABNORMAL LOW (ref >60)
GFR, Estimated: 18 mL/min — ABNORMAL LOW (ref >60)
GFR, Estimated: 17 mL/min — ABNORMAL LOW (ref >60)
Glucose, Bld: 136 mg/dL — ABNORMAL HIGH (ref 70–99)
Glucose, Bld: 142 mg/dL — ABNORMAL HIGH (ref 70–99)
Glucose, Bld: 144 mg/dL — ABNORMAL HIGH (ref 70–99)
Potassium: 3.1 mmol/L — ABNORMAL LOW (ref 3.5–5.1)
Potassium: 4 mmol/L (ref 3.5–5.1)
Potassium: 4 mmol/L (ref 3.5–5.1)
Sodium: 139 mmol/L (ref 135–145)
Sodium: 141 mmol/L (ref 135–145)
Sodium: 141 mmol/L (ref 135–145)

## 2021-01-12 LAB — PHOSPHORUS
Phosphorus: 6.8 mg/dL — ABNORMAL HIGH (ref 2.5–4.6)
Phosphorus: 7.1 mg/dL — ABNORMAL HIGH (ref 2.5–4.6)

## 2021-01-12 LAB — GLUCOSE, CAPILLARY
Glucose-Capillary: 101 mg/dL — ABNORMAL HIGH (ref 70–99)
Glucose-Capillary: 106 mg/dL — ABNORMAL HIGH (ref 70–99)
Glucose-Capillary: 113 mg/dL — ABNORMAL HIGH (ref 70–99)
Glucose-Capillary: 116 mg/dL — ABNORMAL HIGH (ref 70–99)
Glucose-Capillary: 121 mg/dL — ABNORMAL HIGH (ref 70–99)
Glucose-Capillary: 124 mg/dL — ABNORMAL HIGH (ref 70–99)
Glucose-Capillary: 125 mg/dL — ABNORMAL HIGH (ref 70–99)

## 2021-01-12 LAB — CBC
HCT: 52.2 % — ABNORMAL HIGH (ref 39.0–52.0)
Hemoglobin: 16.5 g/dL (ref 13.0–17.0)
MCH: 27.6 pg (ref 26.0–34.0)
MCHC: 31.6 g/dL (ref 30.0–36.0)
MCV: 87.3 fL (ref 80.0–100.0)
Platelets: 197 10*3/uL (ref 150–400)
RBC: 5.98 MIL/uL — ABNORMAL HIGH (ref 4.22–5.81)
RDW: 14 % (ref 11.5–15.5)
WBC: 22.4 10*3/uL — ABNORMAL HIGH (ref 4.0–10.5)
nRBC: 0 % (ref 0.0–0.2)

## 2021-01-12 LAB — MAGNESIUM: Magnesium: 1.9 mg/dL (ref 1.7–2.4)

## 2021-01-12 LAB — HEMOGLOBIN A1C
Hgb A1c MFr Bld: 6.3 % — ABNORMAL HIGH (ref 4.8–5.6)
Mean Plasma Glucose: 134 mg/dL

## 2021-01-12 LAB — TRIGLYCERIDES: Triglycerides: 150 mg/dL — ABNORMAL HIGH (ref <150)

## 2021-01-12 LAB — CORTISOL: Cortisol, Plasma: 53 ug/dL

## 2021-01-12 MED ORDER — CHLORHEXIDINE GLUCONATE 0.12% ORAL RINSE (MEDLINE KIT)
15.0000 mL | Freq: Two times a day (BID) | OROMUCOSAL | Status: DC
  Administered 2021-01-12 – 2021-01-13 (×2): 15 mL via OROMUCOSAL

## 2021-01-12 MED ORDER — SODIUM BICARBONATE 8.4 % IV SOLN
100.0000 meq | Freq: Once | INTRAVENOUS | Status: AC
  Administered 2021-01-12: 100 meq via INTRAVENOUS
  Filled 2021-01-12: qty 100

## 2021-01-12 MED ORDER — POTASSIUM CHLORIDE 10 MEQ/50ML IV SOLN
10.0000 meq | INTRAVENOUS | Status: AC
  Administered 2021-01-12 (×4): 10 meq via INTRAVENOUS
  Filled 2021-01-12 (×4): qty 50

## 2021-01-12 MED ORDER — SODIUM CHLORIDE 0.9 % IV SOLN
2.0000 g | INTRAVENOUS | Status: DC
  Administered 2021-01-13: 2 g via INTRAVENOUS
  Filled 2021-01-12: qty 2

## 2021-01-12 MED ORDER — PROSOURCE TF PO LIQD: 45.0000 mL | Freq: Two times a day (BID) | ORAL | Status: DC

## 2021-01-12 MED ORDER — PANTOPRAZOLE SODIUM 40 MG PO PACK
40.0000 mg | PACK | Freq: Every day | ORAL | Status: DC
  Administered 2021-01-12: 40 mg
  Filled 2021-01-12: qty 20

## 2021-01-12 MED ORDER — VITAL HIGH PROTEIN PO LIQD: 1000.0000 mL | ORAL | Status: DC

## 2021-01-12 MED ORDER — LACTATED RINGERS IV BOLUS
1000.0000 mL | Freq: Once | INTRAVENOUS | Status: AC
  Administered 2021-01-12: 1000 mL via INTRAVENOUS

## 2021-01-12 MED ORDER — VITAMIN D 25 MCG (1000 UNIT) PO TABS
2000.0000 [IU] | ORAL_TABLET | Freq: Two times a day (BID) | ORAL | Status: DC
  Administered 2021-01-12 – 2021-01-13 (×3): 2000 [IU]
  Filled 2021-01-12 (×3): qty 2

## 2021-01-12 MED ORDER — PROSOURCE TF PO LIQD
90.0000 mL | Freq: Two times a day (BID) | ORAL | Status: DC
  Administered 2021-01-12 – 2021-01-13 (×3): 90 mL
  Filled 2021-01-12 (×3): qty 90

## 2021-01-12 MED ORDER — LEVOTHYROXINE SODIUM 75 MCG PO TABS
75.0000 ug | ORAL_TABLET | Freq: Every day | ORAL | Status: DC
  Administered 2021-01-13: 75 ug
  Filled 2021-01-12: qty 1

## 2021-01-12 MED ORDER — VITAL HIGH PROTEIN PO LIQD
1000.0000 mL | ORAL | Status: DC
  Administered 2021-01-12 – 2021-01-13 (×2): 1000 mL

## 2021-01-12 MED ORDER — APIXABAN 2.5 MG PO TABS
2.5000 mg | ORAL_TABLET | Freq: Two times a day (BID) | ORAL | Status: DC
  Administered 2021-01-12 – 2021-01-13 (×3): 2.5 mg
  Filled 2021-01-12 (×3): qty 1

## 2021-01-12 MED ORDER — ORAL CARE MOUTH RINSE
15.0000 mL | OROMUCOSAL | Status: DC
  Administered 2021-01-12 – 2021-01-13 (×12): 15 mL via OROMUCOSAL

## 2021-01-12 NOTE — Progress Notes (Signed)
eLink Physician-Brief Progress Note Patient Name: Michael Randolph DOB: 08/16/1934 MRN: [id number]   Date of Service  01/12/2021  HPI/Events of Note  ABG on 100% BiLevel Phigh 38 and Plow  12 = 7.18/37.3/65.8/13.5  eICU Interventions  Plan: NaHCO3 100 meq IV now.  Increase NaHCO3 IV infusion to 125 mL/hour. Repeat ABG at 5 AM.     Intervention Category Major Interventions: Acid-Base disturbance - evaluation and management;Respiratory failure - evaluation and management  [name] 01/12/2021, 1:55 AM

## 2021-01-12 NOTE — Progress Notes (Signed)
Pt unproned at 1530

## 2021-01-12 NOTE — Progress Notes (Signed)
Date and time results received: 01/12/21 1020 (use smartphrase ".now" to insert current time)  Test: ABG pH Critical Value: 7.122  Name of Provider Notified: Dr. Melvyn Novas  Orders Received? Or Actions Taken?: Awaiting new orders

## 2021-01-12 NOTE — Progress Notes (Signed)
eLink Physician-Brief Progress Note Patient Name: Michael Randolph DOB: 1934/11/26 MRN: [id number]   Date of Service  01/12/2021  HPI/Events of Note  Hypokalemia - K+ = 3.1 and Creatinine = 2.9. Patient is on a Lasix IV infusion.   eICU Interventions  Will replace K+.     Intervention Category Major Interventions: Electrolyte abnormality - evaluation and management  Amina Menchaca Eugene 01/12/2021, 5:28 AM

## 2021-01-12 NOTE — Progress Notes (Addendum)
NAME:  Michael Randolph, MRN:  [id number], DOB:  [birth year], LOS: 2 ADMISSION DATE:  12/13/2020, CONSULTATION DATE:  01/11/21 REFERRING MD:  Reesa Chew, CHIEF COMPLAINT:  SOB   History of Present Illness:  86 year old man with hx of DM, CKD, DVT, urinary retention who presented with s/s of urosepsis, given fluids and decompensated.  Arrived to room to find him on 100% FiO2 BIPAP breathing in 50s with sats in 60s.  History per chart review after intubation.  note on macrodantin at admit but only got 2 doses    Pertinent  Medical History  CHF, EF 25-30% Chronic urinary retention self-caths CKD3a DVT on AC OSA TIA Stroke DM2 a1c 6%   Significant Hospital Events: Including procedures, antibiotic start and stop dates in addition to other pertinent events   6/30 admitted 6/30 BC x 1 >>> 6/30 UC >>> 6/30 reps viral panel >>> 7/1 respiratory failure fulminant 7/2 refractory shock with cvp 6  and autopeep present   Scheduled Meds:  acidophilus  1 capsule Oral Daily   apixaban  2.5 mg Oral BID   artificial tears  1 application Both Eyes E1E   chlorhexidine gluconate (MEDLINE KIT)  15 mL Mouth Rinse BID   Chlorhexidine Gluconate Cloth  6 each Topical Daily   cholecalciferol  2,000 Units Oral BID   docusate  100 mg Per Tube BID   fentaNYL (SUBLIMAZE) injection  25 mcg Intravenous Once   finasteride  5 mg Oral Daily   insulin aspart  0-15 Units Subcutaneous Q4H   ipratropium-albuterol  3 mL Nebulization Q6H   levothyroxine  75 mcg Oral Daily   mouth rinse  15 mL Mouth Rinse 10 times per day   pantoprazole (PROTONIX) IV  40 mg Intravenous QHS   polyethylene glycol  17 g Per Tube Daily   Continuous Infusions:  ceFEPime (MAXIPIME) IV 2 g (01/12/21 0551)   fentaNYL infusion INTRAVENOUS 80 mcg/hr (01/12/21 0415)   furosemide (LASIX) 200 mg in dextrose 5% 100 mL (11m/mL) infusion 10 mg/hr (01/12/21 0508)   midazolam 2.5 mg/hr (01/12/21 0553)   norepinephrine (LEVOPHED) Adult infusion 80  mcg/min (01/12/21 0426)   potassium chloride 10 mEq (01/12/21 0630)   propofol (DIPRIVAN) infusion Stopped (01/11/21 1727)    sodium bicarbonate (isotonic) infusion in sterile water 125 mL/hr at 01/12/21 0535   vasopressin 0.03 Units/min (01/12/21 0415)   PRN Meds:.acetaminophen **OR** acetaminophen, cisatracurium (NIMBEX) injection, fentaNYL, hydrALAZINE, ipratropium-albuterol, ondansetron **OR** ondansetron (ZOFRAN) IV     Interim History / Subjective:  Remains prone on heavy sedation/ max levophed and vasopressin with clear air trapping on present vent setting   Objective   Blood pressure (!) 114/93, pulse (!) 109, temperature 98.3 F (36.8 C), temperature source Axillary, resp. rate (!) 26, height _0  (1.702 m), weight 95.6 kg, SpO2 96 %.    Vent Mode: Bi-Vent FiO2 (%):  [100 %] 100 % Set Rate:  [18 bmp-28 bmp] 18 bmp Vt Set:  [520 mL-540 mL] 520 mL PEEP:  [12 cmH20] 12 cmH20 Plateau Pressure:  [33 cmH20] 33 cmH20   Intake/Output Summary (Last 24 hours) at 01/12/2021 0657 Last data filed at 01/12/2021 0415 Gross per 24 hour  Intake 1966.91 ml  Output 475 ml  Net 1491.91 ml   Filed Weights   01/06/2021 1000 12/30/2020 1700  Weight: 90.6 kg 95.6 kg   CVP:  [6 mmHg] 6 mmHg    Examination:  Tmax  102.5  General appearance:  prone elderly wm / heavily  sedated on versed/fentanyl   Oropharx with ET  Lungs with a few scattered exp > insp rhonchi bilaterally RRR no s3 or or sign murmur Abd soft Extr cool and slightly cyanotic  Neuro  no response to verbal, not breathing over vent   Resolved Hospital Problem list   N/a  Assessment & Plan:  Acute severe hypoxemic respiratory failure- in setting of AMS, urosepsis, baseline biventricular heart failure and fluid rescuscitation.  Leading differential would be pulmonary edema but aspiration/ARDS also a possibility.  AKI Lab Results  Component Value Date   CREATININE 2.90 (H) 01/12/2021   CREATININE 2.00 (H) 01/11/2021    CREATININE 1.56 (H) 01/11/2021  Probable UTI with sepsis - need to get hemodynamics restored and levo dose down   Circulatory shock / refractory to levo/vaso - PCT elevated but not that impressive so alternate possibilities need to be explored - autopeep needs to be eliminated and accurate cvp obtained > change to [facility]  - rx UTI sepsis with maxepime should be adequate       Best Practice (right click and "Reselect all SmartList Selections" daily)   Diet/type: NPO DVT prophylaxis: systemic heparin GI prophylaxis: PPI Lines: Central line Foley:  Yes, and it is still needed Code Status:  full code Last date of multidisciplinary goals of care discussion [7/1 full scope] Discussed with wife at bedside, note on macrodantin at admit but only got 2 doses   Labs   CBC: Recent Labs  Lab 12/14/2020 1059 01/11/21 0324 01/12/21 0338  WBC 21.1* 19.4* 22.4*  NEUTROABS 19.3*  --   --   HGB 14.5 13.4 16.5  HCT 44.4 41.1 52.2*  MCV 84.6 84.0 87.3  PLT 227 191 938    Basic Metabolic Panel: Recent Labs  Lab 12/12/2020 1059 01/11/21 0324 01/11/21 1539 01/12/21 0338  NA 134* 134* 133* 139  K 4.2 4.5 4.4 3.1*  CL 100 102 103 103  CO2 21* 24 19* 16*  GLUCOSE 282* 172* 220* 136*  BUN 24* 23 29* 36*  CREATININE 1.58* 1.56* 2.00* 2.90*  CALCIUM 9.4 8.5* 8.6* 7.8*  MG  --   --   --  1.9   GFR: Estimated Creatinine Clearance: 20.9 mL/min (A) (by C-G formula based on SCr of 2.9 mg/dL (H)). Recent Labs  Lab 12/20/2020 1059 12/20/2020 1210 01/08/2021 1902 01/11/21 0324 01/12/21 0338  PROCALCITON  --   --   --  3.45  --   WBC 21.1*  --   --  19.4* 22.4*  LATICACIDVEN 5.5* 4.2* 3.3*  --   --     Liver Function Tests: Recent Labs  Lab 12/28/2020 1059  AST 28  ALT 16  ALKPHOS 61  BILITOT 1.3*  PROT 7.5  ALBUMIN 4.0   No results for input(s): LIPASE, AMYLASE in the last 168 hours. No results for input(s): AMMONIA in the last 168 hours.  ABG    Component Value Date/Time   PHART  7.251 (L) 01/12/2021 0411   PCO2ART 35.5 01/12/2021 0411   PO2ART 76.7 (L) 01/12/2021 0411   HCO3 15.2 (L) 01/12/2021 0411   TCO2 25 12/01/2014 1631   ACIDBASEDEF 11.2 (H) 01/12/2021 0411   O2SAT 95.3 01/12/2021 0411     Coagulation Profile: Recent Labs  Lab 12/29/2020 1059 01/11/21 0324  INR 1.3* 1.8*    Cardiac Enzymes: No results for input(s): CKTOTAL, CKMB, CKMBINDEX, TROPONINI in the last 168 hours.  HbA1C: Hgb A1c MFr Bld  Date/Time Value Ref Range Status  12/25/2020  10:59 AM 6.3 (H) 4.8 - 5.6 % Final    Comment:    (NOTE)         Prediabetes: 5.7 - 6.4         Diabetes: >6.4         Glycemic control for adults with diabetes: <7.0   10/13/2020 12:02 AM 6.0 (H) 4.8 - 5.6 % Final    Comment:    (NOTE) Pre diabetes:          5.7%-6.4%  Diabetes:              >6.4%  Glycemic control for   <7.0% adults with diabetes     CBG: Recent Labs  Lab 01/11/21 1801 01/11/21 2035 01/11/21 2141 01/12/21 0057 01/12/21 0342  GLUCAP 176* 170* 128* 113* 116*      The patient is critically ill with multiple organ systems failure and requires high complexity decision making for assessment and support, frequent evaluation and titration of therapies, application of advanced monitoring technologies and extensive interpretation of multiple databases. Critical Care Time devoted to patient care services described in this note is 45 minutes.   Christinia Gully, MD Pul[facility] [place] [phone]   After 7:00 pm call Elink  [phone]

## 2021-01-12 NOTE — Progress Notes (Signed)
Notified Lab ABG being sent for analysis. 

## 2021-01-12 NOTE — Progress Notes (Signed)
eLink Physician-Brief Progress Note Patient Name: YIDA HYAMS DOB: 09/08/1934 MRN: [id number]   Date of Service  01/12/2021  HPI/Events of Note  Urine output on Lasix IV infusion only 250 mL for shift. Nursing feels lungs sound "wet".  eICU Interventions  Plan: Increase Lasix IV infusion to 10 mg/hour.     Intervention Category Major Interventions: Other:  Abigaelle Verley Cornelia Copa 01/12/2021, 4:04 AM

## 2021-01-12 NOTE — Progress Notes (Signed)
Pharmacy Antibiotic Note  Michael Randolph is a 86 y.o. male admitted on 12/13/2020 with UTI.  Pharmacy has been consulted for Cefepime dosing. 01/12/2021 D#3 cefepime Tm 102.5, WBC 22.4,  SCr up to 2.9, CrCl ~ 21 ml/min Pt intubated, on levo, vaso & lasix drip  Plan: Change to Cefepime 2g IV q24h Follow up renal function, culture results, and clinical course.   Height: 5\' 7"  (170.2 cm) Weight: 95.6 kg (210 lb 12.2 oz) IBW/kg (Calculated) : 66.1  Temp (24hrs), Avg:99.4 F (37.4 C), Min:96.4 F (35.8 C), Max:102.5 F (39.2 C)  Recent Labs  Lab 01/04/2021 1059 01/02/2021 1210 01/03/2021 1902 01/11/21 0324 01/11/21 1539 01/12/21 0338  WBC 21.1*  --   --  19.4*  --  22.4*  CREATININE 1.58*  --   --  1.56* 2.00* 2.90*  LATICACIDVEN 5.5* 4.2* 3.3*  --   --   --      Estimated Creatinine Clearance: 20.9 mL/min (A) (by C-G formula based on SCr of 2.9 mg/dL (H)).    Allergies  Allergen Reactions   Actos [Pioglitazone] Other (See Comments)    Slow HR and pain in face    Aloe Other (See Comments)    other   Anesthetics, Halogenated Other (See Comments)    Slept too long   Crestor [Rosuvastatin Calcium]     Pain    Sulfa Antibiotics Other (See Comments)    Hallucinations    Dye Fdc Red [Red Dye] Rash    # 71    Thank you for allowing pharmacy to be a part of this patient's care.  Antimicrobials this admission: 6/30 Ceftriaxone x1 6/30 Cefepime >>   Dose adjustments this admission: 7/2 Cefepime 2 q12>> 2q24 for CrCl 21 ml/min Microbiology results: 6/30 BCx: ngtd 6/30 UCx: sent  Eudelia Bunch, Pharm.D 01/12/2021 8:11 AM Clinical Pharmacist [facility] main pharmacy [phone] 01/12/2021 8:07 AM

## 2021-01-12 NOTE — Progress Notes (Signed)
Initial Nutrition Assessment  DOCUMENTATION CODES:   Obesity unspecified  INTERVENTION:   Initiate Vital High Protein @ 45 ml/hr via OGT  90 ml Prosource TF BID.    Tube feeding regimen provides 1240 kcal (100% of needs), 94 grams of protein, and 903 ml of H2O.    NUTRITION DIAGNOSIS:   Inadequate oral intake related to inability to eat as evidenced by NPO status.  GOAL:   Provide needs based on ASPEN/SCCM guidelines  MONITOR:   Vent status, Labs, Weight trends, TF tolerance, Skin, I & O's  REASON FOR ASSESSMENT:   Ventilator, Consult Enteral/tube feeding initiation and management  ASSESSMENT:   Michael Randolph is a 86 y.o. male with medical history significant for diabetes mellitus with complications of stage III chronic kidney disease, chronic combined systolic and diastolic dysfunction CHF, history of DVT, urinary retention and does straight caths at home who presents to the ER with his wife for evaluation of dysuria and fever.  Patient states that he gets frequent urinary tract infections and that 1 day prior to his admission he developed burning with micturition and so called his primary care provider.  He was prescribed nitrofurantoin and has taken 2 doses.  On the day of admission his wife states that he developed a fever with a T-max of 102 for which she administered Tylenol and upon his arrival to the ER his temp was 100.2.  She also states that he was tachycardic with heart rate in the 100's.  She attempted to call his primary care provider but when they did not respond she drove him to the emergency room.  Pt admitted with sepsis secondary to UTI.   7/1- rapid response, intubated secondary to labored breathing on Bi-pap  Patient is currently intubated on ventilator support. OGT placement verified by x-ray; currently connected to low, intermittent suction.  MV: 12.2 L/min Temp (24hrs), Avg:98.7 F (37.1 C), Min:96.4 F (35.8 C), Max:102.5 F (39.2 C)  MAP:  70  Reviewed I/O's: +1.5 L x 24 hours and +217 ml since admission  UOP: 475 ml x 24 hours  Pt unavailable at time of visit. RD unable to obtain further nutrition-related history or complete nutrition-focused physical exam at this time.    Per PCCM notes, pt family desiring full, aggressive care at this time.   Reviewed wt hx; wt has been stable over the past 2 months.  Medications reviewed and include vitamin D3, miralax, fentanyl, versed, levophed, and vasopressin.  Lab Results  Component Value Date   HGBA1C 6.3 (H) 01/08/2021   PTA DM medications are none.   Labs reviewed: CBGS: 009-233 (inpatient orders for glycemic control are 0-15 units insulin aspart every 4 hours).    Diet Order:   Diet Order             Diet Carb Modified Fluid consistency: Thin; Room service appropriate? Yes  Diet effective now                   EDUCATION NEEDS:   Not appropriate for education at this time  Skin:  Skin Assessment: Reviewed RN Assessment  Last BM:  01/11/21  Height:   Ht Readings from Last 1 Encounters:  01/11/21 5\' 7"  (1.702 m)    Weight:   Wt Readings from Last 1 Encounters:  01/04/2021 95.6 kg    Ideal Body Weight:  67.3 kg  BMI:  Body mass index is 33.01 kg/m.  Estimated Nutritional Needs:   Kcal:  0076-2263  Protein:  >  135 grams  Fluid:  > 1.2 L    Loistine Chance, RD, LDN, [place] Registered Dietitian II Certified Diabetes Care and Education Specialist Please refer to [facility] for RD and/or RD on-call/weekend/after hours pager

## 2021-01-12 NOTE — Progress Notes (Signed)
Removed cloth tape from PT face and around ETT to place PT in supine position (from prone position). PT face skin integrity WNL at this time. ETT resides at 26 cm lip supported by commercial ETT holder.  Arterial line has good waveform, draws blood and flushes well at this time.

## 2021-01-13 ENCOUNTER — Inpatient Hospital Stay (HOSPITAL_COMMUNITY): Payer: Medicare Other

## 2021-01-13 DIAGNOSIS — E872 Acidosis, unspecified: Secondary | ICD-10-CM | POA: Diagnosis present

## 2021-01-13 DIAGNOSIS — J9601 Acute respiratory failure with hypoxia: Secondary | ICD-10-CM

## 2021-01-13 DIAGNOSIS — Z7189 Other specified counseling: Secondary | ICD-10-CM

## 2021-01-13 DIAGNOSIS — Z515 Encounter for palliative care: Secondary | ICD-10-CM

## 2021-01-13 DIAGNOSIS — N390 Urinary tract infection, site not specified: Secondary | ICD-10-CM | POA: Diagnosis not present

## 2021-01-13 DIAGNOSIS — A419 Sepsis, unspecified organism: Secondary | ICD-10-CM | POA: Diagnosis not present

## 2021-01-13 DIAGNOSIS — I5042 Chronic combined systolic (congestive) and diastolic (congestive) heart failure: Secondary | ICD-10-CM | POA: Diagnosis not present

## 2021-01-13 DIAGNOSIS — J80 Acute respiratory distress syndrome: Secondary | ICD-10-CM | POA: Diagnosis not present

## 2021-01-13 LAB — BASIC METABOLIC PANEL
Anion gap: 27 — ABNORMAL HIGH (ref 5–15)
Anion gap: >20 — ABNORMAL HIGH (ref 5–15)
Anion gap: >20 — ABNORMAL HIGH (ref 5–15)
BUN: 48 mg/dL — ABNORMAL HIGH (ref 8–23)
BUN: 55 mg/dL — ABNORMAL HIGH (ref 8–23)
BUN: 58 mg/dL — ABNORMAL HIGH (ref 8–23)
CO2: 18 mmol/L — ABNORMAL LOW (ref 22–32)
CO2: 18 mmol/L — ABNORMAL LOW (ref 22–32)
CO2: 9 mmol/L — ABNORMAL LOW (ref 22–32)
Calcium: 7.3 mg/dL — ABNORMAL LOW (ref 8.9–10.3)
Calcium: 7 mg/dL — ABNORMAL LOW (ref 8.9–10.3)
Calcium: 6.6 mg/dL — ABNORMAL LOW (ref 8.9–10.3)
Chloride: 96 mmol/L — ABNORMAL LOW (ref 98–111)
Chloride: 94 mmol/L — ABNORMAL LOW (ref 98–111)
Chloride: 91 mmol/L — ABNORMAL LOW (ref 98–111)
Creatinine, Ser: 3.5 mg/dL — ABNORMAL HIGH (ref 0.61–1.24)
Creatinine, Ser: 3.82 mg/dL — ABNORMAL HIGH (ref 0.61–1.24)
Creatinine, Ser: 4.3 mg/dL — ABNORMAL HIGH (ref 0.61–1.24)
GFR, Estimated: 17 mL/min — ABNORMAL LOW (ref >60)
GFR, Estimated: 15 mL/min — ABNORMAL LOW (ref >60)
GFR, Estimated: 13 mL/min — ABNORMAL LOW (ref >60)
Glucose, Bld: 131 mg/dL — ABNORMAL HIGH (ref 70–99)
Glucose, Bld: 102 mg/dL — ABNORMAL HIGH (ref 70–99)
Glucose, Bld: 62 mg/dL — ABNORMAL LOW (ref 70–99)
Potassium: 4.3 mmol/L (ref 3.5–5.1)
Potassium: 5 mmol/L (ref 3.5–5.1)
Potassium: 6.6 mmol/L (ref 3.5–5.1)
Sodium: 141 mmol/L (ref 135–145)
Sodium: 138 mmol/L (ref 135–145)
Sodium: 137 mmol/L (ref 135–145)

## 2021-01-13 LAB — CBC
HCT: 44.5 % (ref 39.0–52.0)
Hemoglobin: 14.5 g/dL (ref 13.0–17.0)
MCH: 27.1 pg (ref 26.0–34.0)
MCHC: 32.6 g/dL (ref 30.0–36.0)
MCV: 83.2 fL (ref 80.0–100.0)
Platelets: 148 10*3/uL — ABNORMAL LOW (ref 150–400)
RBC: 5.35 MIL/uL (ref 4.22–5.81)
RDW: 14 % (ref 11.5–15.5)
WBC: 15.1 10*3/uL — ABNORMAL HIGH (ref 4.0–10.5)
nRBC: 0.5 % — ABNORMAL HIGH (ref 0.0–0.2)

## 2021-01-13 LAB — BLOOD GAS, ARTERIAL
Acid-base deficit: 5 mmol/L — ABNORMAL HIGH (ref 0.0–2.0)
Acid-base deficit: 10.3 mmol/L — ABNORMAL HIGH (ref 0.0–2.0)
Acid-base deficit: 16.5 mmol/L — ABNORMAL HIGH (ref 0.0–2.0)
Bicarbonate: 18.5 mmol/L — ABNORMAL LOW (ref 20.0–28.0)
Bicarbonate: 16.6 mmol/L — ABNORMAL LOW (ref 20.0–28.0)
Bicarbonate: 12.5 mmol/L — ABNORMAL LOW (ref 20.0–28.0)
Drawn by: 25770
Drawn by: 25770
FIO2: 80
FIO2: 100
FIO2: 100
MECHVT: 550 mL
MECHVT: 500 mL
O2 Saturation: 91.2 %
O2 Saturation: 90.8 %
O2 Saturation: 83.5 %
PEEP: 14 cmH2O
PEEP: 14 cmH2O
Patient temperature: 102
Patient temperature: 100.3
Patient temperature: 100.3
RATE: 24 resp/min
RATE: 30 resp/min
pCO2 arterial: 34.7 mmHg (ref 32.0–48.0)
pCO2 arterial: 43.1 mmHg (ref 32.0–48.0)
pCO2 arterial: 43.1 mmHg (ref 32.0–48.0)
pH, Arterial: 7.358 (ref 7.350–7.450)
pH, Arterial: 7.216 — ABNORMAL LOW (ref 7.350–7.450)
pH, Arterial: 7.096 — CL (ref 7.350–7.450)
pO2, Arterial: 73.4 mmHg — ABNORMAL LOW (ref 83.0–108.0)
pO2, Arterial: 80.7 mmHg — ABNORMAL LOW (ref 83.0–108.0)
pO2, Arterial: 71.5 mmHg — ABNORMAL LOW (ref 83.0–108.0)

## 2021-01-13 LAB — GLUCOSE, CAPILLARY
Glucose-Capillary: 97 mg/dL (ref 70–99)
Glucose-Capillary: 64 mg/dL — ABNORMAL LOW (ref 70–99)
Glucose-Capillary: 54 mg/dL — ABNORMAL LOW (ref 70–99)
Glucose-Capillary: 76 mg/dL (ref 70–99)
Glucose-Capillary: 102 mg/dL — ABNORMAL HIGH (ref 70–99)
Glucose-Capillary: 67 mg/dL — ABNORMAL LOW (ref 70–99)

## 2021-01-13 LAB — PHOSPHORUS: Phosphorus: 5.9 mg/dL — ABNORMAL HIGH (ref 2.5–4.6)

## 2021-01-13 LAB — URINE CULTURE: Culture: 50000 — AB

## 2021-01-13 LAB — MAGNESIUM: Magnesium: 2 mg/dL (ref 1.7–2.4)

## 2021-01-13 LAB — MRSA NEXT GEN BY PCR, NASAL: MRSA by PCR Next Gen: NOT DETECTED

## 2021-01-13 MED ORDER — FUROSEMIDE 10 MG/ML IJ SOLN
60.0000 mg | Freq: Once | INTRAMUSCULAR | Status: AC
  Administered 2021-01-13: 60 mg via INTRAVENOUS
  Filled 2021-01-13: qty 6

## 2021-01-13 MED ORDER — DEXTROSE 50 % IV SOLN
12.5000 g | INTRAVENOUS | Status: AC
Start: 2021-01-13 — End: 2021-01-13
  Administered 2021-01-13: 12.5 g via INTRAVENOUS
  Filled 2021-01-13: qty 50

## 2021-01-13 MED ORDER — DEXTROSE 50 % IV SOLN
12.5000 g | INTRAVENOUS | Status: AC
Start: 2021-01-13 — End: 2021-01-13
  Administered 2021-01-13: 12.5 g via INTRAVENOUS

## 2021-01-13 MED ORDER — SODIUM CHLORIDE 0.9% FLUSH
10.0000 mL | Freq: Two times a day (BID) | INTRAVENOUS | Status: DC
  Administered 2021-01-13: 30 mL

## 2021-01-13 MED ORDER — LINEZOLID 600 MG/300ML IV SOLN
600.0000 mg | Freq: Two times a day (BID) | INTRAVENOUS | Status: DC
  Administered 2021-01-13: 600 mg via INTRAVENOUS
  Filled 2021-01-13 (×3): qty 300

## 2021-01-13 MED ORDER — DEXTROSE 50 % IV SOLN
INTRAVENOUS | Status: AC
  Filled 2021-01-13: qty 50

## 2021-01-13 MED ORDER — MIDAZOLAM HCL 2 MG/2ML IJ SOLN: 1.0000 mg | INTRAMUSCULAR | Status: DC | PRN

## 2021-01-13 MED ORDER — MIDAZOLAM 50MG/50ML (1MG/ML) PREMIX INFUSION: 0.5000 mg/h | INTRAVENOUS | Status: DC

## 2021-01-13 MED ORDER — DEXTROSE 50 % IV SOLN
25.0000 g | INTRAVENOUS | Status: AC
Start: 2021-01-13 — End: 2021-01-13
  Administered 2021-01-13: 25 g via INTRAVENOUS

## 2021-01-13 MED ORDER — SODIUM CHLORIDE 0.9% FLUSH: 10.0000 mL | INTRAVENOUS | Status: DC | PRN

## 2021-01-15 LAB — CULTURE, BLOOD (SINGLE)
Culture: NO GROWTH
Special Requests: ADEQUATE

## 2021-01-30 ENCOUNTER — Encounter (HOSPITAL_COMMUNITY): Payer: Medicare Other | Admitting: Internal Medicine

## 2021-02-11 NOTE — Consult Note (Signed)
Consultation Note Date: 2021-02-04   Patient Name: Michael Randolph  DOB: 1934/11/15  MRN: [id number]  Age / Sex: 86 y.o., male  PCP: Shon Baton, MD Referring Physician: Tanda Rockers, MD  Reason for Consultation: Terminal Care  HPI/Patient Profile: 86 y.o. male  with past medical history of diabetes, CKD, DVT, urinary retention admitted on 01/03/2021 with urosepsis.  Unfortunately, he has decompensated this admission and is currently intubated but suffering from worsened respiratory failure secondary to clinical picture consistent with ARDS.  He is septic and acidotic with widening QRS.  Prognosis is likely hours at this point and palliative care consulted for goals of care/support for family.  clinical Assessment and Goals of Care: I met today with Mr. Steuber wife and son. We discussed clinical course and the fact that he is actively dying.  We discussed difference between a aggressive medical intervention path and a palliative, comfort focused care path.  Values and goals of care important to patient and family were attempted to be elicited.  At this point, family is hopeful to have other family be able to come and see him before he dies.   Questions and concerns addressed.   PMT will continue to support holistically.  SUMMARY OF RECOMMENDATIONS   - DNR.  No escalation of care. - Discussed with family concern that his prognosis at this point is likely hours. - Continue current interventions without escalation of care to hopefully allow family the opportunity to visit.   - Visitation per end of life policy. - Continue medications as needed to ensure comfort.  Additional Recommendations: Grief/Bereavement Support  Prognosis:  Hours - Days  Discharge Planning: Anticipated Hospital Death      Primary Diagnoses: Present on Admission:  Sepsis secondary to UTI ([place])  CKD stage 3 due to type 2 diabetes  mellitus (HCC)  Chronic combined systolic and diastolic CHF (congestive heart failure) (HCC)  Hypothyroidism  Acute respiratory failure with hypoxemia (HCC)  Metabolic acidosis  AKI (acute kidney injury) ([place])   I have reviewed the medical record, interviewed the patient and family, and examined the patient. The following aspects are pertinent.  Past Medical History:  Diagnosis Date   Congestive heart failure (CHF) (HCC)    Diabetes mellitus without complication (HCC)    no medications   Diverticulosis    DVT (deep venous thrombosis) (HCC)    Heart disease    Hyperlipemia    Kidney stone    NICM (nonischemic cardiomyopathy) (HCC)    OSA (obstructive sleep apnea)    Stroke ([place])    TIA (transient ischemic attack) 2008   was seen on CT    Toe infection    UTI (urinary tract infection) 10/12/2020   Social History   Socioeconomic History   Marital status: Married    Spouse name: Not on file   Number of children: 3   Years of education: Associates   Highest education level: Not on file  Occupational History   Occupation: Retired   Tobacco Use   Smoking  status: Never   Smokeless tobacco: Never  Vaping Use   Vaping Use: Never used  Substance and Sexual Activity   Alcohol use: No    Alcohol/week: 0.0 standard drinks   Drug use: No   Sexual activity: Yes  Other Topics Concern   Not on file  Social History Narrative   Drinks 1-2 cups of coffee a day    Social Determinants of Health   Financial Resource Strain: Not on file  Food Insecurity: Not on file  Transportation Needs: Not on file  Physical Activity: Not on file  Stress: Not on file  Social Connections: Not on file   Family History  Problem Relation Age of Onset   Stroke Mother    Hypertension Mother    Heart attack Father    Deep vein thrombosis Father    Crohn's disease Brother    Schizophrenia Sister    Scheduled Meds:  apixaban  2.5 mg Per Tube BID   artificial tears  1 application Both Eyes C3U    chlorhexidine gluconate (MEDLINE KIT)  15 mL Mouth Rinse BID   Chlorhexidine Gluconate Cloth  6 each Topical Daily   cholecalciferol  2,000 Units Per Tube BID   docusate  100 mg Per Tube BID   feeding supplement (PROSource TF)  90 mL Per Tube BID   feeding supplement (VITAL HIGH PROTEIN)  1,000 mL Per Tube Q24H   insulin aspart  0-15 Units Subcutaneous Q4H   ipratropium-albuterol  3 mL Nebulization Q6H   levothyroxine  75 mcg Per Tube Daily   mouth rinse  15 mL Mouth Rinse 10 times per day   pantoprazole sodium  40 mg Per Tube QHS   polyethylene glycol  17 g Per Tube Daily   sodium chloride flush  10-40 mL Intracatheter Q12H   Continuous Infusions:  ceFEPime (MAXIPIME) IV Stopped (15-Jan-2021 0600)   fentaNYL infusion INTRAVENOUS 200 mcg/hr (15-Jan-2021 1700)   linezolid (ZYVOX) IV Stopped (01/15/21 0940)   midazolam 10 mg/hr (Jan 15, 2021 1700)   norepinephrine (LEVOPHED) Adult infusion 76 mcg/min (01-15-21 1700)    sodium bicarbonate (isotonic) infusion in sterile water 150 mL/hr at 2021/01/15 1727   vasopressin 0.03 Units/min (01-15-21 1700)   PRN Meds:.acetaminophen **OR** acetaminophen, cisatracurium (NIMBEX) injection, fentaNYL, hydrALAZINE, ipratropium-albuterol, midazolam, ondansetron **OR** ondansetron (ZOFRAN) IV, sodium chloride flush Medications Prior to Admission:  Prior to Admission medications   Medication Sig Start Date End Date Taking? Authorizing Provider  apixaban (ELIQUIS) 2.5 MG TABS tablet Take 2.5 mg by mouth 2 (two) times daily.   Yes [provider]  Cholecalciferol 2000 UNITS CAPS Take 2,000 Units by mouth 2 (two) times daily.    Yes [provider]  eplerenone (INSPRA) 25 MG tablet Take 0.5 tablets (12.5 mg total) by mouth daily. 10/26/20  Yes Bensimhon, Shaune Pascal, MD  finasteride (PROSCAR) 5 MG tablet Take 5 mg by mouth daily.   Yes [provider]  GARLIC PO Take 1 tablet by mouth daily.   Yes [provider]  levothyroxine  (SYNTHROID) 75 MCG tablet Take 75 mcg by mouth daily. 09/05/20  Yes [provider]  losartan (COZAAR) 25 MG tablet Take 1 tablet daily Patient taking differently: Take 12.5 mg by mouth daily. Take 1 tablet daily 10/24/20  Yes Bensimhon, Shaune Pascal, MD  MAGNESIUM PO Take 1 tablet by mouth 2 (two) times daily.    Yes [provider]  Methylsulfonylmethane (MSM) 1000 MG CAPS Take 1,000 mg by mouth daily.    Yes  [provider]  nitrofurantoin, macrocrystal-monohydrate, (MACROBID) 100 MG capsule Take 100 mg by mouth 2 (two) times daily. Start date : 01/09/21 01/09/21  Yes [provider]  Probiotic Product (PROBIOTIC-10 PO) Take 1 tablet by mouth daily.   Yes [provider]  sodium chloride (OCEAN) 0.65 % SOLN nasal spray Place 1 spray into both nostrils daily as needed for congestion.   Yes [provider]  torsemide (DEMADEX) 20 MG tablet Take 1 tablet (20 mg total) by mouth daily. 12/06/20  Yes Bensimhon, Shaune Pascal, MD   Allergies  Allergen Reactions   Actos [Pioglitazone] Other (See Comments)    Slow HR and pain in face    Aloe Other (See Comments)    other   Anesthetics, Halogenated Other (See Comments)    Slept too long   Crestor [Rosuvastatin Calcium]     Pain    Sulfa Antibiotics Other (See Comments)    Hallucinations    Dye Fdc Red [Red Dye] Rash    # 40   Review of Systems Unable to obtain  Physical Exam Gen: Prone, ET tube in place CV: tachy with abnormal waveform Ext: cool and mottled, blue  Vital Signs: BP 131/77   Pulse (!) 56   Temp (!) 97.3 F (36.3 C) (Oral)   Resp (!) 30   Ht _0  (1.702 m)   Wt 95.6 kg   SpO2 (!) 83%   BMI 33.01 kg/m  Pain Scale: CPOT POSS *See Group Information*: S-Acceptable,Sleep, easy to arouse Pain Score: 0-No pain   SpO2: SpO2: (!) 83 % O2 Device:SpO2: (!) 83 % O2 Flow Rate: .O2 Flow Rate (L/min): 3 L/min  IO: Intake/output summary:  Intake/Output Summary (Last 24 hours) at  01/28/21 1807 Last data filed at 01-28-2021 1700 Gross per 24 hour  Intake 6150.79 ml  Output 180 ml  Net 5970.79 ml    LBM: Last BM Date: 01/11/21 Baseline Weight: Weight: 90.6 kg Most recent weight: Weight: 95.6 kg     Palliative Assessment/Data:   Flowsheet Rows    Flowsheet Row Most Recent Value  Intake Tab   Referral Department Critical care  Unit at Time of Referral ICU  Palliative Care Primary Diagnosis Sepsis/Infectious Disease  Date Notified 01-28-21  Palliative Care Type New Palliative care  Reason for referral End of Life Care Assistance  Date of Admission 12/23/2020  Date first seen by Palliative Care January 28, 2021  # of days Palliative referral response time 0 Day(s)  # of days IP prior to Palliative referral 3  Clinical Assessment   Palliative Performance Scale Score 10%  Psychosocial & Spiritual Assessment   Palliative Care Outcomes   Patient/Family meeting held? Yes  Who was at the meeting? Wife and son       Time Total: 50 minutes Greater than 50%  of this time was spent counseling and coordinating care related to the above assessment and plan.  Signed by: Micheline Rough, MD   Please contact Palliative Medicine Team phone at [phone] for questions and concerns.  For individual provider: See Shea Evans

## 2021-02-11 NOTE — Plan of Care (Signed)
  Problem: Education: Goal: Knowledge of General Education information will improve Description: Including pain rating scale, medication(s)/side effects and non-pharmacologic comfort measures Outcome: Not Progressing   Problem: Nutrition: Goal: Adequate nutrition will be maintained Outcome: Progressing

## 2021-02-11 NOTE — Progress Notes (Signed)
Notified MD that P02 on 100% Fi02 and 14 PEEP = 71. MD does not want PEEP increased due to BP 57/49.

## 2021-02-11 NOTE — Progress Notes (Signed)
Patient was proned at 2345. ETT tube secured with cloth tape. No issues noted

## 2021-02-11 NOTE — Death Summary Note (Signed)
Physician Discharge Summary         Patient ID: Michael Randolph MRN: [id number] DOB/AGE: [birth year] 86 y.o.  Admit date: 01/05/2021 Discharge date: Jan 31, 2021  Discharge Diagnoses:    1)  Refractory septic shock complicated by acute renal failure, metabolic acidosis and hypoxemic respiratory failure   2) Probable enteroccal uti  - renal U/s 7/2 neg for obstruction or abscess   Patient Active Problem List   Diagnosis Date Noted   Metabolic acidosis 64/15/8309   Acute respiratory failure with hypoxemia ([place]) 01/12/2021   Sepsis secondary to UTI ([place]) 01/08/2021   CKD stage 3 due to type 2 diabetes mellitus ([place]) 12/27/2020   CKD (chronic kidney disease) stage 3, GFR 30-59 ml/min (HCC) 10/12/2020   History of DVT (deep vein thrombosis) 10/12/2020   Hypothyroidism 10/12/2020   Acute delirium 08/09/2017   NICM (nonischemic cardiomyopathy) ([place]) 08/09/2017   Acute lower UTI 08/09/2017   Frequent PVCs    Chronic combined systolic and diastolic CHF (congestive heart failure) ([place]) 07/25/2017   Mixed hyperlipidemia 03/11/2017   NSVT (nonsustained ventricular tachycardia) ([place]) 05/19/2016   Acute on chronic systolic (congestive) heart failure ([place]) 05/19/2016   Sepsis ([place]) 05/16/2016   CAP (community acquired pneumonia) 05/16/2016   AKI (acute kidney injury) ([place]) 05/16/2016   Elevated troponin    NSTEMI (non-ST elevated myocardial infarction) ([place])    Diabetes mellitus without complication ([place]) 40/76/8088   DVT (deep venous thrombosis) (HCC)    TIA (transient ischemic attack)    Summary  86 year old man with hx of DM, CKD, DVT, urinary retention who presented with s/s of urosepsis, given fluids and decompensated.   PCCM Arrived to room to find him on 100% FiO2 BIPAP breathing in 50s with sats in 60s.  History per chart review after intubation.   note on macrodantin at admit but only got 2 doses per wife  (enteroccus sensitive to [employer])   Despite treatment with  empirical antibiotics, multiple pressors, full vent support, fluid resuscitation to adequate cvp, and bicarb drip pt had steady downhill course over 48 hours and was made NCB by family when apparent that further escalation of care would be futile  Seen terminally also by palliative care/ blood cultures remained negative.    He expired while full support the evening of 7/3 with fm at bedside   Christinia Gully, MD Pulmonary and [place] [phone]   After 7:00 pm call Elink  [phone]

## 2021-02-11 NOTE — Progress Notes (Signed)
Patient no longer had any spontaneous respirations and a heart beat could not be auscultated.  Patient was asystole on monitor.  Attending physician made aware .  Family at bedside.  Time of death was 10 and pronounced by Erskine Squibb and Jacqulyn Ducking.

## 2021-02-11 NOTE — Progress Notes (Signed)
Notified Lab that ABG being sent for analysis. 

## 2021-02-11 NOTE — Progress Notes (Signed)
RT took patient off the ventilator per family request.

## 2021-02-11 NOTE — Progress Notes (Signed)
NAME:  Michael Randolph, MRN:  [id number], DOB:  Aug 27, 1934, LOS: 3 ADMISSION DATE:  01/09/2021, CONSULTATION DATE:  01/11/21 REFERRING MD:  Reesa Chew, CHIEF COMPLAINT:  SOB   History of Present Illness:  86 year old man with hx of DM, CKD, DVT, urinary retention who presented with s/s of urosepsis, given fluids and decompensated.  Arrived to room to find him on 100% FiO2 BIPAP breathing in 50s with sats in 60s.  History per chart review after intubation.  note on macrodantin at admit but only got 2 doses per wife    Pertinent  Medical History  CHF, EF 25-30% Chronic urinary retention self-caths CKD3a DVT on AC OSA TIA Stroke DM2 a1c 6%   Significant Hospital Events: Including procedures, antibiotic start and stop dates in addition to other pertinent events   6/30 admitted 6/30 BC x 1 >>> 6/30 UC   enterococcus  50 k  6/30 reps viral panel >>> 7/1 respiratory failure fulminant 7/2 refractory shock with cvp 6  and autopeep present 7/3 autopeep eliminated still in shock / anuric  7/3 renal u/s    Scheduled Meds:  apixaban  2.5 mg Per Tube BID   artificial tears  1 application Both Eyes U8H   chlorhexidine gluconate (MEDLINE KIT)  15 mL Mouth Rinse BID   Chlorhexidine Gluconate Cloth  6 each Topical Daily   cholecalciferol  2,000 Units Per Tube BID   dextrose  12.5 g Intravenous STAT   docusate  100 mg Per Tube BID   feeding supplement (PROSource TF)  90 mL Per Tube BID   feeding supplement (VITAL HIGH PROTEIN)  1,000 mL Per Tube Q24H   insulin aspart  0-15 Units Subcutaneous Q4H   ipratropium-albuterol  3 mL Nebulization Q6H   levothyroxine  75 mcg Per Tube Daily   mouth rinse  15 mL Mouth Rinse 10 times per day   pantoprazole sodium  40 mg Per Tube QHS   polyethylene glycol  17 g Per Tube Daily   Continuous Infusions:  ceFEPime (MAXIPIME) IV Stopped (February 06, 2021 0600)   fentaNYL infusion INTRAVENOUS 200 mcg/hr (06-Feb-2021 0549)   linezolid (ZYVOX) IV     midazolam 6 mg/hr  (06-Feb-2021 0348)   norepinephrine (LEVOPHED) Adult infusion 70 mcg/min (2021-02-06 0626)    sodium bicarbonate (isotonic) infusion in sterile water 150 mL/hr at 02/06/2021 0131   vasopressin 0.03 Units/min (01/12/21 2254)   PRN Meds:.acetaminophen **OR** acetaminophen, cisatracurium (NIMBEX) injection, fentaNYL, hydrALAZINE, ipratropium-albuterol, midazolam, ondansetron **OR** ondansetron (ZOFRAN) IV     Interim History / Subjective:  Was breathing over back up rate on versed 7 and fentanyl 200 so req nimbex  Still on bicarb drip at 150 and max levophed / vasopressin   Objective   Blood pressure 131/77, pulse (!) 123, temperature (!) 101.8 F (38.8 C), temperature source Axillary, resp. rate (!) 35, height _0  (1.702 m), weight 95.6 kg, SpO2 (!) 88 %. CVP:  [7 mmHg-12 mmHg] 12 mmHg  Vent Mode: PRVC FiO2 (%):  [80 %-100 %] 100 % Set Rate:  [24 bmp] 24 bmp Vt Set:  [550 mL] 550 mL PEEP:  [12 cmH20-14 cmH20] 14 cmH20 Plateau Pressure:  [28 cmH20-38 cmH20] 38 cmH20   Intake/Output Summary (Last 24 hours) at 2021/02/06 0819 Last data filed at 2021/02/06 0600 Gross per 24 hour  Intake 6112.77 ml  Output 380 ml  Net 5732.77 ml   Filed Weights   12/13/2020 1000 01/08/2021 1700  Weight: 90.6 kg 95.6 kg   CVP:  [  7 mmHg-12 mmHg] 12 mmHg    Examination:  Tmax  101.8  General appearance:    prone, blue feet    Oropharynx et/ og  Neck supple Lungs with a few scattered exp > insp rhonchi bilaterally Cardiac   ST at 128  Abd obese with limited excursion  Extr cool/ mottled / blue feet  Neuro  Sensorium sedated/ paralyzed     I personally reviewed images and agree with radiology impression as follows:  CXR:   portable  7/3 Poor aeration R worse than L / et ok   Resolved Hospital Problem list   N/a  Assessment & Plan:  Acute severe hypoxemic respiratory failure- in setting of AMS, urosepsis, baseline biventricular heart failure and fluid rescuscitation with low cvp c/w ARDS  >>>  increase rate as long as doesn't air trap and reduce Vt toward goal of 6 cc /kg if tolerated  AKI/ metabolic acidosis c/w sepsis  Lab Results  Component Value Date   CREATININE 3.82 (H) 2021/01/31   CREATININE 3.50 (H) 01/12/2021   CREATININE 3.47 (H) 01/12/2021  Secondary to septic shock, poor perfusion and high pressor dep and now ? Anuric ?  >>> renal u/s , continue with hc03 drip     Circulatory shock / refractory to levo/vaso c/w sepsis, low cvp/ autopeep issues  - Maxepime 6/30 >>> - Zyvox 7/3 >>>  - 7/2:  by cvp probably vol repleted at this point and gas exchange worse / autopeep eliminated      He has been extremely unstable since admit with now worsening gas exchange and still pressor dep shock and was initially not felt likely to survive the first 24 hours and still prognosis is extremely poor   Agreed with wife/son to treat the treatable but no additional interventions / CPR         Best Practice (right click and "Reselect all SmartList Selections" daily)   Diet/type: NPO DVT prophylaxis: systemic heparin GI prophylaxis: PPI Lines: Central line Foley:  Yes, and it is still needed Code Status:  full code Last date of multidisciplinary goals of care discussion [7/1 full scope] Discussed with wife at bedside, note on macrodantin at admit but only got 2 doses   Labs   CBC: Recent Labs  Lab 12/18/2020 1059 01/11/21 0324 01/12/21 0338 2021/01/31 0525  WBC 21.1* 19.4* 22.4* 15.1*  NEUTROABS 19.3*  --   --   --   HGB 14.5 13.4 16.5 14.5  HCT 44.4 41.1 52.2* 44.5  MCV 84.6 84.0 87.3 83.2  PLT 227 191 197 148*    Basic Metabolic Panel: Recent Labs  Lab 01/12/21 0338 01/12/21 0635 01/12/21 1451 01/12/21 1453 01/12/21 1700 01/12/21 2250 Jan 31, 2021 0525  NA 139 141 141  --   --  141 138  K 3.1* 4.0 4.0  --   --  4.3 5.0  CL 103 100 99  --   --  96* 94*  CO2 16* 13* 17*  --   --  18* 18*  GLUCOSE 136* 142* 144*  --   --  131* 102*  BUN 36* 40* 43*  --   --   48* 55*  CREATININE 2.90* 3.19* 3.47*  --   --  3.50* 3.82*  CALCIUM 7.8* 8.1* 7.6*  --   --  7.3* 7.0*  MG 1.9  --   --   --   --   --  2.0  PHOS  --   --   --  6.8* 7.1*  --  5.9*   GFR: Estimated Creatinine Clearance: 15.9 mL/min (A) (by C-G formula based on SCr of 3.82 mg/dL (H)). Recent Labs  Lab 12/18/2020 1059 12/23/2020 1210 12/26/2020 1902 01/11/21 0324 01/12/21 0338 01/18/2021 0525  PROCALCITON  --   --   --  3.45  --   --   WBC 21.1*  --   --  19.4* 22.4* 15.1*  LATICACIDVEN 5.5* 4.2* 3.3*  --   --   --     Liver Function Tests: Recent Labs  Lab 12/12/2020 1059  AST 28  ALT 16  ALKPHOS 61  BILITOT 1.3*  PROT 7.5  ALBUMIN 4.0   No results for input(s): LIPASE, AMYLASE in the last 168 hours. No results for input(s): AMMONIA in the last 168 hours.  ABG    Component Value Date/Time   PHART 7.216 (L) 01/18/2021 0738   PCO2ART 43.1 2021-01-18 0738   PO2ART 80.7 (L) Jan 18, 2021 0738   HCO3 16.6 (L) 01/18/21 0738   TCO2 25 12/01/2014 1631   ACIDBASEDEF 10.3 (H) 01-18-21 0738   O2SAT 90.8 Jan 18, 2021 0738     Coagulation Profile: Recent Labs  Lab 12/22/2020 1059 01/11/21 0324  INR 1.3* 1.8*    Cardiac Enzymes: No results for input(s): CKTOTAL, CKMB, CKMBINDEX, TROPONINI in the last 168 hours.  HbA1C: Hgb A1c MFr Bld  Date/Time Value Ref Range Status  12/26/2020 10:59 AM 6.3 (H) 4.8 - 5.6 % Final    Comment:    (NOTE)         Prediabetes: 5.7 - 6.4         Diabetes: >6.4         Glycemic control for adults with diabetes: <7.0   10/13/2020 12:02 AM 6.0 (H) 4.8 - 5.6 % Final    Comment:    (NOTE) Pre diabetes:          5.7%-6.4%  Diabetes:              >6.4%  Glycemic control for   <7.0% adults with diabetes     CBG: Recent Labs  Lab 01/12/21 1602 01/12/21 2013 01/12/21 2316 18-Jan-2021 0358 2021/01/18 0753  GLUCAP 125* 101* 106* 97 64*      The patient is critically ill with multiple organ systems failure and requires high complexity  decision making for assessment and support, frequent evaluation and titration of therapies, application of advanced monitoring technologies and extensive interpretation of multiple databases. Critical Care Time devoted to patient care services described in this note is 60 minutes.   Christinia Gully, MD Pulmonary and [place] [phone]   After 7:00 pm call Elink  [phone]

## 2021-02-11 NOTE — Progress Notes (Signed)
ECG rhythm changes noted on monitor. 12 Lead Posterior EKG preformed as patient is proned. Critical test reported to Melvyn Novas, MD. Discussed current vital signs and EKG results with physician. No new orders received at this time MD states test does not change current course of treatment pt is critically ill and DNR.

## 2021-02-11 NOTE — Progress Notes (Addendum)
eLink Physician-Brief Progress Note Patient Name: Michael Randolph DOB: 1935-04-18 MRN: [id number]   Date of Service  January 17, 2021  HPI/Events of Note  Hypoxia - Sat now = 84%, FiO2 increased from 80% to 100%. Review of portable CXR reveals progression of diffuse opacifications and ETT at level of carina or even at mouth of L mainstem bronchus by my reading. Nursing unable to get reliable CVP d/t prone positioning. Creatinine - 3.5.  eICU Interventions  Plan: Withdraw ETT by 2 cm and re-secure. Increase PEEP to 14. Lasix 60 mg X 1 now.      Intervention Category Major Interventions: Respiratory failure - evaluation and management;Hypoxemia - evaluation and management  Michael Randolph 01-17-2021, 6:18 AM

## 2021-02-11 NOTE — Progress Notes (Signed)
Patient transported to the morgue at 2230.

## 2021-02-11 NOTE — Progress Notes (Signed)
eLink Physician-Brief Progress Note Patient Name: Michael Randolph DOB: 02/27/1935 MRN: [id number]   Date of Service  01/24/21  HPI/Events of Note  Patient is overbreathing set mechanical ventilator rate. Already on a Fentanyl IV infusion.   eICU Interventions  Plan: Versed 1-2 mg IV Q 2 hours PRN agitation or sedation.      Intervention Category Major Interventions: Respiratory failure - evaluation and management  Juelle Dickmann Cornelia Copa 2021-01-24, 6:37 AM

## 2021-02-11 NOTE — Progress Notes (Signed)
Arterial line has good waveform , draws blood and flushed well at this time.

## 2021-02-11 NOTE — Progress Notes (Signed)
Main problem now is refractory shock with severe metabolic acidosis, anuric renal failure and worsening hyperkalemia  - fm desires no escalation of care.   Michael Gully, MD Pulmonary and [place] [phone]   After 7:00 pm call Elink  [phone]

## 2021-02-11 NOTE — Progress Notes (Signed)
Notified Lab that ABG being sent for analysis,

## 2021-02-11 DEATH — deceased
